# Patient Record
Sex: Female | Born: 1990 | Race: White | Hispanic: No | Marital: Married | Smoking: Never smoker
Health system: Southern US, Community
[De-identification: ages and names within clinical notes are randomized; demographics above are authoritative.]

## PROBLEM LIST (undated history)

## (undated) DIAGNOSIS — R51 Headache: Secondary | ICD-10-CM

## (undated) DIAGNOSIS — F329 Major depressive disorder, single episode, unspecified: Secondary | ICD-10-CM

## (undated) DIAGNOSIS — G4700 Insomnia, unspecified: Secondary | ICD-10-CM

## (undated) DIAGNOSIS — G43009 Migraine without aura, not intractable, without status migrainosus: Secondary | ICD-10-CM

## (undated) DIAGNOSIS — F410 Panic disorder [episodic paroxysmal anxiety] without agoraphobia: Secondary | ICD-10-CM

## (undated) DIAGNOSIS — T8859XA Other complications of anesthesia, initial encounter: Secondary | ICD-10-CM

## (undated) DIAGNOSIS — H9319 Tinnitus, unspecified ear: Secondary | ICD-10-CM

## (undated) DIAGNOSIS — Z9889 Other specified postprocedural states: Secondary | ICD-10-CM

## (undated) DIAGNOSIS — F419 Anxiety disorder, unspecified: Secondary | ICD-10-CM

## (undated) DIAGNOSIS — L659 Nonscarring hair loss, unspecified: Secondary | ICD-10-CM

## (undated) DIAGNOSIS — E282 Polycystic ovarian syndrome: Secondary | ICD-10-CM

## (undated) DIAGNOSIS — G43109 Migraine with aura, not intractable, without status migrainosus: Secondary | ICD-10-CM

## (undated) DIAGNOSIS — J40 Bronchitis, not specified as acute or chronic: Secondary | ICD-10-CM

## (undated) DIAGNOSIS — T7840XA Allergy, unspecified, initial encounter: Secondary | ICD-10-CM

## (undated) DIAGNOSIS — K649 Unspecified hemorrhoids: Secondary | ICD-10-CM

## (undated) DIAGNOSIS — R112 Nausea with vomiting, unspecified: Secondary | ICD-10-CM

## (undated) DIAGNOSIS — K219 Gastro-esophageal reflux disease without esophagitis: Secondary | ICD-10-CM

## (undated) DIAGNOSIS — K429 Umbilical hernia without obstruction or gangrene: Secondary | ICD-10-CM

## (undated) DIAGNOSIS — K900 Celiac disease: Secondary | ICD-10-CM

## (undated) DIAGNOSIS — T4145XA Adverse effect of unspecified anesthetic, initial encounter: Secondary | ICD-10-CM

## (undated) DIAGNOSIS — D849 Immunodeficiency, unspecified: Secondary | ICD-10-CM

## (undated) DIAGNOSIS — F909 Attention-deficit hyperactivity disorder, unspecified type: Secondary | ICD-10-CM

## (undated) HISTORY — PX: WISDOM TOOTH EXTRACTION: SHX21

## (undated) HISTORY — DX: Anxiety disorder, unspecified: F41.9

## (undated) HISTORY — DX: Migraine with aura, not intractable, without status migrainosus: G43.109

## (undated) HISTORY — PX: LEFT OOPHORECTOMY: SHX1961

## (undated) HISTORY — PX: HERNIA REPAIR: SHX51

## (undated) HISTORY — DX: Allergy, unspecified, initial encounter: T78.40XA

## (undated) HISTORY — DX: Headache: R51

## (undated) HISTORY — DX: Major depressive disorder, single episode, unspecified: F32.9

## (undated) HISTORY — DX: Celiac disease: K90.0

---
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
## 2015-12-11 DIAGNOSIS — F419 Anxiety disorder, unspecified: Secondary | ICD-10-CM | POA: Insufficient documentation

## 2015-12-11 DIAGNOSIS — F909 Attention-deficit hyperactivity disorder, unspecified type: Secondary | ICD-10-CM | POA: Insufficient documentation

## 2016-02-13 ENCOUNTER — Encounter (HOSPITAL_COMMUNITY): Payer: Self-pay

## 2016-02-13 ENCOUNTER — Emergency Department (HOSPITAL_COMMUNITY): Admission: EM | Admit: 2016-02-13 | Discharge: 2016-02-13 | Discharge status: Absent without leave | Payer: Self-pay

## 2016-02-13 DIAGNOSIS — K625 Hemorrhage of anus and rectum: Secondary | ICD-10-CM | POA: Insufficient documentation

## 2016-02-13 LAB — CBC
HEMATOCRIT: 38.8 % (ref 36.0–46.0)
Hemoglobin: 13.3 g/dL (ref 12.0–15.0)
MCH: 30.2 pg (ref 26.0–34.0)
MCHC: 34.3 g/dL (ref 30.0–36.0)
MCV: 88.2 fL (ref 78.0–100.0)
Platelets: 298 10*3/uL (ref 150–400)
RBC: 4.4 MIL/uL (ref 3.87–5.11)
RDW: 13 % (ref 11.5–15.5)
WBC: 8.3 10*3/uL (ref 4.0–10.5)

## 2016-02-13 LAB — COMPREHENSIVE METABOLIC PANEL
ALBUMIN: 4.7 g/dL (ref 3.5–5.0)
ALK PHOS: 50 U/L (ref 38–126)
ALT: 22 U/L (ref 14–54)
ANION GAP: 9 (ref 5–15)
AST: 27 U/L (ref 15–41)
BILIRUBIN TOTAL: 0.7 mg/dL (ref 0.3–1.2)
BUN: 10 mg/dL (ref 6–20)
CALCIUM: 9.7 mg/dL (ref 8.9–10.3)
CO2: 24 mmol/L (ref 22–32)
CREATININE: 0.67 mg/dL (ref 0.44–1.00)
Chloride: 98 mmol/L — ABNORMAL LOW (ref 101–111)
GFR calc Af Amer: >60 mL/min (ref >60)
GFR calc non Af Amer: >60 mL/min (ref >60)
GLUCOSE: 87 mg/dL (ref 65–99)
Potassium: 3.7 mmol/L (ref 3.5–5.1)
Sodium: 131 mmol/L — ABNORMAL LOW (ref 135–145)
TOTAL PROTEIN: 7.6 g/dL (ref 6.5–8.1)

## 2016-02-13 NOTE — ED Triage Notes (Addendum)
Pt presents with rectal bleeding and weakness x 3 days with soft stools. Pt states "It's like a heavy menstrual cycle but it's coming from my rectum" Pt had same problem when she was 14 and had a colonoscopy after bleeding had resolved. VSS in triage.

## 2016-02-14 ENCOUNTER — Emergency Department (HOSPITAL_COMMUNITY)
Admission: EM | Admit: 2016-02-14 | Discharge: 2016-02-14 | Disposition: A | Discharge status: Home / Self Care | Attending: Emergency Medicine | Admitting: Emergency Medicine

## 2016-02-14 DIAGNOSIS — K625 Hemorrhage of anus and rectum: Secondary | ICD-10-CM

## 2016-02-14 HISTORY — DX: Umbilical hernia without obstruction or gangrene: K42.9

## 2016-02-14 LAB — POC URINE PREG, ED: PREG TEST UR: NEGATIVE

## 2016-02-14 LAB — POC OCCULT BLOOD, ED: FECAL OCCULT BLD: NEGATIVE

## 2016-02-14 NOTE — Discharge Instructions (Signed)
You were seen in the emergency department for rectal bleeding. Her vital signs today were normal. Your hemoglobin was normal at 13.3. You did not have any blood in your rectum or stool in the emergency department. Your pregnancy test was negative. I recommend close follow-up with a gastroenterologist as you need a colonoscopy as an outpatient. Please call to schedule this appointment. I recommend eating a bland, high fiber diet over the next several weeks. If you ever feel like you're constipated, I recommend you take Colace 100 mg twice a day to keep your stool soft. Please increase your water intake. If you ever develop fever I will 100.4 higher, significant abdominal pain that does not resolve, have black and tarry stools, begin vomiting blood, feel short of breath or lightheaded like he may pass out, please return to the hospital.

## 2016-02-14 NOTE — ED Provider Notes (Signed)
By signing my name below, I, Freida BusmanDiana Omoyeni, attest that this documentation has been prepared under the direction and in the presence of Kristen N Ward, DO . Electronically Signed: Freida Busmaniana Omoyeni, Scribe. 02/14/2016. 2:45 AM.  TIME SEEN: 2:32 AM  CHIEF COMPLAINT:  Chief Complaint  Patient presents with  . Rectal Bleeding    HPI:  HPI Comments:  Charlene Griffin is a 26 y.o. female who presents to the Emergency Department complaining of rectal bleeding x 3 days. Pt has a h/o same when she was 26 years old and had a colonoscopy 3-4 months after that incident that was negative. She states it was thought to have been caused by aa anal fissure. Pt notes she has frequently had blood in her stool over the last 3-4 months and in the last few days she has noticed blood in her underwear with flatulence. She describes both bright red blood and "merlot" colored blood, which she first noticed yesterday (02/13/16). Pt notes associated fatigue x ~ 4 months, and an episode of right lower quadrant abdominal pain 2 nights ago that was severe but resolved on its own. No abdominal pain currently. She states that pain felt like pain she had with past hernia. Pt denies lightheadedness, abnormal vaginal bleeding, fever, and SOB.  No current use of anticoagulants or antiplatelets. No personal history or family hx of Crohn's or ulcerative colitis. She also denies anal intercourse and insertion of foreign bodies in the rectum.    ROS: See HPI Constitutional: no fever  Eyes: no drainage  ENT: no runny nose   Cardiovascular:  no chest pain  Resp: no SOB  GI: no vomiting GU: no dysuria Integumentary: no rash  Allergy: no hives  Musculoskeletal: no leg swelling  Neurological: no slurred speech ROS otherwise negative  PAST MEDICAL HISTORY/PAST SURGICAL HISTORY:  Past Medical History:  Diagnosis Date  . Umbilical hernia     MEDICATIONS:  Prior to Admission medications   Not on File    ALLERGIES:  Allergies   Allergen Reactions  . Botox [Botulinum Toxin Type A] Rash and Other (See Comments)    Mood changes   . Norco [Hydrocodone-Acetaminophen] Nausea And Vomiting    SOCIAL HISTORY:  Social History  Substance Use Topics  . Smoking status: Never Smoker  . Smokeless tobacco: Not on file  . Alcohol use Yes     Comment: occ    FAMILY HISTORY: History reviewed. No pertinent family history.  EXAM: BP (!) 135/109   Pulse 103   Temp 97.7 F (36.5 C) (Oral)   Resp 22   Ht 5\' 6"  (1.676 m)   Wt 167 lb (75.8 kg)   SpO2 100%   BMI 26.95 kg/m  CONSTITUTIONAL: Alert and oriented and responds appropriately to questions. Well-appearing; well-nourished, Extremely well-appearing, nontoxic HEAD: Normocephalic EYES: Conjunctivae clear, PERRL, EOMI ENT: normal nose; no rhinorrhea; moist mucous membranes NECK: Supple, no meningismus, no nuchal rigidity, no LAD  CARD: RRR; S1 and S2 appreciated; no murmurs, no clicks, no rubs, no gallops RESP: Normal chest excursion without splinting or tachypnea; breath sounds clear and equal bilaterally; no wheezes, no rhonchi, no rales, no hypoxia or respiratory distress, speaking full sentences ABD/GI: Normal bowel sounds; non-distended; soft, non-tender, no rebound, no guarding, no peritoneal signs, no hepatosplenomegaly RECTAL:  Normal rectal tone, no gross blood or melena, guaiac negative,  nontender rectal exam, no fecal impaction. 2 small non- thrombosed non bleeding hemorrhoids. Chaperone (scribe) was present for exam which was performed with no  discomfort or complications.  BACK:  The back appears normal and is non-tender to palpation, there is no CVA tenderness EXT: Normal ROM in all joints; non-tender to palpation; no edema; normal capillary refill; no cyanosis, no calf tenderness or swelling    SKIN: Normal color for age and race; warm; no rash NEURO: Moves all extremities equally, normal speech, normal gait PSYCH: The patient's mood and manner are  appropriate. Grooming and personal hygiene are appropriate.  MEDICAL DECISION MAKING: Patient here with reports of intermittent rectal bleeding for the past 4 months worse over the past 3 days. Has felt fatigued but no other associated symptoms. He is hemodynamically stable. Hemoglobin 13.3. Abdominal exam benign. She is Hemoccult negative with normal rectal exam. No fecal impaction. Nontender rectal exam. 2 nonthrombosed external hemorrhoids present that are nonbleeding. I do not feel she needs admission to the hospital, blood transfusion while emergent colonoscopy or abdominal imaging. I recommended close outpatient follow-up with a gastroenterologist. States her gastroenterologist previously was in [place]. Given information for on-call [facility].  Discussed at length plating return precautions. She verbalized understanding and discomfort with this plan.  At this time, I do not feel there is any life-threatening condition present. I have reviewed and discussed all results (EKG, imaging, lab, urine as appropriate) and exam findings with patient/family. I have reviewed nursing notes and appropriate previous records.  I feel the patient is safe to be discharged home without further emergent workup and can continue workup as an outpatient as needed. Discussed usual and customary return precautions. Patient/family verbalize understanding and are comfortable with this plan.  Outpatient follow-up has been provided. All questions have been answered.   I personally performed the services described in this documentation, which was scribed in my presence. The recorded information has been reviewed and is accurate.     Layla Maw Ward, DO 02/14/16 [id number]

## 2016-02-16 ENCOUNTER — Encounter: Payer: Self-pay | Admitting: Gastroenterology

## 2016-02-16 ENCOUNTER — Ambulatory Visit (INDEPENDENT_AMBULATORY_CARE_PROVIDER_SITE_OTHER): Admitting: Gastroenterology

## 2016-02-16 VITALS — BP 100/70 | HR 86 | Ht 66.0 in | Wt 170.0 lb

## 2016-02-16 DIAGNOSIS — K625 Hemorrhage of anus and rectum: Secondary | ICD-10-CM | POA: Insufficient documentation

## 2016-02-16 DIAGNOSIS — K6289 Other specified diseases of anus and rectum: Secondary | ICD-10-CM | POA: Insufficient documentation

## 2016-02-16 DIAGNOSIS — K602 Anal fissure, unspecified: Secondary | ICD-10-CM | POA: Insufficient documentation

## 2016-02-16 MED ORDER — AMBULATORY NON FORMULARY MEDICATION: 0.1250 mg | Freq: Three times a day (TID) | 2 refills | Status: DC

## 2016-02-16 NOTE — Patient Instructions (Signed)
We have sent a prescription for nitroglycerin 0.125% gel to [facility]. You should apply a pea size amount to your rectum with a gloved finger up to the first knuckle three times daily x 6-8 weeks.  [facility]'s information is below: Address: [address], [place], [place] [address]  [phone]   Keep stools soft.  We have given you a handout on Sitz baths.

## 2016-02-16 NOTE — Progress Notes (Addendum)
02/16/2016 Charlene Griffin [id number] Sep 05, 1990   HISTORY OF PRESENT ILLNESS:  This is a 26 year old female who is new to our office. She was referred to our office by Dr. Zettie Cooley from the emergency department. She was seen in the emergency department 2 days ago for complaints of rectal bleeding. She tells me that she has a history of rectal bleeding dating back to 10 or 11 years ago. She says she had a colonoscopy for an episode of rectal bleeding 10 years ago in [place] that was normal. She has intermittently seen bright red blood with bowel movements since that time. This weekend, however, she had rectal bleeding also described as bright red, but it occurred for 3 days in a row. This occurred with bowel movements but then also when she would pass gas she would pass a small amount of blood as well. She also complains of anal/rectal pain with harder stools. She says that she does not tend to be constipated but every once in a while if she does not eat enought fruit then she will have some mild constipation. She denies any abdominal pain. In the ER she was Hemoccult negative. Hemoglobin is normal at 13.3 grams.  Has family history of colon cancer in maternal grandfather.   Past Medical History:  Diagnosis Date  . Anxiety   . Chronic headaches   . Depression   . Umbilical hernia    Past Surgical History:  Procedure Laterality Date  . HERNIA REPAIR      reports that she has never smoked. She has never used smokeless tobacco. She reports that she drinks alcohol. She reports that she uses drugs, including Marijuana. family history includes Colon cancer in her maternal grandfather. Allergies  Allergen Reactions  . Botox [Botulinum Toxin Type A] Rash and Other (See Comments)    Mood changes   . Norco [Hydrocodone-Acetaminophen] Nausea And Vomiting      Outpatient Encounter Prescriptions as of 02/16/2016  Medication Sig  . amitriptyline (ELAVIL) 50 MG tablet Take 50 mg by  mouth 2 (two) times daily.  Marland Kitchen escitalopram (LEXAPRO) 20 MG tablet Take 30 mg by mouth daily.  Marland Kitchen levonorgestrel (MIRENA) 20 MCG/24HR IUD 1 each by Intrauterine route once.  Marland Kitchen tiZANidine (ZANAFLEX) 2 MG tablet Take 2 mg by mouth every 6 (six) hours as needed for muscle spasms.   No facility-administered encounter medications on file as of 02/16/2016.      REVIEW OF SYSTEMS  : All other systems reviewed and negative except where noted in the History of Present Illness.   PHYSICAL EXAM: BP 100/70   Pulse 86   Ht 5\' 6"  (1.676 m)   Wt 170 lb (77.1 kg)   SpO2 99%   BMI 27.44 kg/m  General: Well developed white female in no acute distress Head: Normocephalic and atraumatic Eyes:  Sclerae anicteric, conjunctiva pink. Ears: Normal auditory acuity Lungs: Clear throughout to auscultation Heart: Regular rate and rhythm Abdomen: Soft, non-distended. Normal bowel sounds.  Non-tender. Rectal:  Perianal skin tag noted.  There was also a very tender posterior anal fissure clearly seen on exam.  DRE tolerated and no masses felt.  Was hemoccult negative. Musculoskeletal: Symmetrical with no gross deformities  Skin: No lesions on visible extremities Extremities: No edema  Neurological: Alert oriented x 4, grossly non-focal Psychological:  Alert and cooperative. Normal mood and affect  ASSESSMENT AND PLAN: -Anal fissure:  Most certainly the cause of her rectal bleeding and her anal pain.  Will treat with nitroglycerin gel 0.125 mg TID for 6-8 weeks.  Advised to keep stools soft.  Sitz baths if desired.  Will follow-up with me in about 6-8 weeks as well.   CC:  No ref. provider found   Thank you for sending this case to me. I have reviewed the entire note, and the outlined plan seems appropriate. Recticare (lidocaine) OTC ointment would help as well so she does not avoid BMs due to pain.  Amada JupiterHenry Danis, MD

## 2016-09-18 ENCOUNTER — Encounter (HOSPITAL_COMMUNITY): Payer: Self-pay | Admitting: Emergency Medicine

## 2016-09-18 ENCOUNTER — Emergency Department (HOSPITAL_COMMUNITY)
Admission: EM | Admit: 2016-09-18 | Discharge: 2016-09-18 | Disposition: A | Discharge status: Home / Self Care | Attending: Emergency Medicine | Admitting: Emergency Medicine

## 2016-09-18 DIAGNOSIS — R197 Diarrhea, unspecified: Secondary | ICD-10-CM | POA: Insufficient documentation

## 2016-09-18 DIAGNOSIS — R519 Headache, unspecified: Secondary | ICD-10-CM

## 2016-09-18 DIAGNOSIS — R112 Nausea with vomiting, unspecified: Secondary | ICD-10-CM | POA: Insufficient documentation

## 2016-09-18 DIAGNOSIS — L659 Nonscarring hair loss, unspecified: Secondary | ICD-10-CM

## 2016-09-18 DIAGNOSIS — R51 Headache: Secondary | ICD-10-CM | POA: Diagnosis not present

## 2016-09-18 DIAGNOSIS — Z79899 Other long term (current) drug therapy: Secondary | ICD-10-CM | POA: Diagnosis not present

## 2016-09-18 LAB — COMPREHENSIVE METABOLIC PANEL
ALBUMIN: 5 g/dL (ref 3.5–5.0)
ALK PHOS: 75 U/L (ref 38–126)
ALT: 20 U/L (ref 14–54)
ANION GAP: 6 (ref 5–15)
AST: 27 U/L (ref 15–41)
BILIRUBIN TOTAL: 0.7 mg/dL (ref 0.3–1.2)
BUN: 15 mg/dL (ref 6–20)
CALCIUM: 10 mg/dL (ref 8.9–10.3)
CO2: 29 mmol/L (ref 22–32)
Chloride: 101 mmol/L (ref 101–111)
Creatinine, Ser: 0.66 mg/dL (ref 0.44–1.00)
GFR calc non Af Amer: >60 mL/min (ref >60)
Glucose, Bld: 96 mg/dL (ref 65–99)
POTASSIUM: 4.2 mmol/L (ref 3.5–5.1)
SODIUM: 136 mmol/L (ref 135–145)
TOTAL PROTEIN: 9 g/dL — AB (ref 6.5–8.1)

## 2016-09-18 LAB — CBC
HEMATOCRIT: 41 % (ref 36.0–46.0)
HEMOGLOBIN: 14.2 g/dL (ref 12.0–15.0)
MCH: 30.3 pg (ref 26.0–34.0)
MCHC: 34.6 g/dL (ref 30.0–36.0)
MCV: 87.6 fL (ref 78.0–100.0)
Platelets: 348 10*3/uL (ref 150–400)
RBC: 4.68 MIL/uL (ref 3.87–5.11)
RDW: 12.8 % (ref 11.5–15.5)
WBC: 9.6 10*3/uL (ref 4.0–10.5)

## 2016-09-18 LAB — LIPASE, BLOOD: Lipase: 21 U/L (ref 11–51)

## 2016-09-18 LAB — I-STAT BETA HCG BLOOD, ED (MC, WL, AP ONLY): I-stat hCG, quantitative: <5 m[IU]/mL (ref <5)

## 2016-09-18 LAB — URINALYSIS, ROUTINE W REFLEX MICROSCOPIC
Bilirubin Urine: NEGATIVE
GLUCOSE, UA: NEGATIVE mg/dL
Hgb urine dipstick: NEGATIVE
KETONES UR: 5 mg/dL — AB
LEUKOCYTES UA: NEGATIVE
NITRITE: NEGATIVE
PH: 5 (ref 5.0–8.0)
PROTEIN: NEGATIVE mg/dL
Specific Gravity, Urine: 1.029 (ref 1.005–1.030)

## 2016-09-18 LAB — T4, FREE: FREE T4: 0.85 ng/dL (ref 0.61–1.12)

## 2016-09-18 LAB — TSH: TSH: 2.252 u[IU]/mL (ref 0.350–4.500)

## 2016-09-18 MED ORDER — SODIUM CHLORIDE 0.9 % IV BOLUS (SEPSIS)
1000.0000 mL | Freq: Once | INTRAVENOUS | Status: AC
  Administered 2016-09-18: 1000 mL via INTRAVENOUS

## 2016-09-18 MED ORDER — ONDANSETRON 4 MG PO TBDP
ORAL_TABLET | ORAL | Status: AC
  Filled 2016-09-18: qty 1

## 2016-09-18 MED ORDER — KETOROLAC TROMETHAMINE 30 MG/ML IJ SOLN
30.0000 mg | Freq: Once | INTRAMUSCULAR | Status: AC
  Administered 2016-09-18: 30 mg via INTRAVENOUS
  Filled 2016-09-18: qty 1

## 2016-09-18 MED ORDER — METOCLOPRAMIDE HCL 5 MG/ML IJ SOLN
10.0000 mg | Freq: Once | INTRAMUSCULAR | Status: AC
  Administered 2016-09-18: 10 mg via INTRAVENOUS
  Filled 2016-09-18: qty 2

## 2016-09-18 MED ORDER — DIPHENHYDRAMINE HCL 50 MG/ML IJ SOLN
12.5000 mg | Freq: Once | INTRAMUSCULAR | Status: AC
  Administered 2016-09-18: 12.5 mg via INTRAVENOUS
  Filled 2016-09-18: qty 1

## 2016-09-18 MED ORDER — ONDANSETRON 4 MG PO TBDP
4.0000 mg | ORAL_TABLET | Freq: Once | ORAL | Status: AC | PRN
  Administered 2016-09-18: 4 mg via ORAL

## 2016-09-18 NOTE — ED Triage Notes (Signed)
Patient has PMH migraine and today while driving had sudden onset of headache behind left eye with n/v/d and light sensitivity.  Patient reports that she was recently seen due to hair falling out and could be thyroid problem but hasnt had testing done on that yet.

## 2016-09-18 NOTE — Discharge Instructions (Signed)
Labs today look good. I recommend that you follow-up with one of the neurology offices in town for ongoing management of her migraines. Can also follow-up with her primary care doctor. Return to the ED for new or worsening symptoms.

## 2016-09-18 NOTE — ED Provider Notes (Signed)
[facility]-EMERGENCY DEPT Provider Note   CSN: [id number] Arrival date & time: 09/18/16  1404     History   Chief Complaint Chief Complaint  Patient presents with  . Headache  . Emesis  . Diarrhea    HPI Charlene Griffin is a 26 y.o. female.  The history is provided by the patient and medical records.  Headache   Associated symptoms include nausea and vomiting.  Emesis   Associated symptoms include diarrhea and headaches.  Diarrhea   Associated symptoms include vomiting and headaches.     26 year old female with history of anxiety, chronic headaches, depression, presenting to the ED with headache, nausea, vomiting, diarrhea. Reports she has long-standing family and personal hx of migraine headaches.  States today while she was driving around 40:9811:30 AM she developed headache behind her left eye and radiating into the left face. States headache has been progressively worsening throughout the day.  She does report some photophobia. States soon after the headaches she started having nausea, vomiting, and diarrhea which is somewhat atypical for her. She denies any abdominal pain. She denies any dizziness, confusion, tinnitus, changes in speech, numbness, weakness, gait disturbance or blurred vision. She is not currently on anticoagulation. States she has tried multiple medications for migraines in the past including Botox injections, triptan, Topamax, magnesium, and other over-the-counter medications without relief.  States she did take a dose of her sumatriptan today, no change.  Patient also with some complaints of hair thinning and loss over the past few months. 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 She points to a patch on the top of her head where she is clearly lost hair. There are no signs of infection or ringworm. He states she does report some trouble with sleep and some increased  irritability.  Past Medical History:  Diagnosis Date  . Anxiety   . Chronic headaches   . Depression   . Umbilical hernia     Patient Active Problem List   Diagnosis Date Noted  . Anal fissure 02/16/2016  . Rectal bleeding 02/16/2016  . Anal pain 02/16/2016    Past Surgical History:  Procedure Laterality Date  . HERNIA REPAIR      OB History    No data available       Home Medications    Prior to Admission medications   Medication Sig Start Date End Date Taking? Authorizing Provider  amitriptyline (ELAVIL) 50 MG tablet Take 125 mg by mouth at bedtime.    Yes [provider]  escitalopram (LEXAPRO) 20 MG tablet Take 30 mg by mouth daily.   Yes [provider]  levonorgestrel (MIRENA) 20 MCG/24HR IUD 1 each by Intrauterine route once.   Yes [provider]  SUMAtriptan (IMITREX) 50 MG tablet Take 1 tablet (50mg  total) by mouth may repeat times 1 in 2 hours as needed for migraines.  Max two in a 24 hour period. 06/26/16  Yes [provider]  AMBULATORY NON FORMULARY MEDICATION Place 0.125 mg rectally 3 (three) times daily. Medication Name: Nitroglycerin gel 02/16/16   Zehr, Princella PellegriniJessica D, PA-C    Family History Family History  Problem Relation Age of Onset  . Colon cancer Maternal Grandfather        passed away 2014    Social History Social History  Substance Use Topics  . Smoking status: Never Smoker  . Smokeless tobacco: Never Used  .  Alcohol use Yes     Comment: occ     Allergies   Botox [botulinum toxin type a] and Norco [hydrocodone-acetaminophen]   Review of Systems Review of Systems  Gastrointestinal: Positive for diarrhea, nausea and vomiting.  Neurological: Positive for headaches.  All other systems reviewed and are negative.    Physical Exam Updated Vital Signs BP 117/72   Pulse 86   Temp 98 F (36.7 C)   Resp 20   Ht  (1.676 m)   Wt 78 kg (172 lb)   SpO2 99%   BMI 27.76 kg/m   Physical Exam    Constitutional: She is oriented to person, place, and time. She appears well-developed and well-nourished. No distress.  Smiling, laughing and joking during exam  HENT:  Head: Normocephalic and atraumatic.  Right Ear: External ear normal.  Left Ear: External ear normal.  Mouth/Throat: Oropharynx is clear and moist.  Eyes: Pupils are equal, round, and reactive to light. Conjunctivae and EOM are normal.  Neck: Normal range of motion and full passive range of motion without pain. Neck supple. No neck rigidity.  No rigidity, no meningismus  Cardiovascular: Normal rate, regular rhythm and normal heart sounds.   No murmur heard. Pulmonary/Chest: Effort normal and breath sounds normal. No respiratory distress. She has no wheezes. She has no rhonchi.  Abdominal: Soft. Bowel sounds are normal. There is no tenderness. There is no rebound and no guarding.  Musculoskeletal: Normal range of motion. She exhibits no edema.  Neurological: She is alert and oriented to person, place, and time. She has normal strength. She displays no tremor. No cranial nerve deficit or sensory deficit. She displays no seizure activity.  AAOx3, answering questions and following commands appropriately; equal strength UE and LE bilaterally; CN grossly intact; moves all extremities appropriately without ataxia; no focal neuro deficits or facial asymmetry appreciated  Skin: Skin is warm and dry. No rash noted. She is not diaphoretic.  Psychiatric: She has a normal mood and affect. Her behavior is normal. Thought content normal.  Nursing note and vitals reviewed.    ED Treatments / Results  Labs (all labs ordered are listed, but only abnormal results are displayed) Labs Reviewed  COMPREHENSIVE METABOLIC PANEL - Abnormal; Notable for the following:       Result Value   Total Protein 9.0 (*)    All other components within normal limits  URINALYSIS, ROUTINE W REFLEX MICROSCOPIC - Abnormal; Notable for the following:     APPearance HAZY (*)    Ketones, ur 5 (*)    All other components within normal limits  LIPASE, BLOOD  CBC  TSH  T4, FREE  I-STAT BETA HCG BLOOD, ED ([facility], [facility], [facility] ONLY)    EKG  EKG Interpretation None       Radiology No results found.  Procedures Procedures (including critical care time)  Medications Ordered in ED Medications  ondansetron (ZOFRAN-ODT) disintegrating tablet 4 mg (4 mg Oral Given 09/18/16 1417)  sodium chloride 0.9 % bolus 1,000 mL (0 mLs Intravenous Stopped 09/18/16 2146)  ketorolac (TORADOL) 30 MG/ML injection 30 mg (30 mg Intravenous Given 09/18/16 2034)  metoCLOPramide (REGLAN) injection 10 mg (10 mg Intravenous Given 09/18/16 2034)  diphenhydrAMINE (BENADRYL) injection 12.5 mg (12.5 mg Intravenous Given 09/18/16 2036)     Initial Impression / Assessment and Plan / ED Course  I have reviewed the triage vital signs and the nursing notes.  Pertinent labs & imaging results that were available during my care of  the patient were reviewed by me and considered in my medical decision making (see chart for details).  26 y.o. F here with headache, N/V/D.  Hx of migraines, current headache symptoms are suspicious for such.  N/V/D without any current abdominal pain.  She is afebrile and nontoxic. Abdomen is soft and benign. Neurologic exam is nonfocal. No signs or symptoms concerning for meningitis. Screening lab work overall reassuring.  Patient treated with migraine cocktail, has been resting comfortably, reports headache improved. Patient also had some concerns over her loss the past several weeks as well as some fatigue and irritability. Thyroid studies were sent and TSH is normal. T4 is pending. Patient has had multiple prior treatments for chronic migraines in the past without success. Will refer to neurology for follow-up on migraines-- ambulatory referral placed.   Also recommended to follow-up with PCP about her hair loss/fatigue.  Discussed plan with patient, she  acknowledged understanding and agreed with plan of care.  Return precautions given for new or worsening symptoms.  Final Clinical Impressions(s) / ED Diagnoses   Final diagnoses:  Bad headache  Nausea vomiting and diarrhea  Hair loss    New Prescriptions Discharge Medication List as of 09/18/2016  9:32 PM       Garlon Hatchet, PA-C 09/18/16 2311    Bethann Berkshire, MD 09/18/16 2312

## 2016-09-21 ENCOUNTER — Ambulatory Visit: Admitting: Neurology

## 2017-02-08 DIAGNOSIS — E049 Nontoxic goiter, unspecified: Secondary | ICD-10-CM | POA: Insufficient documentation

## 2017-02-08 DIAGNOSIS — L639 Alopecia areata, unspecified: Secondary | ICD-10-CM | POA: Insufficient documentation

## 2017-09-21 LAB — GLUCOSE, POCT (MANUAL RESULT ENTRY): POC Glucose: 78 mg/dl (ref 70–99)

## 2018-04-04 DIAGNOSIS — H9313 Tinnitus, bilateral: Secondary | ICD-10-CM | POA: Insufficient documentation

## 2018-04-04 DIAGNOSIS — J3089 Other allergic rhinitis: Secondary | ICD-10-CM | POA: Insufficient documentation

## 2018-10-03 ENCOUNTER — Encounter: Payer: Self-pay | Admitting: Obstetrics and Gynecology

## 2018-10-17 ENCOUNTER — Other Ambulatory Visit: Payer: Self-pay

## 2018-10-22 ENCOUNTER — Encounter: Payer: Self-pay | Admitting: Obstetrics and Gynecology

## 2018-10-22 ENCOUNTER — Other Ambulatory Visit: Payer: Self-pay

## 2018-10-22 ENCOUNTER — Ambulatory Visit: Admitting: Obstetrics and Gynecology

## 2018-10-22 VITALS — BP 104/62 | HR 64 | Temp 97.1°F | Wt 162.2 lb

## 2018-10-22 DIAGNOSIS — N9412 Deep dyspareunia: Secondary | ICD-10-CM | POA: Diagnosis not present

## 2018-10-22 DIAGNOSIS — N83202 Unspecified ovarian cyst, left side: Secondary | ICD-10-CM

## 2018-10-22 DIAGNOSIS — R102 Pelvic and perineal pain: Secondary | ICD-10-CM | POA: Diagnosis not present

## 2018-10-22 DIAGNOSIS — L680 Hirsutism: Secondary | ICD-10-CM

## 2018-10-22 DIAGNOSIS — G43109 Migraine with aura, not intractable, without status migrainosus: Secondary | ICD-10-CM | POA: Insufficient documentation

## 2018-10-22 DIAGNOSIS — G43009 Migraine without aura, not intractable, without status migrainosus: Secondary | ICD-10-CM

## 2018-10-22 DIAGNOSIS — N913 Primary oligomenorrhea: Secondary | ICD-10-CM

## 2018-10-22 DIAGNOSIS — Z113 Encounter for screening for infections with a predominantly sexual mode of transmission: Secondary | ICD-10-CM

## 2018-10-22 MED ORDER — DOXYCYCLINE HYCLATE 100 MG PO CAPS: 100.0000 mg | ORAL_CAPSULE | Freq: Two times a day (BID) | ORAL | 0 refills | Status: DC

## 2018-10-22 MED ORDER — CEFTRIAXONE SODIUM 250 MG IJ SOLR
250.0000 mg | Freq: Once | INTRAMUSCULAR | Status: AC
  Administered 2018-10-22: 09:00:00 250 mg via INTRAMUSCULAR

## 2018-10-22 NOTE — Progress Notes (Signed)
28 y.o. G0P0000 Married White or Caucasian Not Hispanic or Latino female here for cyst on ovary and pain with intercourse.  The patient was sent for a consultation by Dr Earney Navy for a left ovarian dermoid cyst. The cyst was first noted on a CT scan on 07/22/17, it was described as a 2 cm left ovarian dermoid. She then had an ultrasound on 09/08/18 for pain with intercourse, IUD with question of a misplaced arm. 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   Her mirena IUD was removed and a Nexplanon was placed on 09/15/18. She hasn't had a cycle in years, just occasional spotting.   Menarche at 5, never regular. She would cycle every 2-3 months. She got a mirena when she was ~28 years old, stopped having cycles.  Denies hirsutism (family has a lot of hair growth, nothing worrisome), no acne, no galactorrhea. H/O alopecia.  She had a normal TSH at some point.   Sexually active, together x 6 years, married. She has always had pain with intercourse. They use lubrication, the pain is deep, ~85% of the time, used to be 100% of the time. Some positions are impossible, others aren't as bad. The pain is stabbing.     No LMP recorded (lmp unknown). Patient has had an implant.          Sexually active: Yes.    The current method of family planning is nexplanon.    Exercising: Yes.    walking Smoker:  no  Health Maintenance: Pap:  03/18/2018 WNL History of abnormal Pap:  no TDaP:  05-17-2016 Gardasil: Unsure   reports that she has been smoking cigarettes. She has never used smokeless tobacco. She reports current alcohol use. She reports that she does not use drugs. 5 drinks a week. 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   Past Medical History:  Diagnosis Date  . Anxiety   . Chronic headaches   . Depression   . Migraine without aura   . Umbilical hernia     Past Surgical History:  Procedure Laterality Date  . HERNIA REPAIR      Current Outpatient Medications   Medication Sig Dispense Refill  . etonogestrel (NEXPLANON) 68 MG IMPL implant 68 mg by Subdermal route once.    . Rimegepant Sulfate 75 MG TBDP Take 1 tablet by mouth as needed.    . SUMAtriptan (IMITREX) 50 MG tablet Take 1 tablet (50mg  total) by mouth may repeat times 1 in 2 hours as needed for migraines.  Max two in a 24 hour period.    . verapamil (CALAN) 40 MG tablet Take 1 tablet by mouth as needed.    Marland Kitchen amitriptyline (ELAVIL) 50 MG tablet Take 125 mg by mouth at bedtime.      No current facility-administered medications for this visit.     Family History  Problem Relation Age of Onset  . Colon cancer Maternal Grandfather        passed away 05/17/12    Review of Systems  Constitutional: Negative.   HENT: Negative.   Eyes: Negative.   Respiratory: Negative.   Cardiovascular: Negative.   Gastrointestinal: Negative.   Endocrine: Negative.   Genitourinary: Positive for dyspareunia.       Cyst on ovary  Musculoskeletal: Negative.   Skin: Negative.   Allergic/Immunologic: Negative.   Neurological: Negative.   Hematological: Negative.   Psychiatric/Behavioral: Negative.     Exam:   BP 104/62 (BP Location: Right  Arm, Patient Position: Sitting, Cuff Size: Normal)   Pulse 64   Temp (!) 97.1 F (36.2 C) (Temporal)   Wt 162 lb 3.2 oz (73.6 kg)   LMP  (LMP Unknown) Comment: Nexplanon  BMI 26.18 kg/m   Weight change: @WEIGHTCHANGE @ Height:      Ht Readings from Last 3 Encounters:  09/18/16 5\' 6"  (1.676 m)  02/16/16 5\' 6"  (1.676 m)  02/13/16 5\' 6"  (1.676 m)    General appearance: alert, cooperative and appears stated age Head: Normocephalic, without obvious abnormality, atraumatic Neck: no adenopathy, supple, symmetrical, trachea midline and thyroid normal to inspection and palpation Lungs: clear to auscultation bilaterally Cardiovascular: regular rate and rhythm Abdomen: soft, mildly tender in the right lateral lower quadrant and in the periumbilical region, not tender  when tensing her abdomen.  Non distended,  no masses,  no organomegaly Extremities: extremities normal, atraumatic, no cyanosis or edema Skin: Skin color, texture, turgor normal. No rashes or lesions. She does have significant hair growth on her lower abdomen.  Lymph nodes: Cervical, supraclavicular, and axillary nodes normal. No abnormal inguinal nodes palpated Neurologic: Grossly normal   Pelvic: External genitalia:  no lesions              Urethra:  normal appearing urethra with no masses, tenderness or lesions              Bartholins and Skenes: normal                 Vagina: normal appearing vagina with normal color and discharge, no lesions              Cervix: cervical motion tenderness and no lesions               Bimanual Exam:  Uterus:  uterus normal sized, mobile, very tender              Adnexa: no masses, bilaterally tender               Bladder: tender  Pelvic floor: not tender .  Chaperone was present for exam.  The patient was able to pull up images of her last ultrasound, the dermoid appears to be in the center of the ovary.   A:  2.1 cm Left ovarian dermoid, stable in size in the last year  Deep dyspareunia  FH of ovarian cancer, PGM in her 50's  Pelvic exam very tender, concerning for PID (patient doesn't feel she is at risk for STD)  H/O oligomenorrhea  Hirsutism  P:   Discussed risk of maliganant transformation of dermoids of 0.2-2%  Discussed that the dermoid is small, risk of injury to her ovary with attempt to remove it. On imaging the dermoid appears in the center of her ovary.   Discussed options for her dermoid are removal or close surveillance with removal if it is gowning or when she is done having children.   Discussed risk of torsion, if it grows   With her significant pelvic tenderness will treat for PID  Ceftriaxone 250 mg IM x 1  Doxycycline 100 mg BID x 2 weeks  F/U in one week for repeat exam  F/u u/s in 6 months to evaluate dermoid  Will  check CBC, STD testing, Prolactin, Testosterone (normal TSH last year)  CC: Dr 04/15/16 Note sent

## 2018-10-23 LAB — PROLACTIN: Prolactin: 13.8 ng/mL (ref 4.8–23.3)

## 2018-10-23 LAB — CBC
Hematocrit: 43.8 % (ref 34.0–46.6)
Hemoglobin: 14.1 g/dL (ref 11.1–15.9)
MCH: 30.3 pg (ref 26.6–33.0)
MCHC: 32.2 g/dL (ref 31.5–35.7)
MCV: 94 fL (ref 79–97)
Platelets: 338 10*3/uL (ref 150–450)
RBC: 4.65 x10E6/uL (ref 3.77–5.28)
RDW: 12.8 % (ref 11.7–15.4)
WBC: 5.6 10*3/uL (ref 3.4–10.8)

## 2018-10-23 LAB — TESTOSTERONE: Testosterone: 49 ng/dL — ABNORMAL HIGH (ref 8–48)

## 2018-10-23 LAB — HIV ANTIBODY (ROUTINE TESTING W REFLEX): HIV Screen 4th Generation wRfx: NONREACTIVE

## 2018-10-23 LAB — RPR: RPR Ser Ql: NONREACTIVE

## 2018-10-23 NOTE — Progress Notes (Signed)
GYNECOLOGY  VISIT   HPI: 28 y.o.   Married White or Caucasian Not Hispanic or Latino  female   G0P0000 with No LMP recorded (lmp unknown). Patient has had an implant.   here for 1 week recheck, she has a long h/o deep dyspareunia and was treated last week for possible PID secondary to tenderness on pelvic exam. Her cervical cultures were negative. She has no baseline bladder c/o. She has one more week of doxycycline, developed a yeast infection and was started on diflucan.  She had a mirena IUD removed last month for a malpositioned arm, removing it didn't help her deep dyspareunia. She had a nexplanon placed when the IUD was removed.  She hates the nexplanon, painful, wants it out. She would like another mirena IUD. Wouldn't remember to take OCP's, doesn't want the nuvaring.  She hasn't had a cycle is years, just occasional spotting, she does get intermittent pelvic cramping  She has a h/o oligomenorrhea and mild hirsutism, mildly elevated testosterone on lab work last week. She has questions about PCOS.    GYNECOLOGIC HISTORY: No LMP recorded (lmp unknown). Patient has had an implant. Contraception:Nexplanon Menopausal hormone therapy: None        OB History    Gravida  0   Para  0   Term  0   Preterm  0   AB  0   Living  0     SAB  0   TAB  0   Ectopic  0   Multiple  0   Live Births  0              Patient Active Problem List   Diagnosis Date Noted  . Migraine without aura   . Anal fissure 02/16/2016  . Rectal bleeding 02/16/2016  . Anal pain 02/16/2016    Past Medical History:  Diagnosis Date  . Anxiety   . Chronic headaches   . Depression   . Migraine without aura   . Umbilical hernia     Past Surgical History:  Procedure Laterality Date  . HERNIA REPAIR      Current Outpatient Medications  Medication Sig Dispense Refill  . amitriptyline (ELAVIL) 50 MG tablet Take 125 mg by mouth at bedtime.     . betamethasone valerate ointment  (VALISONE) 0.1 % Apply 1 application topically 2 (two) times daily. Use a pea sized amount. 15 g 0  . doxycycline (VIBRAMYCIN) 100 MG capsule Take 1 capsule (100 mg total) by mouth 2 (two) times daily. Take BID for 14 days.  Take with food as can cause GI distress. 28 capsule 0  . etonogestrel (NEXPLANON) 68 MG IMPL implant 68 mg by Subdermal route once.    . Rimegepant Sulfate 75 MG TBDP Take 1 tablet by mouth as needed.    . SUMAtriptan (IMITREX) 50 MG tablet Take 1 tablet (50mg  total) by mouth may repeat times 1 in 2 hours as needed for migraines.  Max two in a 24 hour period.    . verapamil (CALAN) 40 MG tablet Take 1 tablet by mouth as needed.     No current facility-administered medications for this visit.      ALLERGIES: Botox [botulinum toxin type a] and Norco [hydrocodone-acetaminophen]  Family History  Problem Relation Age of Onset  . Colon cancer Maternal Grandfather        passed away 2014    Social History   Socioeconomic History  . Marital status: Married    Spouse  name: Not on file  . Number of children: Not on file  . Years of education: Not on file  . Highest education level: Not on file  Occupational History  . Not on file  Social Needs  . Financial resource strain: Not on file  . Food insecurity    Worry: Not on file    Inability: Not on file  . Transportation needs    Medical: Not on file    Non-medical: Not on file  Tobacco Use  . Smoking status: Current Every Day Smoker    Types: Cigarettes  . Smokeless tobacco: Never Used  Substance and Sexual Activity  . Alcohol use: Yes    Comment: socially  . Drug use: Never  . Sexual activity: Yes    Birth control/protection: Implant  Lifestyle  . Physical activity    Days per week: Not on file    Minutes per session: Not on file  . Stress: Not on file  Relationships  . Social Musician on phone: Not on file    Gets together: Not on file    Attends religious service: Not on file    Active  member of club or organization: Not on file    Attends meetings of clubs or organizations: Not on file    Relationship status: Not on file  . Intimate partner violence    Fear of current or ex partner: Not on file    Emotionally abused: Not on file    Physically abused: Not on file    Forced sexual activity: Not on file  Other Topics Concern  . Not on file  Social History Narrative  . Not on file    Review of Systems  Constitutional: Negative.   HENT: Negative.   Eyes: Negative.   Respiratory: Negative.   Cardiovascular: Negative.   Gastrointestinal: Negative.   Genitourinary:       Vaginal burning Vaginal itching Pelvic pain  Musculoskeletal: Negative.   Skin: Negative.   Neurological: Negative.   Endo/Heme/Allergies: Negative.   Psychiatric/Behavioral: Negative.      PHYSICAL EXAMINATION:    BP 108/68 (BP Location: Right Arm, Patient Position: Sitting, Cuff Size: Normal)   Pulse 72   Temp (!) 97 F (36.1 C) (Skin)   Wt 161 lb 3.2 oz (73.1 kg)   LMP  (LMP Unknown) Comment: Nexplanon  BMI 26.02 kg/m     General appearance: alert, cooperative and appears stated age   Pelvic: External genitalia:  no lesions              Urethra:  normal appearing urethra with no masses, tenderness or lesions              Bartholins and Skenes: normal                 Cervix: no cervical motion tenderness              Bimanual Exam:  Uterus:  normal sized, mobile, anteverted, tender, but less tender than last week.              Adnexa: no masses, bilateral tenderness.              Bladder: very tender, more tender than her uterus.   Chaperone was present for exam.  ASSESSMENT Deep dyspareunia, tender on pelvic exam. Slightly less tender on exam today after treatment with antibiotics. Still has one more week of doxycycline, negative cultures, very tender with palpation of the  bladder (no bladder c/o). Contraception, doesn't like the nexplanon. Not interested in OCP's or the  nuvaring, thinks she would like another mirena. PCOS, discussed PCOS, information given    PLAN Finish the doxycycline Try Pyridium prior to having sex to see if her bladder is part of the problem F/u for a repeat exam after finishing her antibiotics Discussed the possibility of endometriosis, we could do a trial of lupron to see if that helps her dyspareunia and intermittent dysmenorrhea. Information given Will plan to remove the nexplanon, it is hurting her, tender to palpation (no signs of infection), considering another mirena We discussed PCOS and the importance of endometrial protection, discussed the increased risk of metabolic syndrome.    An After Visit Summary was printed and given to the patient.  Over 20 minutes face to face time of which over 50% was spent in counseling.

## 2018-10-24 ENCOUNTER — Telehealth: Payer: Self-pay | Admitting: Obstetrics and Gynecology

## 2018-10-24 LAB — GC/CHLAMYDIA PROBE AMP
Chlamydia trachomatis, NAA: NEGATIVE
Neisseria Gonorrhoeae by PCR: NEGATIVE

## 2018-10-24 NOTE — Telephone Encounter (Signed)
Patient calling to check on test results.

## 2018-10-24 NOTE — Telephone Encounter (Signed)
Routing to provider to review and advise on results from 10-22-2018.   Spoke with patient. Advised patient Dr. Talbert Nan is out of the office today, but would review results upon return. Patient verbalized understanding. Patient is active on MyChart so aware Dr. Talbert Nan may send message through [place]. Patient agreeable.

## 2018-10-24 NOTE — Telephone Encounter (Signed)
Results sent to patient via MyChart and patient has reviewed results.   Viewed by Ihor Dow on 10/24/2018 10:42 AM Written by Salvadore Dom, MD on 10/24/2018 10:36 AM Hi Charlene Griffin,  Your cervical cultures are negative for infection, this doesn't mean you can't have an infection it just rules out a sexually transmitted infection.  Your testosterone level is just over the normal range. I suspect you may have some degree of polycystic ovarian syndrome, this could explain your history of irregular cycles. We can talk about this more at your visit next week.  The rest of your lab work is normal.  Please call the office with any questions.  Have a good day!  Sumner Boast  Will close encounter.

## 2018-10-26 ENCOUNTER — Encounter: Payer: Self-pay | Admitting: Obstetrics and Gynecology

## 2018-10-27 ENCOUNTER — Other Ambulatory Visit: Payer: Self-pay

## 2018-10-27 ENCOUNTER — Telehealth: Payer: Self-pay | Admitting: Obstetrics and Gynecology

## 2018-10-27 MED ORDER — FLUCONAZOLE 150 MG PO TABS: 150.0000 mg | ORAL_TABLET | Freq: Once | ORAL | 0 refills | Status: AC

## 2018-10-27 MED ORDER — BETAMETHASONE VALERATE 0.1 % EX OINT: 1.0000 "application " | TOPICAL_OINTMENT | Freq: Two times a day (BID) | CUTANEOUS | 0 refills | Status: DC

## 2018-10-27 NOTE — Telephone Encounter (Signed)
Patient notified of prescriptions. Patient verbalizes understanding and is agreeable.   Encounter closed.

## 2018-10-27 NOTE — Telephone Encounter (Signed)
Patient sent the following correspondence through [place].  Good morning with the antibiotics Im on Im pretty positive Im getting a yeast infection and was wondering if there is a medicine to fix that.

## 2018-10-27 NOTE — Telephone Encounter (Signed)
Spoke with patient. Patient seen in office on 10/14, started on Abx for PID. Reports white cottage cheese vaginal d/c itching and feeling "raw". Symptoms started on 10/15. Requesting Rx. Patient has not tried anything OTC, states OTC monistat does not work for her. Has f/u scheduled on 10/21.   Advised patient I will review with Dr. Talbert Nan and return call.   If Rx to be sent before 2pm, sent to [facility] on [address].   If after 2pm, send to [facility] in [place] on File.    Dr. Talbert Nan -please review and advise on diflucan.

## 2018-10-27 NOTE — Telephone Encounter (Signed)
Please inform script sent, I will also send in a steroid ointment for the itching.

## 2018-10-29 ENCOUNTER — Other Ambulatory Visit: Payer: Self-pay

## 2018-10-29 ENCOUNTER — Ambulatory Visit: Admitting: Obstetrics and Gynecology

## 2018-10-29 ENCOUNTER — Encounter: Payer: Self-pay | Admitting: Obstetrics and Gynecology

## 2018-10-29 VITALS — BP 108/68 | HR 72 | Temp 97.0°F | Wt 161.2 lb

## 2018-10-29 DIAGNOSIS — Z3009 Encounter for other general counseling and advice on contraception: Secondary | ICD-10-CM

## 2018-10-29 DIAGNOSIS — N946 Dysmenorrhea, unspecified: Secondary | ICD-10-CM | POA: Diagnosis not present

## 2018-10-29 DIAGNOSIS — N913 Primary oligomenorrhea: Secondary | ICD-10-CM | POA: Diagnosis not present

## 2018-10-29 DIAGNOSIS — N9412 Deep dyspareunia: Secondary | ICD-10-CM

## 2018-10-29 DIAGNOSIS — R102 Pelvic and perineal pain: Secondary | ICD-10-CM

## 2018-10-29 DIAGNOSIS — E282 Polycystic ovarian syndrome: Secondary | ICD-10-CM

## 2018-10-29 MED ORDER — PHENAZOPYRIDINE HCL 200 MG PO TABS: 200.0000 mg | ORAL_TABLET | Freq: Three times a day (TID) | ORAL | 0 refills | Status: DC | PRN

## 2018-10-29 NOTE — Patient Instructions (Signed)
Endometriosis  Endometriosis is a condition in which the tissue that lines the uterus (endometrium) grows outside of its normal location. The tissue may grow in many locations close to the uterus, but it commonly grows on the ovaries, fallopian tubes, vagina, or bowel. When the uterus sheds the endometrium every menstrual cycle, there is bleeding wherever the endometrial tissue is located. This can cause pain because blood is irritating to tissues that are not normally exposed to it. What are the causes? The cause of endometriosis is not known. What increases the risk? You may be more likely to develop endometriosis if you:  Have a family history of endometriosis.  Have never given birth.  Started your period at age 10 or younger.  Have high levels of estrogen in your body.  Were exposed to a certain medicine (diethylstilbestrol) before you were born (in utero).  Had low birth weight.  Were born as a twin, triplet, or other multiple.  Have a BMI of less than 25. BMI is an estimate of body fat and is calculated from height and weight. What are the signs or symptoms? Often, there are no symptoms of this condition. If you do have symptoms, they may:  Vary depending on where your endometrial tissue is growing.  Occur during your menstrual period (most common) or midcycle.  Come and go, or you may go months with no symptoms at all.  Stop with menopause. Symptoms may include:  Pain in the back or abdomen.  Heavier bleeding during periods.  Pain during sex.  Painful bowel movements.  Infertility.  Pelvic pain.  Bleeding more than once a month. How is this diagnosed? This condition is diagnosed based on your symptoms and a physical exam. You may have tests, such as:  Blood tests and urine tests. These may be done to help rule out other possible causes of your symptoms.  Ultrasound, to look for abnormal tissues.  An X-ray of the lower bowel (barium enema).  An  ultrasound that is done through the vagina (transvaginally).  CT scan.  MRI.  Laparoscopy. In this procedure, a lighted, pencil-sized instrument called a laparoscope is inserted into your abdomen through an incision. The laparoscope allows your health care provider to look at the organs inside your body and check for abnormal tissue to confirm the diagnosis. If abnormal tissue is found, your health care provider may remove a small piece of tissue (biopsy) to be examined under a microscope. How is this treated? Treatment for this condition may include:  Medicines to relieve pain, such as NSAIDs.  Hormone therapy. This involves using artificial (synthetic) hormones to reduce endometrial tissue growth. Your health care provider may recommend using a hormonal form of birth control, or other medicines.  Surgery. This may be done to remove abnormal endometrial tissue. ? In some cases, tissue may be removed using a laparoscope and a laser (laparoscopic laser treatment). ? In severe cases, surgery may be done to remove the fallopian tubes, uterus, and ovaries (hysterectomy). Follow these instructions at home:  Take over-the-counter and prescription medicines only as told by your health care provider.  Do not drive or use heavy machinery while taking prescription pain medicine.  Try to avoid activities that cause pain, including sexual activity.  Keep all follow-up visits as told by your health care provider. This is important. Contact a health care provider if:  You have pain in the area between your hip bones (pelvic area) that occurs: ? Before, during, or after your period. ?   In between your period and gets worse during your period. ? During or after sex. ? With bowel movements or urination, especially during your period.  You have problems getting pregnant.  You have a fever. Get help right away if:  You have severe pain that does not get better with medicine.  You have severe  nausea and vomiting, or you cannot eat without vomiting.  You have pain that affects only the lower, right side of your abdomen.  You have abdominal pain that gets worse.  You have abdominal swelling.  You have blood in your stool. This information is not intended to replace advice given to you by your health care provider. Make sure you discuss any questions you have with your health care provider. Document Released: 12/23/1999 Document Revised: 12/07/2016 Document Reviewed: 05/28/2015 Elsevier Patient Education  2020 [employer]. Polycystic Ovarian Syndrome  Polycystic ovarian syndrome (PCOS) is a common hormonal disorder among women of reproductive age. In most women with PCOS, many small fluid-filled sacs (cysts) grow on the ovaries, and the cysts are not part of a normal menstrual cycle. PCOS can cause problems with your menstrual periods and make it difficult to get pregnant. It can also cause an increased risk of miscarriage with pregnancy. If it is not treated, PCOS can lead to serious health problems, such as diabetes and heart disease. What are the causes? The cause of PCOS is not known, but it may be the result of a combination of certain factors, such as:  Irregular menstrual cycle.  High levels of certain hormones (androgens).  Problems with the hormone that helps to control blood sugar (insulin resistance).  Certain genes. What increases the risk? This condition is more likely to develop in women who have a family history of PCOS. What are the signs or symptoms? Symptoms of PCOS may include:  Multiple ovarian cysts.  Infrequent periods or no periods.  Periods that are too frequent or too heavy.  Unpredictable periods.  Inability to get pregnant (infertility) because of not ovulating.  Increased growth of hair on the face, chest, stomach, back, thumbs, thighs, or toes.  Acne or oily skin. Acne may develop during adulthood, and it may not respond to treatment.   Pelvic pain.  Weight gain or obesity.  Patches of thickened and dark brown or black skin on the neck, arms, breasts, or thighs (acanthosis nigricans).  Excess hair growth on the face, chest, abdomen, or upper thighs (hirsutism). How is this diagnosed? This condition is diagnosed based on:  Your medical history.  A physical exam, including a pelvic exam. Your health care provider may look for areas of increased hair growth on your skin.  Tests, such as: ? Ultrasound. This may be used to examine the ovaries and the lining of the uterus (endometrium) for cysts. ? Blood tests. These may be used to check levels of sugar (glucose), female hormone (testosterone), and female hormones (estrogen and progesterone) in your blood. How is this treated? There is no cure for PCOS, but treatment can help to manage symptoms and prevent more health problems from developing. Treatment varies depending on:  Your symptoms.  Whether you want to have a baby or whether you need birth control (contraception). Treatment may include nutrition and lifestyle changes along with:  Progesterone hormone to start a menstrual period.  Birth control pills to help you have regular menstrual periods.  Medicines to make you ovulate, if you want to get pregnant.  Medicine to reduce excessive hair growth.  Surgery, in severe cases. This may involve making small holes in one or both of your ovaries. This decreases the amount of testosterone that your body produces. Follow these instructions at home:  Take over-the-counter and prescription medicines only as told by your health care provider.  Follow a healthy meal plan. This can help you reduce the effects of PCOS. ? Eat a healthy diet that includes lean proteins, complex carbohydrates, fresh fruits and vegetables, low-fat dairy products, and healthy fats. Make sure to eat enough fiber.  If you are overweight, lose weight as told by your health care provider. ?  Losing 10% of your body weight may improve symptoms. ? Your health care provider can determine how much weight loss is best for you and can help you lose weight safely.  Keep all follow-up visits as told by your health care provider. This is important. Contact a health care provider if:  Your symptoms do not get better with medicine.  You develop new symptoms. This information is not intended to replace advice given to you by your health care provider. Make sure you discuss any questions you have with your health care provider. Document Released: 04/20/2004 Document Revised: 12/07/2016 Document Reviewed: 06/12/2015 Elsevier Patient Education  2020 [employer].

## 2018-11-03 ENCOUNTER — Telehealth: Payer: Self-pay | Admitting: Obstetrics and Gynecology

## 2018-11-03 NOTE — Telephone Encounter (Signed)
Patient stated that she started antibiotics on 10/22/2018, and developed a yeast infection on 10/23/2018. Patient stated that she took the first diflucan on 10/27/2018 and the second 10/30/2018. Patient stated "this is the worst and the longest yeast infection" she's ever had. She is currently experiencing discharge and itching. Patient stated that she is bleeding "almost like the yeast is eating away it."

## 2018-11-03 NOTE — Telephone Encounter (Signed)
Left message to call Galen Russman, RN at [facility] [phone].   

## 2018-11-04 ENCOUNTER — Encounter: Payer: Self-pay | Admitting: Obstetrics and Gynecology

## 2018-11-04 ENCOUNTER — Other Ambulatory Visit: Payer: Self-pay

## 2018-11-04 ENCOUNTER — Ambulatory Visit (INDEPENDENT_AMBULATORY_CARE_PROVIDER_SITE_OTHER): Admitting: Obstetrics and Gynecology

## 2018-11-04 VITALS — BP 106/66 | HR 84 | Temp 97.2°F | Wt 162.4 lb

## 2018-11-04 DIAGNOSIS — R102 Pelvic and perineal pain: Secondary | ICD-10-CM

## 2018-11-04 DIAGNOSIS — N760 Acute vaginitis: Secondary | ICD-10-CM

## 2018-11-04 MED ORDER — CLOBETASOL PROPIONATE 0.05 % EX OINT: 1.0000 "application " | TOPICAL_OINTMENT | Freq: Two times a day (BID) | CUTANEOUS | 0 refills | Status: DC

## 2018-11-04 NOTE — Addendum Note (Signed)
Addended by: Gwendlyn Deutscher on: 11/04/2018 04:24 PM   Modules accepted: Orders

## 2018-11-04 NOTE — Progress Notes (Signed)
GYNECOLOGY  VISIT   HPI: 28 y.o.   Married White or Caucasian Not Hispanic or Latino  female   G0P0000 with No LMP recorded (lmp unknown). Patient has had an implant.   here for vaginitis. She is still finishing up antibiotics, which were started for signs concerning for PID. GC/CT testing negative. She was treated for a yeast infection last week. No help with diflucan. She c/o itching, vaginal discharge, very bothersome. Valisone ointment not helping.   GYNECOLOGIC HISTORY: No LMP recorded (lmp unknown). Patient has had an implant. Contraception: Nexplanon Menopausal hormone therapy: None        OB History    Gravida  0   Para  0   Term  0   Preterm  0   AB  0   Living  0     SAB  0   TAB  0   Ectopic  0   Multiple  0   Live Births  0              Patient Active Problem List   Diagnosis Date Noted  . Migraine without aura   . Anal fissure 02/16/2016  . Rectal bleeding 02/16/2016  . Anal pain 02/16/2016    Past Medical History:  Diagnosis Date  . Anxiety   . Chronic headaches   . Depression   . Migraine without aura   . Umbilical hernia     Past Surgical History:  Procedure Laterality Date  . HERNIA REPAIR      Current Outpatient Medications  Medication Sig Dispense Refill  . amitriptyline (ELAVIL) 50 MG tablet Take 125 mg by mouth at bedtime.     . betamethasone valerate ointment (VALISONE) 0.1 % Apply 1 application topically 2 (two) times daily. Use a pea sized amount. 15 g 0  . doxycycline (VIBRAMYCIN) 100 MG capsule Take 1 capsule (100 mg total) by mouth 2 (two) times daily. Take BID for 14 days.  Take with food as can cause GI distress. 28 capsule 0  . etonogestrel (NEXPLANON) 68 MG IMPL implant 68 mg by Subdermal route once.    . Rimegepant Sulfate 75 MG TBDP Take 1 tablet by mouth as needed.    . SUMAtriptan (IMITREX) 50 MG tablet Take 1 tablet (50mg  total) by mouth may repeat times 1 in 2 hours as needed for migraines.  Max two in a 24  hour period.    . verapamil (CALAN) 40 MG tablet Take 1 tablet by mouth as needed.    . phenazopyridine (PYRIDIUM) 200 MG tablet Take 1 tablet (200 mg total) by mouth 3 (three) times daily as needed. (Patient not taking: Reported on 11/04/2018) 6 tablet 0   No current facility-administered medications for this visit.      ALLERGIES: Botox [botulinum toxin type a] and Norco [hydrocodone-acetaminophen]  Family History  Problem Relation Age of Onset  . Colon cancer Maternal Grandfather        passed away 04-25-2012    Social History   Socioeconomic History  . Marital status: Married    Spouse name: Not on file  . Number of children: Not on file  . Years of education: Not on file  . Highest education level: Not on file  Occupational History  . Not on file  Social Needs  . Financial resource strain: Not on file  . Food insecurity    Worry: Not on file    Inability: Not on file  . Transportation needs  Medical: Not on file    Non-medical: Not on file  Tobacco Use  . Smoking status: Current Every Day Smoker    Types: Cigarettes  . Smokeless tobacco: Never Used  Substance and Sexual Activity  . Alcohol use: Yes    Comment: socially  . Drug use: Never  . Sexual activity: Yes    Birth control/protection: Implant  Lifestyle  . Physical activity    Days per week: Not on file    Minutes per session: Not on file  . Stress: Not on file  Relationships  . Social Musician on phone: Not on file    Gets together: Not on file    Attends religious service: Not on file    Active member of club or organization: Not on file    Attends meetings of clubs or organizations: Not on file    Relationship status: Not on file  . Intimate partner violence    Fear of current or ex partner: Not on file    Emotionally abused: Not on file    Physically abused: Not on file    Forced sexual activity: Not on file  Other Topics Concern  . Not on file  Social History Narrative  . Not on  file    Review of Systems  Constitutional: Negative.   HENT: Negative.   Eyes: Negative.   Respiratory: Negative.   Cardiovascular: Negative.   Gastrointestinal: Negative.   Genitourinary:       Vaginal irritation Vaginal discharge Vaginal itching  Musculoskeletal: Negative.   Skin: Negative.   Neurological: Negative.   Endo/Heme/Allergies: Negative.   Psychiatric/Behavioral: Negative.     PHYSICAL EXAMINATION:    BP 106/66 (BP Location: Right Arm, Patient Position: Sitting, Cuff Size: Normal)   Pulse 84   Temp (!) 97.2 F (36.2 C) (Skin)   Wt 162 lb 6.4 oz (73.7 kg)   LMP  (LMP Unknown) Comment: Nexplanon  BMI 26.21 kg/m     General appearance: alert, cooperative and appears stated age  Pelvic: External genitalia:  no lesions, mild erythema, mild agglutination of labia minora to majora.              Urethra:  normal appearing urethra with no masses, tenderness or lesions              Bartholins and Skenes: normal                 Vagina: normal appearing vagina with a slight increase in milky, white, vaginal d/c              Cervix: no lesions and mild cervical motion tenderness              Bimanual Exam:  Uterus:  anteverted, mobile, normal size, equivocally tender              Adnexa: no masses, bilaterally tender             Bladder: tender to palpation  Chaperone was present for exam.  Wet prep: no clue, no trich, + wbc KOH: no yeast PH: 5   ASSESSMENT Vulvovaginitis, developed while on antibiotics, no help with diflucan or valisone. Vaginal slides not clear, exam not c/w yeast Tender on pelvic exam, this persists with almost 2 weeks of antibiotics. Bladder seems the most tender Long term deep dyspareunia    PLAN Affirm sent If affirm negative will send nuswab (holding it for now) Change to clobetasol ointment Can use Vaseline  as needed She will complete her antibiotics She will try azo prior to intercourse to see if that helps her dyspareunia. We  have discussed lupron as an option   An After Visit Summary was printed and given to the patient.

## 2018-11-04 NOTE — Progress Notes (Signed)
GYNECOLOGY  VISIT   HPI: 28 y.o.   Married White or Caucasian Not Hispanic or Latino  female   G0P0000 with No LMP recorded (lmp unknown). Patient has had an implant.   here for  Pain with sex . Patient states that she feels like it is worse since the yeast infection.  She was treated with a 2 week course of antibiotics for suspected PID after having an exam with significant pelvic tenderness on 10/22/18. Her cervical cultures were negative.  She was then treated over the phone for c/o a yeast infection. She returned last week with continued vaginitis symptoms, nuswab for vaginitis testing was negative.  Since then she has had intercourse and it hurt more than it did previously.  No pain with entry, her entire vagina hurts, burning, stabbing sensation. Any friction hurts, deep pain as well. Using a sensitive lubricant. Unable to have sex, it hurts too much.  Tried Azo to see if her pain with intercourse improved, no help. This was done secondary to bladder tenderness on exam.  She has a 2 cm left ovarian cyst, c/w a dermoid.   GYNECOLOGIC HISTORY: No LMP recorded (lmp unknown). Patient has had an implant. Contraception:Implant  Menopausal hormone therapy: none        OB History    Gravida  0   Para  0   Term  0   Preterm  0   AB  0   Living  0     SAB  0   TAB  0   Ectopic  0   Multiple  0   Live Births  0              Patient Active Problem List   Diagnosis Date Noted  . Migraine without aura   . Anal fissure 02/16/2016  . Rectal bleeding 02/16/2016  . Anal pain 02/16/2016    Past Medical History:  Diagnosis Date  . Anxiety   . Chronic headaches   . Depression   . Migraine without aura   . Umbilical hernia     Past Surgical History:  Procedure Laterality Date  . HERNIA REPAIR      Current Outpatient Medications  Medication Sig Dispense Refill  . amitriptyline (ELAVIL) 50 MG tablet Take 125 mg by mouth at bedtime.     . betamethasone valerate  ointment (VALISONE) 0.1 % Apply 1 application topically 2 (two) times daily. Use a pea sized amount. 15 g 0  . clobetasol ointment (TEMOVATE) 4.09 % Apply 1 application topically 2 (two) times daily. Apply as directed twice daily 15 g 0  . doxycycline (VIBRAMYCIN) 100 MG capsule Take 1 capsule (100 mg total) by mouth 2 (two) times daily. Take BID for 14 days.  Take with food as can cause GI distress. 28 capsule 0  . etonogestrel (NEXPLANON) 68 MG IMPL implant 68 mg by Subdermal route once.    . phenazopyridine (PYRIDIUM) 200 MG tablet Take 1 tablet (200 mg total) by mouth 3 (three) times daily as needed. (Patient not taking: Reported on 11/04/2018) 6 tablet 0  . Rimegepant Sulfate 75 MG TBDP Take 1 tablet by mouth as needed.    . SUMAtriptan (IMITREX) 50 MG tablet Take 1 tablet (50mg  total) by mouth may repeat times 1 in 2 hours as needed for migraines.  Max two in a 24 hour period.    . verapamil (CALAN) 40 MG tablet Take 1 tablet by mouth as needed.     No  current facility-administered medications for this visit.      ALLERGIES: Botox [botulinum toxin type a] and Norco [hydrocodone-acetaminophen]  Family History  Problem Relation Age of Onset  . Colon cancer Maternal Grandfather        passed away 2014    Social History   Socioeconomic History  . Marital status: Married    Spouse name: Not on file  . Number of children: Not on file  . Years of education: Not on file  . Highest education level: Not on file  Occupational History  . Not on file  Social Needs  . Financial resource strain: Not on file  . Food insecurity    Worry: Not on file    Inability: Not on file  . Transportation needs    Medical: Not on file    Non-medical: Not on file  Tobacco Use  . Smoking status: Current Every Day Smoker    Types: Cigarettes  . Smokeless tobacco: Never Used  Substance and Sexual Activity  . Alcohol use: Yes    Comment: socially  . Drug use: Never  . Sexual activity: Yes    Birth  control/protection: Implant  Lifestyle  . Physical activity    Days per week: Not on file    Minutes per session: Not on file  . Stress: Not on file  Relationships  . Social Musicianconnections    Talks on phone: Not on file    Gets together: Not on file    Attends religious service: Not on file    Active member of club or organization: Not on file    Attends meetings of clubs or organizations: Not on file    Relationship status: Not on file  . Intimate partner violence    Fear of current or ex partner: Not on file    Emotionally abused: Not on file    Physically abused: Not on file    Forced sexual activity: Not on file  Other Topics Concern  . Not on file  Social History Narrative  . Not on file    Review of Systems  All other systems reviewed and are negative.   PHYSICAL EXAMINATION:    BP 118/60   Pulse 78   Temp 98.2 F (36.8 C)   Ht 5\' 6"  (1.676 m)   Wt 164 lb 6.4 oz (74.6 kg)   LMP  (LMP Unknown) Comment: Nexplanon  SpO2 98%   BMI 26.53 kg/m     General appearance: alert, cooperative and appears stated age  Pelvic: External genitalia:  no lesions, not tender at the vestibule.              Urethra:  normal appearing urethra with no masses, tenderness or lesions              Bartholins and Skenes: normal                 Vagina: normal appearing vagina with normal color and discharge, no lesions. Tender with vaginal exam.               Cervix: no cervical motion tenderness and no lesions              Bimanual Exam:  Uterus:  normal size, contour, position, consistency, mobility, non-tender              Adnexa: no masses, bilaterally tender.    Pelvic floor very tender, L>R  Chaperone was present for exam.  Wet prep: ? clue, no trich, + wbc KOH: no yeast PH: 5   ASSESSMENT Worsening dyspareunia Now with pelvic floor pain Vagina tender, pelvic floor tender and adnexa tender bilaterally. Uterine and cervical tenderness have improved.      PLAN Send affirm Referral for pelvic floor PT Declines trial of lupron Discussed the option of diagnostic laparoscopy with removal of dermoid. She is interested in this She will f/u here after she is seen at PT Avoid intercourse for the next few weeks.   An After Visit Summary was printed and given to the patient.  ~20 minutes face to face time of which over 50% was spent in counseling.

## 2018-11-04 NOTE — Telephone Encounter (Signed)
Spoke with patient. Patient states she has 2 days of of doxycycline left, has completed 2 doses of diflucan and is using valisone ointment externally. Patient reports vaginal discharge, itching and bleeding. Yeast not resolving, symptoms have worsened. Advised patient OV needed for further evaluation. Covid 19 prescreen completed, precautions reviewed.  OV scheduled for today at 11:30am with Dr. Talbert Nan.   Routing to provider for final review. Patient is agreeable to disposition. Will close encounter.

## 2018-11-05 ENCOUNTER — Other Ambulatory Visit: Payer: Self-pay | Admitting: Obstetrics and Gynecology

## 2018-11-05 DIAGNOSIS — N760 Acute vaginitis: Secondary | ICD-10-CM

## 2018-11-05 LAB — VAGINITIS/VAGINOSIS, DNA PROBE
Candida Species: NEGATIVE
Gardnerella vaginalis: NEGATIVE
Trichomonas vaginosis: NEGATIVE

## 2018-11-07 ENCOUNTER — Other Ambulatory Visit: Payer: Self-pay

## 2018-11-07 LAB — NUSWAB BV AND CANDIDA, NAA
Candida albicans, NAA: NEGATIVE
Candida glabrata, NAA: NEGATIVE

## 2018-11-10 ENCOUNTER — Encounter: Payer: Self-pay | Admitting: Obstetrics and Gynecology

## 2018-11-10 ENCOUNTER — Other Ambulatory Visit: Payer: Self-pay

## 2018-11-10 ENCOUNTER — Telehealth: Payer: Self-pay | Admitting: Obstetrics and Gynecology

## 2018-11-10 ENCOUNTER — Ambulatory Visit (INDEPENDENT_AMBULATORY_CARE_PROVIDER_SITE_OTHER): Admitting: Obstetrics and Gynecology

## 2018-11-10 VITALS — BP 118/60 | HR 78 | Temp 98.2°F | Ht 66.0 in | Wt 164.4 lb

## 2018-11-10 DIAGNOSIS — M6289 Other specified disorders of muscle: Secondary | ICD-10-CM

## 2018-11-10 DIAGNOSIS — N949 Unspecified condition associated with female genital organs and menstrual cycle: Secondary | ICD-10-CM

## 2018-11-10 DIAGNOSIS — N941 Unspecified dyspareunia: Secondary | ICD-10-CM

## 2018-11-10 NOTE — Telephone Encounter (Signed)
Patient is returning a call to triage. 

## 2018-11-10 NOTE — Telephone Encounter (Signed)
Patient is calling regarding physical therapy referral. Patient stated that she needs to speak with someone prior to referral being sent.

## 2018-11-10 NOTE — Telephone Encounter (Signed)
Left message to call Nicko Daher, RN at [facility] [phone].   

## 2018-11-11 ENCOUNTER — Telehealth: Payer: Self-pay | Admitting: Obstetrics and Gynecology

## 2018-11-11 LAB — VAGINITIS/VAGINOSIS, DNA PROBE
Candida Species: NEGATIVE
Gardnerella vaginalis: NEGATIVE
Trichomonas vaginosis: NEGATIVE

## 2018-11-11 NOTE — Telephone Encounter (Signed)
I spoke with Charlene Griffin at [facility] care and he informed me of the process for the referral. Patient will need the primary doctor to place a new referral to specialist office since the office is out of network.   I spoke with Darly at Dr. Boggianno(resident)/ Dr. Chinita Pester office. Dr. Lehman Prom is the patients PCP and will do a new referral for PT. She will get this information along with office notes from [facility] to the provider to do a new referral to [facility]. Office notes have been faxed thru Epic to the PCP office.

## 2018-11-11 NOTE — Telephone Encounter (Signed)
Spoke with patient. Patient has additional questions about referral to [facility] specific to her insurance. Advised I have sent a message to referral coordinator for return call. Patient agreeable.   Encounter closed.

## 2018-11-11 NOTE — Telephone Encounter (Signed)
Referral completed and patient aware.  Encounter closed.

## 2018-11-11 NOTE — Telephone Encounter (Signed)
Charlene Griffin,  I spoke with the patient and she gave me the instructions for the referral.  1. The referral must state why the provider is requesting to use this provider [facility]. 2. The referral must state that this is the only provider in the area that can perform the service she needs. 3. The referral must then be sent to Dr. Meryl Crutch. She is not her primary doctor but she is the doctor that started the process. Dr. Meryl Crutch must sent the referral to [facility] and [employer] in order for this to be a covered service.   [employer]

## 2018-11-14 ENCOUNTER — Telehealth: Payer: Self-pay | Admitting: Obstetrics and Gynecology

## 2018-11-14 NOTE — Telephone Encounter (Signed)
Patient would like to speak with nurse about scheduling surgery if therapy does not work.

## 2018-11-17 NOTE — Telephone Encounter (Signed)
Gay Filler: please schedule her for a diagnostic laparoscopy, possible treatment of endometriosis and left ovarian cystectomy (dermoid).

## 2018-11-17 NOTE — Telephone Encounter (Signed)
Spoke with patient. Patient is requesting to proceed with surgery discussed during 11/10/18 OV. States pelvic PT is not covered by her insurance, no change in symptoms. Patient is specifically want to schedule week of Christmas.   Advised patient I will have to forward to Dr. Talbert Nan to review, will also forward to [facility] for return call to discuss scheduling options. Patient verbalizes understanding.   Routing to Dr. Talbert Nan  Cc: Lamont Snowball, RN. Suzy dixon

## 2018-11-18 NOTE — Telephone Encounter (Signed)
Call to patient. Review of available dates during requested week of Christmas holiday. Patient is agreeable to 12-29-18. Advised provider will need to review. Will call back.

## 2018-11-25 NOTE — Telephone Encounter (Signed)
I have spoken with patient in great detail regarding benefits for surgery and the prior approval process. Patient acknowledges understanding of information presented. Patient has confirmed and is ready to proceed with scheduling on 12/29/2018. Patient is aware this is the professional benefits only. Patient aware once surgery has been scheduled, the hospital will call her with separate benefits. Forwarding to Lamont Snowball, RN for scheduling.

## 2018-12-01 NOTE — Telephone Encounter (Signed)
Call to patient. Surgery information reviewed in detail with patient and she verbalized understanding. Patient states she can only do 0800 or 0830 appointment for her pre op. RN advised would need to review schedule with Dr. Talbert Nan for slot. Patient agreeable. Patient advised would be contacted by [facility] to schedule separate pre op at their facility. Patient agreeable. Patient advised of Covid test appointment and asking if she can be moved to late Friday to accommodate work schedule? RN advised patient would need to quarantine after testing until surgery and if test done late Friday, results may not be available by surgery on Monday, but RN would review with nursing supervisor. Post op appointment scheduled for 01-12-19 at 0800. Patient agreeable to date and time of appointment.   Routing to provider to review and advise on pre op appointment.   Cc Lamont Snowball, RN

## 2018-12-01 NOTE — Telephone Encounter (Signed)
Call to patient. Advised of Covid testing requirements as dictated by hospital.  Surgery consult appointment scheduled for 0800 on 12-09-18. Patient advised that due to Covid 19, it is possible that elective surgery may be adjusted or canceled depending on Covid volumes. Verbalized understanding.   Routing to Dr Talbert Nan. Encounter closed.

## 2018-12-03 ENCOUNTER — Telehealth: Payer: Self-pay | Admitting: Obstetrics and Gynecology

## 2018-12-03 MED ORDER — LIDOCAINE 5 % EX OINT: TOPICAL_OINTMENT | CUTANEOUS | 0 refills | Status: DC

## 2018-12-03 NOTE — Telephone Encounter (Signed)
I'm more comfortable with an ointment or gel in the vaginal area. Please let her know that I will send in a script for lidocaine ointment for her and explain the goodrx site to her so she can get a discount.

## 2018-12-03 NOTE — Telephone Encounter (Signed)
Spoke with pt. Pt states Rx not at [facility] for Lidocaine.   Placed call to [facility] on file. Pharmacy tech claims no Rx and having trouble with e-scripts. Given VO for Rx as directed by Dr Talbert Nan.   Called pt back to let know of sent Rx. Pharmacy will call when ready.   Will route to Dr Talbert Nan for review and will close encounter.

## 2018-12-03 NOTE — Telephone Encounter (Signed)
Call placed to patient, left detailed message, ok per dpr. Advised per Dr. Talbert Nan. Rx has been sent to [facility] as discussed. Return call to office if any additional questions.   Encounter closed.

## 2018-12-03 NOTE — Telephone Encounter (Signed)
Patient is calling regarding appointment with physical therapist. Patient stated that the physical therapist has a recommendation for the medication she would like [facility] to try, but is unable to prescribe it. Patient is calling to see if Dr. Talbert Nan would be able to prescribe medication.

## 2018-12-03 NOTE — Telephone Encounter (Signed)
Spoke with patient. Patient was referred for pelvic pt. Patient states therapist recommended Rx for 2 -5% lidocaine cream, apply to vaginal opening prior to intercourse, PT unable to prescribe. Patient is requesting Rx to pharmacy on file. Advised I will review with provider and return call with recommendations once reviewed.   Dr. Talbert Nan -please advise on Rx.

## 2018-12-03 NOTE — Telephone Encounter (Signed)
Patient calling for Sharee Pimple with questions about prescription for lidocaine ointment. States as of 1:51 pm, pharmacy did not have prescription.

## 2018-12-08 ENCOUNTER — Other Ambulatory Visit: Payer: Self-pay

## 2018-12-09 ENCOUNTER — Ambulatory Visit (INDEPENDENT_AMBULATORY_CARE_PROVIDER_SITE_OTHER): Admitting: Obstetrics and Gynecology

## 2018-12-09 ENCOUNTER — Encounter: Payer: Self-pay | Admitting: Obstetrics and Gynecology

## 2018-12-09 VITALS — BP 108/70 | HR 76 | Temp 97.2°F | Wt 162.8 lb

## 2018-12-09 DIAGNOSIS — N941 Unspecified dyspareunia: Secondary | ICD-10-CM

## 2018-12-09 DIAGNOSIS — N83202 Unspecified ovarian cyst, left side: Secondary | ICD-10-CM

## 2018-12-09 DIAGNOSIS — N946 Dysmenorrhea, unspecified: Secondary | ICD-10-CM

## 2018-12-09 NOTE — H&P (Signed)
[facility]  [facility]2018 [facility]   Oscar LaJertson, Craig GuessJill Evelyn, MD Obstetrics and Gynecology  Left ovarian cyst +2 more Dx  Advice Only   ; Referred by Nestor RampMounsey, Anne L, MD Reason for Visit  Additional Documentation  Vitals:   BP 108/70 (BP Location: Right Arm, Patient Position: Sitting, Cuff Size: Normal)  Pulse 76  Temp 97.2 F (36.2 C) (Skin)  Wt 162 lb 12.8 oz (73.8 kg)  LMP (LMP Unknown)  BMI 26.28 kg/m  BSA 1.85 m  Flowsheets:   NEWS,  MEWS Score,  Anthropometrics,  Method of Visit    Encounter Info:   Billing Info,  History,  Allergies,  Detailed Report    Orthostatic Vitals Recorded in This Encounter   12/09/2018  0754     Patient Position: Sitting  BP Location: Right Arm  Cuff Size: Normal  All Notes  Progress Notes by Romualdo BolkJertson, Glynda Soliday Evelyn, MD at 12/09/2018 8:00 AM Author: Romualdo BolkJertson, Dontavis Tschantz Evelyn, MD Author Type: Physician Filed: 12/09/2018 9:17 AM  Note Status: Signed Cosign: Cosign Not Required Encounter Date: 12/09/2018  Editor: Romualdo BolkJertson, Mariely Mahr Evelyn, MD (Physician)  Prior Versions: 1. Sprague, Caroleen HammanKaitlyn E, RN (Registered Nurse) at 12/09/2018 8:07 AM - Sign when Signing Visit    GYNECOLOGY  VISIT   HPI: 28 y.o.   Married White or Caucasian Not Hispanic or Latino  female   G0P0000 with No LMP recorded (lmp unknown). Patient has had an implant.   here for pre op.  Patient is due to give herself emgality on 12/19 and would like to know if she needs to give herself this injection this month prior to surgery. Patient is asking for anti nausea medication if she is going to be given pain medication. Patient does not want details regarding surgery due to anxiety. The patient has a 2 cm left ovarian cyst, c/w a dermoid. She has worsening dyspareunia and has been referred to PT for pelvic floor and vaginal pain. She has started PT and feels it is helping some. She is more aware of her body, sex is getting better.  Not having cycles with the  Nexplanon, but gets occasional cramping and lower back pain. No baseline pain.  H/O severe cramps prior to contraception.   GYNECOLOGIC HISTORY: No LMP recorded (lmp unknown). Patient has had an implant. Contraception: Nexplanon Menopausal hormone therapy: None                OB History    Gravida  0   Para  0   Term  0   Preterm  0   AB  0   Living  0     SAB  0   TAB  0   Ectopic  0   Multiple  0   Live Births  0                  Patient Active Problem List   Diagnosis Date Noted  . Migraine without aura   . Anal fissure 02/16/2016  . Rectal bleeding 02/16/2016  . Anal pain 02/16/2016        Past Medical History:  Diagnosis Date  . Anxiety   . Chronic headaches   . Depression   . Migraine without aura   . Umbilical hernia          Past Surgical History:  Procedure Laterality Date  . HERNIA REPAIR            Current Outpatient Medications  Medication Sig  Dispense Refill  . etonogestrel (NEXPLANON) 68 MG IMPL implant 68 mg by Subdermal route once.    . Galcanezumab-gnlm (EMGALITY) 120 MG/ML SOAJ Inject into the skin every 30 (thirty) days.    Marland Kitchen lidocaine (XYLOCAINE) 5 % ointment Apply topically 20 minutes prior to intercourse, wipe off just prior to intercourse. 30 g 0  . Rimegepant Sulfate 75 MG TBDP Take 1 tablet by mouth as needed.    . SUMAtriptan (IMITREX) 50 MG tablet Take 1 tablet (50mg  total) by mouth may repeat times 1 in 2 hours as needed for migraines.  Max two in a 24 hour period.    . verapamil (CALAN) 40 MG tablet Take 1 tablet by mouth as needed.     No current facility-administered medications for this visit.      ALLERGIES: Botox [botulinum toxin type a] and Norco [hydrocodone-acetaminophen]  She has a sensitive stomach and has nausea with narcotics.        Family History  Problem Relation Age of Onset  . Colon cancer Maternal Grandfather        passed away 05-05-12    Social  History        Socioeconomic History  . Marital status: Married    Spouse name: Not on file  . Number of children: Not on file  . Years of education: Not on file  . Highest education level: Not on file  Occupational History  . Not on file  Social Needs  . Financial resource strain: Not on file  . Food insecurity    Worry: Not on file    Inability: Not on file  . Transportation needs    Medical: Not on file    Non-medical: Not on file  Tobacco Use  . Smoking status: Current Every Day Smoker    Types: Cigarettes  . Smokeless tobacco: Never Used  Substance and Sexual Activity  . Alcohol use: Yes    Comment: socially  . Drug use: Never  . Sexual activity: Yes    Birth control/protection: Implant  Lifestyle  . Physical activity    Days per week: Not on file    Minutes per session: Not on file  . Stress: Not on file  Relationships  . Social Herbalist on phone: Not on file    Gets together: Not on file    Attends religious service: Not on file    Active member of club or organization: Not on file    Attends meetings of clubs or organizations: Not on file    Relationship status: Not on file  . Intimate partner violence    Fear of current or ex partner: Not on file    Emotionally abused: Not on file    Physically abused: Not on file    Forced sexual activity: Not on file  Other Topics Concern  . Not on file  Social History Narrative  . Not on file    Review of Systems  Constitutional: Negative.   HENT: Negative.   Eyes: Negative.   Respiratory: Negative.   Cardiovascular: Negative.   Gastrointestinal: Negative.   Genitourinary: Negative.   Musculoskeletal: Negative.   Skin: Negative.   Neurological: Negative.   Endo/Heme/Allergies: Negative.   Psychiatric/Behavioral: Negative.     PHYSICAL EXAMINATION:    BP 108/70 (BP Location: Right Arm, Patient Position: Sitting, Cuff Size: Normal)   Pulse 76    Temp (!) 97.2 F (36.2 C) (Skin)   Wt 162 lb 12.8 oz (73.8  kg)   LMP  (LMP Unknown)   BMI 26.28 kg/m     General appearance: alert, cooperative and appears stated age Neck: no adenopathy, supple, symmetrical, trachea midline and thyroid normal to inspection and palpation Heart: regular rate and rhythm Lungs: CTAB Abdomen: soft, non-tender; bowel sounds normal; no masses,  no organomegaly Extremities: normal, atraumatic, no cyanosis Skin: normal color, texture and turgor, no rashes or lesions Lymph: normal cervical supraclavicular and inguinal nodes Neurologic: grossly normal     ASSESSMENT Left ovarian cyst, 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 Discussed risks of surgery, including: bleeding, infection, formation of scar tissue, damage to bowel, bladder, vessels, ureters. Possible need for further surgery I reviewed the ultrasound images with the patient (on her phone), the dermoid appears to be in the center of her ovary which could increase the risk of injury to that ovary.    An After Visit Summary was printed and given to the patient.  ~30 minutes face to face time of which over 50% was spent in counseling.       Addendum: we did discuss left upper quadrant entry given her prior umbilical hernia repair.

## 2018-12-09 NOTE — Progress Notes (Signed)
GYNECOLOGY  VISIT   HPI: 28 y.o.   Married White or Caucasian Not Hispanic or Latino  female   G0P0000 with No LMP recorded (lmp unknown). Patient has had an implant.   here for pre op.  Patient is due to give herself emgality on 12/19 and would like to know if she needs to give herself this injection this month prior to surgery. Patient is asking for anti nausea medication if she is going to be given pain medication. Patient does not want details regarding surgery due to anxiety. The patient has a 2 cm left ovarian cyst, c/w a dermoid. She has worsening dyspareunia and has been referred to PT for pelvic floor and vaginal pain. She has started PT and feels it is helping some. She is more aware of her body, sex is getting better.  Not having cycles with the Nexplanon, but gets occasional cramping and lower back pain. No baseline pain.  H/O severe cramps prior to contraception.   GYNECOLOGIC HISTORY: No LMP recorded (lmp unknown). Patient has had an implant. Contraception: Nexplanon Menopausal hormone therapy: None        OB History    Gravida  0   Para  0   Term  0   Preterm  0   AB  0   Living  0     SAB  0   TAB  0   Ectopic  0   Multiple  0   Live Births  0              Patient Active Problem List   Diagnosis Date Noted  . Migraine without aura   . Anal fissure 02/16/2016  . Rectal bleeding 02/16/2016  . Anal pain 02/16/2016    Past Medical History:  Diagnosis Date  . Anxiety   . Chronic headaches   . Depression   . Migraine without aura   . Umbilical hernia     Past Surgical History:  Procedure Laterality Date  . HERNIA REPAIR      Current Outpatient Medications  Medication Sig Dispense Refill  . etonogestrel (NEXPLANON) 68 MG IMPL implant 68 mg by Subdermal route once.    . Galcanezumab-gnlm (EMGALITY) 120 MG/ML SOAJ Inject into the skin every 30 (thirty) days.    Marland Kitchen lidocaine (XYLOCAINE) 5 % ointment Apply topically 20 minutes prior to  intercourse, wipe off just prior to intercourse. 30 g 0  . Rimegepant Sulfate 75 MG TBDP Take 1 tablet by mouth as needed.    . SUMAtriptan (IMITREX) 50 MG tablet Take 1 tablet (50mg  total) by mouth may repeat times 1 in 2 hours as needed for migraines.  Max two in a 24 hour period.    . verapamil (CALAN) 40 MG tablet Take 1 tablet by mouth as needed.     No current facility-administered medications for this visit.      ALLERGIES: Botox [botulinum toxin type a] and Norco [hydrocodone-acetaminophen]  She has a sensitive stomach and has nausea with narcotics.   Family History  Problem Relation Age of Onset  . Colon cancer Maternal Grandfather        passed away 11-May-2012    Social History   Socioeconomic History  . Marital status: Married    Spouse name: Not on file  . Number of children: Not on file  . Years of education: Not on file  . Highest education level: Not on file  Occupational History  . Not on file  Social Needs  .  Financial resource strain: Not on file  . Food insecurity    Worry: Not on file    Inability: Not on file  . Transportation needs    Medical: Not on file    Non-medical: Not on file  Tobacco Use  . Smoking status: Current Every Day Smoker    Types: Cigarettes  . Smokeless tobacco: Never Used  Substance and Sexual Activity  . Alcohol use: Yes    Comment: socially  . Drug use: Never  . Sexual activity: Yes    Birth control/protection: Implant  Lifestyle  . Physical activity    Days per week: Not on file    Minutes per session: Not on file  . Stress: Not on file  Relationships  . Social Musician on phone: Not on file    Gets together: Not on file    Attends religious service: Not on file    Active member of club or organization: Not on file    Attends meetings of clubs or organizations: Not on file    Relationship status: Not on file  . Intimate partner violence    Fear of current or ex partner: Not on file    Emotionally abused:  Not on file    Physically abused: Not on file    Forced sexual activity: Not on file  Other Topics Concern  . Not on file  Social History Narrative  . Not on file    Review of Systems  Constitutional: Negative.   HENT: Negative.   Eyes: Negative.   Respiratory: Negative.   Cardiovascular: Negative.   Gastrointestinal: Negative.   Genitourinary: Negative.   Musculoskeletal: Negative.   Skin: Negative.   Neurological: Negative.   Endo/Heme/Allergies: Negative.   Psychiatric/Behavioral: Negative.     PHYSICAL EXAMINATION:    BP 108/70 (BP Location: Right Arm, Patient Position: Sitting, Cuff Size: Normal)   Pulse 76   Temp (!) 97.2 F (36.2 C) (Skin)   Wt 162 lb 12.8 oz (73.8 kg)   LMP  (LMP Unknown)   BMI 26.28 kg/m     General appearance: alert, cooperative and appears stated age Neck: no adenopathy, supple, symmetrical, trachea midline and thyroid normal to inspection and palpation Heart: regular rate and rhythm Lungs: CTAB Abdomen: soft, non-tender; bowel sounds normal; no masses,  no organomegaly Extremities: normal, atraumatic, no cyanosis Skin: normal color, texture and turgor, no rashes or lesions Lymph: normal cervical supraclavicular and inguinal nodes Neurologic: grossly normal     ASSESSMENT Left ovarian cyst, 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 Discussed risks of surgery, including: bleeding, infection, formation of scar tissue, damage to bowel, bladder, vessels, ureters. Possible need for further surgery I reviewed the ultrasound images with the patient (on her phone), the dermoid appears to be in the center of her ovary which could increase the risk of injury to that ovary.    An After Visit Summary was printed and given to the patient.  ~30 minutes face to face time of  which over 50% was spent in counseling.

## 2018-12-25 ENCOUNTER — Other Ambulatory Visit: Payer: Self-pay

## 2018-12-25 ENCOUNTER — Encounter (HOSPITAL_BASED_OUTPATIENT_CLINIC_OR_DEPARTMENT_OTHER): Payer: Self-pay | Admitting: Obstetrics and Gynecology

## 2018-12-25 ENCOUNTER — Other Ambulatory Visit (HOSPITAL_COMMUNITY)
Admission: RE | Admit: 2018-12-25 | Discharge: 2018-12-25 | Disposition: A | Discharge status: Home / Self Care | Source: Ambulatory Visit | Attending: Obstetrics and Gynecology | Admitting: Obstetrics and Gynecology

## 2018-12-25 DIAGNOSIS — Z20828 Contact with and (suspected) exposure to other viral communicable diseases: Secondary | ICD-10-CM | POA: Diagnosis not present

## 2018-12-25 DIAGNOSIS — Z01812 Encounter for preprocedural laboratory examination: Secondary | ICD-10-CM | POA: Insufficient documentation

## 2018-12-25 NOTE — Progress Notes (Signed)
Spoke w/ via phone for pre-op interview---Charlene Griffin needs dos---- urine preg             Griffin results------ COVID test ------12-25-18 Arrive at -------845 am 12-121-20 NPO after ------midnight Medications to take morning of surgery -----none Diabetic medication -----n/a Patient Special Instructions ----- Pre-Op special Istructions ----- Patient verbalized understanding of instructions that were given at this phone interview. Patient denies shortness of breath, chest pain, fever, cough a this phone interview.

## 2018-12-26 ENCOUNTER — Telehealth: Payer: Self-pay | Admitting: Obstetrics and Gynecology

## 2018-12-26 LAB — NOVEL CORONAVIRUS, NAA (HOSP ORDER, SEND-OUT TO REF LAB; TAT 18-24 HRS): SARS-CoV-2, NAA: NOT DETECTED

## 2018-12-26 NOTE — Telephone Encounter (Signed)
Spoke with patient and conveyed the surgery benefits. Patient understands the benefits. Patient called her insurance and was told she should only have one co pay for surgery. Patient is aware once all claims are processed through the [facility] system and there is a credit she will be refunded.

## 2018-12-26 NOTE — Telephone Encounter (Signed)
Call placed to convey surgery benefits. °

## 2018-12-28 NOTE — Anesthesia Preprocedure Evaluation (Addendum)
Anesthesia Evaluation  Patient identified by MRN, date of birth, ID band Patient awake    Reviewed: Allergy & Precautions, NPO status , Patient's Chart, lab work & pertinent test results  Airway Mallampati: II  TM Distance: >3 FB Neck ROM: Full    Dental no notable dental hx. (+) Teeth Intact, Dental Advisory Given   Pulmonary neg pulmonary ROS, Current Smoker and Patient abstained from smoking.,    Pulmonary exam normal breath sounds clear to auscultation       Cardiovascular Exercise Tolerance: Good negative cardio ROS Normal cardiovascular exam Rhythm:Regular Rate:Normal     Neuro/Psych  Headaches, Anxiety    GI/Hepatic Neg liver ROS, GERD  ,  Endo/Other  negative endocrine ROS  Renal/GU      Musculoskeletal negative musculoskeletal ROS (+)   Abdominal   Peds  Hematology negative hematology ROS (+)   Anesthesia Other Findings   Reproductive/Obstetrics                            Anesthesia Physical Anesthesia Plan  ASA: II  Anesthesia Plan: General   Post-op Pain Management:    Induction:   PONV Risk Score and Plan: 3 and Treatment may vary due to age or medical condition, Ondansetron, Dexamethasone and Midazolam  Airway Management Planned: Oral ETT  Additional Equipment: None  Intra-op Plan:   Post-operative Plan: Extubation in OR  Informed Consent: I have reviewed the patients History and Physical, chart, labs and discussed the procedure including the risks, benefits and alternatives for the proposed anesthesia with the patient or authorized representative who has indicated his/her understanding and acceptance.     Dental advisory given  Plan Discussed with:   Anesthesia Plan Comments: (GA )       Anesthesia Quick Evaluation

## 2018-12-29 ENCOUNTER — Encounter (HOSPITAL_BASED_OUTPATIENT_CLINIC_OR_DEPARTMENT_OTHER): Payer: Self-pay | Admitting: Obstetrics and Gynecology

## 2018-12-29 ENCOUNTER — Ambulatory Visit (HOSPITAL_BASED_OUTPATIENT_CLINIC_OR_DEPARTMENT_OTHER): Admitting: Anesthesiology

## 2018-12-29 ENCOUNTER — Other Ambulatory Visit: Payer: Self-pay

## 2018-12-29 ENCOUNTER — Ambulatory Visit (HOSPITAL_BASED_OUTPATIENT_CLINIC_OR_DEPARTMENT_OTHER)
Admission: RE | Admit: 2018-12-29 | Discharge: 2018-12-29 | Disposition: A | Discharge status: Home / Self Care | Attending: Obstetrics and Gynecology | Admitting: Obstetrics and Gynecology

## 2018-12-29 ENCOUNTER — Encounter (HOSPITAL_BASED_OUTPATIENT_CLINIC_OR_DEPARTMENT_OTHER)
Admission: RE | Disposition: A | Discharge status: Home / Self Care | Payer: Self-pay | Source: Home / Self Care | Attending: Obstetrics and Gynecology

## 2018-12-29 DIAGNOSIS — F17210 Nicotine dependence, cigarettes, uncomplicated: Secondary | ICD-10-CM | POA: Insufficient documentation

## 2018-12-29 DIAGNOSIS — G43909 Migraine, unspecified, not intractable, without status migrainosus: Secondary | ICD-10-CM | POA: Diagnosis not present

## 2018-12-29 DIAGNOSIS — Z79899 Other long term (current) drug therapy: Secondary | ICD-10-CM | POA: Insufficient documentation

## 2018-12-29 DIAGNOSIS — N941 Unspecified dyspareunia: Secondary | ICD-10-CM | POA: Diagnosis not present

## 2018-12-29 DIAGNOSIS — N83202 Unspecified ovarian cyst, left side: Secondary | ICD-10-CM | POA: Diagnosis not present

## 2018-12-29 DIAGNOSIS — N736 Female pelvic peritoneal adhesions (postinfective): Secondary | ICD-10-CM

## 2018-12-29 DIAGNOSIS — K660 Peritoneal adhesions (postprocedural) (postinfection): Secondary | ICD-10-CM | POA: Insufficient documentation

## 2018-12-29 DIAGNOSIS — N946 Dysmenorrhea, unspecified: Secondary | ICD-10-CM | POA: Insufficient documentation

## 2018-12-29 HISTORY — PX: LAPAROSCOPY: SHX197

## 2018-12-29 HISTORY — PX: OVARIAN CYST REMOVAL: SHX89

## 2018-12-29 HISTORY — DX: Gastro-esophageal reflux disease without esophagitis: K21.9

## 2018-12-29 LAB — POCT PREGNANCY, URINE: Preg Test, Ur: NEGATIVE

## 2018-12-29 SURGERY — LAPAROSCOPY, DIAGNOSTIC
Anesthesia: General | Site: Abdomen

## 2018-12-29 MED ORDER — KETOROLAC TROMETHAMINE 30 MG/ML IJ SOLN
30.0000 mg | Freq: Once | INTRAMUSCULAR | Status: DC | PRN
  Filled 2018-12-29: qty 1

## 2018-12-29 MED ORDER — LACTATED RINGERS IV SOLN
INTRAVENOUS | Status: DC
  Filled 2018-12-29: qty 1000

## 2018-12-29 MED ORDER — OXYCODONE HCL 5 MG PO TABS: 5.0000 mg | ORAL_TABLET | ORAL | 0 refills | Status: DC | PRN

## 2018-12-29 MED ORDER — FENTANYL CITRATE (PF) 100 MCG/2ML IJ SOLN
INTRAMUSCULAR | Status: AC
  Filled 2018-12-29: qty 2

## 2018-12-29 MED ORDER — MIDAZOLAM HCL 2 MG/2ML IJ SOLN
INTRAMUSCULAR | Status: AC
  Filled 2018-12-29: qty 2

## 2018-12-29 MED ORDER — OXYCODONE HCL 5 MG PO TABS
5.0000 mg | ORAL_TABLET | Freq: Once | ORAL | Status: AC | PRN
  Administered 2018-12-29: 14:00:00 5 mg via ORAL
  Filled 2018-12-29: qty 1

## 2018-12-29 MED ORDER — KETOROLAC TROMETHAMINE 30 MG/ML IJ SOLN
INTRAMUSCULAR | Status: AC
  Filled 2018-12-29: qty 1

## 2018-12-29 MED ORDER — OXYCODONE HCL 5 MG/5ML PO SOLN
5.0000 mg | Freq: Once | ORAL | Status: AC | PRN
  Filled 2018-12-29: qty 5

## 2018-12-29 MED ORDER — ONDANSETRON 4 MG PO TBDP
ORAL_TABLET | ORAL | Status: AC
  Filled 2018-12-29: qty 1

## 2018-12-29 MED ORDER — ONDANSETRON HCL 4 MG/2ML IJ SOLN
INTRAMUSCULAR | Status: AC
  Filled 2018-12-29: qty 2

## 2018-12-29 MED ORDER — MEPERIDINE HCL 25 MG/ML IJ SOLN
6.2500 mg | INTRAMUSCULAR | Status: DC | PRN
  Filled 2018-12-29: qty 1

## 2018-12-29 MED ORDER — KETOROLAC TROMETHAMINE 30 MG/ML IJ SOLN
INTRAMUSCULAR | Status: DC | PRN
  Administered 2018-12-29: 30 mg via INTRAVENOUS

## 2018-12-29 MED ORDER — PROPOFOL 10 MG/ML IV BOLUS
INTRAVENOUS | Status: DC | PRN
  Administered 2018-12-29: 180 mg via INTRAVENOUS

## 2018-12-29 MED ORDER — PROPOFOL 10 MG/ML IV BOLUS
INTRAVENOUS | Status: AC
  Filled 2018-12-29: qty 20

## 2018-12-29 MED ORDER — MIDAZOLAM HCL 5 MG/5ML IJ SOLN
INTRAMUSCULAR | Status: DC | PRN
  Administered 2018-12-29: 2 mg via INTRAVENOUS

## 2018-12-29 MED ORDER — ACETAMINOPHEN 500 MG PO TABS
1000.0000 mg | ORAL_TABLET | ORAL | Status: AC
  Administered 2018-12-29: 09:00:00 1000 mg via ORAL
  Filled 2018-12-29: qty 2

## 2018-12-29 MED ORDER — ACETAMINOPHEN 500 MG PO TABS
ORAL_TABLET | ORAL | Status: AC
  Filled 2018-12-29: qty 2

## 2018-12-29 MED ORDER — FENTANYL CITRATE (PF) 100 MCG/2ML IJ SOLN
INTRAMUSCULAR | Status: DC | PRN
  Administered 2018-12-29 (×2): 50 ug via INTRAVENOUS

## 2018-12-29 MED ORDER — SUGAMMADEX SODIUM 200 MG/2ML IV SOLN
INTRAVENOUS | Status: DC | PRN
  Administered 2018-12-29: 160 mg via INTRAVENOUS

## 2018-12-29 MED ORDER — ACETAMINOPHEN 500 MG PO TABS
1000.0000 mg | ORAL_TABLET | Freq: Once | ORAL | Status: DC
  Filled 2018-12-29: qty 2

## 2018-12-29 MED ORDER — SODIUM CHLORIDE 0.9 % IR SOLN
Status: DC | PRN
  Administered 2018-12-29: 200 mL

## 2018-12-29 MED ORDER — IBUPROFEN 800 MG PO TABS: 800.0000 mg | ORAL_TABLET | Freq: Three times a day (TID) | ORAL | 0 refills | Status: DC | PRN

## 2018-12-29 MED ORDER — FENTANYL CITRATE (PF) 100 MCG/2ML IJ SOLN
25.0000 ug | INTRAMUSCULAR | Status: DC | PRN
  Administered 2018-12-29: 25 ug via INTRAVENOUS
  Administered 2018-12-29: 50 ug via INTRAVENOUS
  Administered 2018-12-29 (×2): 25 ug via INTRAVENOUS
  Filled 2018-12-29: qty 1

## 2018-12-29 MED ORDER — DEXAMETHASONE SODIUM PHOSPHATE 10 MG/ML IJ SOLN
INTRAMUSCULAR | Status: AC
  Filled 2018-12-29: qty 1

## 2018-12-29 MED ORDER — ONDANSETRON HCL 4 MG/2ML IJ SOLN
4.0000 mg | Freq: Once | INTRAMUSCULAR | Status: DC | PRN
  Filled 2018-12-29: qty 2

## 2018-12-29 MED ORDER — LIDOCAINE 2% (20 MG/ML) 5 ML SYRINGE
INTRAMUSCULAR | Status: DC | PRN
  Administered 2018-12-29: 100 mg via INTRAVENOUS

## 2018-12-29 MED ORDER — ONDANSETRON 4 MG PO TBDP
4.0000 mg | ORAL_TABLET | Freq: Once | ORAL | Status: AC
  Administered 2018-12-29: 13:00:00 4 mg via ORAL
  Filled 2018-12-29: qty 1

## 2018-12-29 MED ORDER — DEXAMETHASONE SODIUM PHOSPHATE 10 MG/ML IJ SOLN
INTRAMUSCULAR | Status: DC | PRN
  Administered 2018-12-29: 10 mg via INTRAVENOUS

## 2018-12-29 MED ORDER — WHITE PETROLATUM EX OINT
TOPICAL_OINTMENT | CUTANEOUS | Status: AC
  Filled 2018-12-29: qty 5

## 2018-12-29 MED ORDER — SCOPOLAMINE 1 MG/3DAYS TD PT72
MEDICATED_PATCH | TRANSDERMAL | Status: AC
  Filled 2018-12-29: qty 1

## 2018-12-29 MED ORDER — ROCURONIUM BROMIDE 10 MG/ML (PF) SYRINGE
PREFILLED_SYRINGE | INTRAVENOUS | Status: AC
  Filled 2018-12-29: qty 10

## 2018-12-29 MED ORDER — ONDANSETRON HCL 4 MG/2ML IJ SOLN
INTRAMUSCULAR | Status: DC | PRN
  Administered 2018-12-29: 4 mg via INTRAVENOUS

## 2018-12-29 MED ORDER — ONDANSETRON HCL 4 MG PO TABS: 4.0000 mg | ORAL_TABLET | Freq: Three times a day (TID) | ORAL | 1 refills | Status: DC | PRN

## 2018-12-29 MED ORDER — OXYCODONE HCL 5 MG PO TABS
ORAL_TABLET | ORAL | Status: AC
  Filled 2018-12-29: qty 1

## 2018-12-29 MED ORDER — ROCURONIUM BROMIDE 50 MG/5ML IV SOSY
PREFILLED_SYRINGE | INTRAVENOUS | Status: DC | PRN
  Administered 2018-12-29: 60 mg via INTRAVENOUS

## 2018-12-29 MED ORDER — BUPIVACAINE HCL (PF) 0.25 % IJ SOLN
INTRAMUSCULAR | Status: DC | PRN
  Administered 2018-12-29: 10 mL
  Administered 2018-12-29: 3 mL

## 2018-12-29 MED ORDER — LIDOCAINE 2% (20 MG/ML) 5 ML SYRINGE
INTRAMUSCULAR | Status: AC
  Filled 2018-12-29: qty 5

## 2018-12-29 SURGICAL SUPPLY — 49 items
APPLICATOR ARISTA FLEXITIP XL (MISCELLANEOUS) IMPLANT
BAG RETRIEVAL 10MM (BASKET)
CABLE HIGH FREQUENCY MONO STRZ (ELECTRODE) IMPLANT
CANISTER SUCT 3000ML PPV (MISCELLANEOUS) IMPLANT
CONT SPEC 4OZ CLIKSEAL STRL BL (MISCELLANEOUS) ×4 IMPLANT
COVER MAYO STAND STRL (DRAPES) ×4 IMPLANT
COVER WAND RF STERILE (DRAPES) ×4 IMPLANT
DERMABOND ADVANCED (GAUZE/BANDAGES/DRESSINGS) ×2
DERMABOND ADVANCED .7 DNX12 (GAUZE/BANDAGES/DRESSINGS) ×2 IMPLANT
DEVICE TROCAR PUNCTURE CLOSURE (ENDOMECHANICALS) ×4 IMPLANT
DISSECTOR BLUNT TIP ENDO 5MM (MISCELLANEOUS) IMPLANT
DRSG OPSITE POSTOP 3X4 (GAUZE/BANDAGES/DRESSINGS) IMPLANT
DURAPREP 26ML APPLICATOR (WOUND CARE) ×4 IMPLANT
ELECT REM PT RETURN 9FT ADLT (ELECTROSURGICAL) ×4
ELECTRODE REM PT RTRN 9FT ADLT (ELECTROSURGICAL) ×2 IMPLANT
GAUZE 4X4 16PLY RFD (DISPOSABLE) ×4 IMPLANT
GLOVE BIO SURGEON STRL SZ 6.5 (GLOVE) ×6 IMPLANT
GLOVE BIO SURGEONS STRL SZ 6.5 (GLOVE) ×2
GLOVE BIOGEL PI IND STRL 7.0 (GLOVE) ×4 IMPLANT
GLOVE BIOGEL PI INDICATOR 7.0 (GLOVE) ×4
GOWN STRL REUS W/TWL LRG LVL3 (GOWN DISPOSABLE) ×8 IMPLANT
HEMOSTAT ARISTA ABSORB 3G PWDR (HEMOSTASIS) IMPLANT
LIGASURE VESSEL 5MM BLUNT TIP (ELECTROSURGICAL) IMPLANT
NEEDLE INSUFFLATION 120MM (ENDOMECHANICALS) ×4 IMPLANT
NS IRRIG 500ML POUR BTL (IV SOLUTION) ×4 IMPLANT
PACK LAPAROSCOPY BASIN (CUSTOM PROCEDURE TRAY) ×4 IMPLANT
PAD OB MATERNITY 4.3X12.25 (PERSONAL CARE ITEMS) ×4 IMPLANT
POUCH LAPAROSCOPIC INSTRUMENT (MISCELLANEOUS) ×4 IMPLANT
POUCH SPECIMEN RETRIEVAL 10MM (ENDOMECHANICALS) ×4 IMPLANT
SCISSORS LAP 5X35 DISP (ENDOMECHANICALS) IMPLANT
SEPRAFILM MEMBRANE 5X6 (MISCELLANEOUS) ×8 IMPLANT
SET IRRIG TUBING LAPAROSCOPIC (IRRIGATION / IRRIGATOR) ×4 IMPLANT
SET IRRIG Y TYPE TUR BLADDER L (SET/KITS/TRAYS/PACK) IMPLANT
SET TUBE SMOKE EVAC HIGH FLOW (TUBING) ×4 IMPLANT
SHEARS HARMONIC ACE PLUS 36CM (ENDOMECHANICALS) IMPLANT
STOPCOCK 4 WAY LG BORE MALE ST (IV SETS) ×4 IMPLANT
SUT VICRYL 0 UR6 27IN ABS (SUTURE) ×4 IMPLANT
SUT VICRYL 4-0 PS2 18IN ABS (SUTURE) ×8 IMPLANT
SYR 50ML LL SCALE MARK (SYRINGE) ×8 IMPLANT
SYS BAG RETRIEVAL 10MM (BASKET)
SYSTEM BAG RETRIEVAL 10MM (BASKET) IMPLANT
SYSTEM CARTER THOMASON II (TROCAR) IMPLANT
TOWEL OR 17X26 10 PK STRL BLUE (TOWEL DISPOSABLE) ×4 IMPLANT
TRAY FOLEY W/BAG SLVR 14FR (SET/KITS/TRAYS/PACK) ×4 IMPLANT
TROCAR ADV FIXATION 11X100MM (TROCAR) IMPLANT
TROCAR ADV FIXATION 5X100MM (TROCAR) ×4 IMPLANT
TROCAR XCEL NON-BLD 11X100MML (ENDOMECHANICALS) ×4 IMPLANT
TROCAR XCEL NON-BLD 5MMX100MML (ENDOMECHANICALS) ×8 IMPLANT
WARMER LAPAROSCOPE (MISCELLANEOUS) ×4 IMPLANT

## 2018-12-29 NOTE — Op Note (Signed)
Preoperative Diagnosis: dyspareunia, dysmenorrhea, complex left ovarian cyst.   Postoperative Diagnosis: Same, adhesions   Procedure:  Diagnostic laparoscopy, attempted left ovarian cystectomy, left oophorectomy, lysis of adhesions.   Surgeon: Dr Gertie Exon  Assistant: Dr Conley Simmonds, an MD assistant was necessary for tissue manipulation, retraction and positioning due to the complexity of the case and hospital policies   Anesthesia: General  EBL: 10 cc  Fluids: 900 cc LR  Urine output: 300 cc  Complications: none  Indications for surgery: The patient is a 28 year old female, who presented with dyspareunia, dysmenorrhea and a complex 2 cm left ovarian cyst consistent with a dermoid. Work up included an exam, STD testing and an ultrasound. She does have some pelvic floor and vaginal pain and is going for pelvic floor physical therapy. She has a nexplanon in place which has helped her dysmenorrhea, but she is still having intermittent pelvic cramping. The patient desires removal of the dermoid, she is aware that the dermoid is small and located within her ovary and removal could lead to scar tissue or removal of her left ovary. We have discussed treatment of possible endometriosis.  The patient is aware of the risks and complications involved with the surgery and consent was obtained prior to the procedure.  Findings: Normal uterus, prominent appearing ovaries bilaterally without an obvious cyst. On cutting into the left ovary sebacous material leaked out of the ovary, unable to find any clear plans to dissect out the presumed dermoid. She had adhesions of her omentum to under her umbilicus and physiologic adhesions of her bowel to her left pelvic side wall making access to the ovary difficult. Normal tube, normal liver edge.   Procedure: The patient was taken to the operating room with an IV in placed. She was placed in the dorsal lithotomy position. General anesthesia was administered.  She was prepped and draped in the usual sterile fashion for an abdominal, vaginal surgery. A hulka uterine manipulator was placed. A foley catheter was placed.   The patient has a history of an umbilical hernia return. The decision was made for left upper quadrant entry. 3 cm under the ribs in the mid clavicular line was injected with 0.25% marcaine and incised with a # 11 blade. A #5 optiview trocar was inserted through the incision into the abdominal cavity. The abdominal cavity was insufflated with CO2, with normal intraabdominal pressures. The patient was placed in trendelenburg and the abdominal pelvic cavity was inspected. 2 more trocars were placed. 1 in either  lower quadrant approximately 3 cm medial to the anterior superior iliac spine. Another trocar was placed approximately 5 cm superior to the pubic symphysis in the midline. These areas were injected with 0.25% marcaine, incised with a #11 blades. A 5 mm trocar was inserted in the right lateral and midline ports and an 11 mm trocar in the left lateral port. All trocars were inserted with direct visualization with the laparoscope. The abdominal pelvic cavity was again inspected. The adhesions of the bowel to the left pelvis/lower abdominal wall were taken down with the harmonic scalpel.  The ureter was identified on the left.   The left ovary was elevated and opened with the harmonic scalpel. Approximately half way into the ovary the cyst was entered and sebaceous material leaked out. It was impossible to find a clear tissue plan between the cyst and the ovary. The decision was made to remove the left ovary. The left infundibulopelvic ligament was taken down with the harmonic  scalpel. The meso-ovarium was taken down with the harmonic scalpel and the uterine ovarian ligament was taken down with the harmonic scalpel. The ovary was placed in an endobag. The 11 trocar was removed the ovary was morcellated in the endobag and removed.   The Endoclose  device was used to place a stich of 0-Vicryl through the fascia of the 11 mm trocar site.  The abdominal pelvic cavity was copiously irrigated and suctioned dry. Pressure was released and hemostasis remained excellent.   The abdominal cavity was desufflated and the trocars were removed. The skin was closed with subcuticular stiches of 4-0 vicryl and dermabond was placed over the incisions.  The foley catheter and the tenaculum were removed.  The patient's abdomen and perineum were cleansed and she was taken out of the dorsal lithotomy position. Upon awakening she was extubated and taken to the recovery room in stable condition. The sponge and instrument counts were correct.

## 2018-12-29 NOTE — Interval H&P Note (Signed)
History and Physical Interval Note:  12/29/2018 9:59 AM  Charlene Griffin  has presented today for surgery, with the diagnosis of dyspareunia, pelvic floor dysfunction.  The various methods of treatment have been discussed with the patient and family. After consideration of risks, benefits and other options for treatment, the patient has consented to  Procedure(s) with comments: LAPAROSCOPY DIAGNOSTIC with possible treatment of endometriosis (N/A) - 1 hour surgery time/ follow Dr Elza Rafter case OVARIAN CYSTECTOMY (N/A) as a surgical intervention.  The patient's history has been reviewed, patient examined, no change in status, stable for surgery.  I have reviewed the patient's chart and labs.  Questions were answered to the patient's satisfaction.     Salvadore Dom

## 2018-12-29 NOTE — Discharge Instructions (Signed)
DISCHARGE INSTRUCTIONS: Laparoscopy  The following instructions have been prepared to help you care for yourself upon your return home today.  Wound care: . Do not get the incision wet for the first 24 hours. The incision should be kept clean and dry. . The Band-Aids or dressings may be removed the day after surgery. . Should the incision become sore, red, and swollen after the first week, check with your doctor.  Personal hygiene: . Shower the day after your procedure.  Activity and limitations: . Do NOT drive or operate any equipment today. . Do NOT lift anything more than 15 pounds for 2-3 weeks after surgery. . Do NOT rest in bed all day. . Walking is encouraged. Walk each day, starting slowly with 5-minute walks 3 or 4 times a day. Slowly increase the length of your walks. . Walk up and down stairs slowly. . Do NOT do strenuous activities, such as golfing, playing tennis, bowling, running, biking, weight lifting, gardening, mowing, or vacuuming for 2-4 weeks. Ask your doctor when it is okay to start.  Diet: Eat a light meal as desired this evening. You may resume your usual diet tomorrow.  Return to work: This is dependent on the type of work you do. For the most part you can return to a desk job within a week of surgery. If you are more active at work, please discuss this with your doctor.  What to expect after your surgery: You may have a slight burning sensation when you urinate on the first day. You may have a very small amount of blood in the urine. Expect to have a small amount of vaginal discharge/light bleeding for 1-2 weeks. It is not unusual to have abdominal soreness and bruising for up to 2 weeks. You may be tired and need more rest for about 1 week. You may experience shoulder pain for 24-72 hours. Lying flat in bed may relieve it.  Call your doctor for any of the following: . Develop a fever of 100.4 or greater . Inability to urinate 6 hours after discharge from  hospital . Severe pain not relieved by pain medications . Persistent of heavy bleeding at incision site . Redness or swelling around incision site after a week . Increasing nausea or vomiting   Post Anesthesia Home Care Instructions  Activity: Get plenty of rest for the remainder of the day. A responsible adult should stay with you for 24 hours following the procedure.  For the next 24 hours, DO NOT: -Drive a car -Operate machinery -Drink alcoholic beverages -Take any medication unless instructed by your physician -Make any legal decisions or sign important papers.  Meals: Start with liquid foods such as gelatin or soup. Progress to regular foods as tolerated. Avoid greasy, spicy, heavy foods. If nausea and/or vomiting occur, drink only clear liquids until the nausea and/or vomiting subsides. Call your physician if vomiting continues.  Special Instructions/Symptoms: Your throat may feel dry or sore from the anesthesia or the breathing tube placed in your throat during surgery. If this causes discomfort, gargle with warm salt water. The discomfort should disappear within 24 hours.  If you had a scopolamine patch placed behind your ear for the management of post- operative nausea and/or vomiting:  1. The medication in the patch is effective for 72 hours, after which it should be removed.  Wrap patch in a tissue and discard in the trash. Wash hands thoroughly with soap and water. 2. You may remove the patch earlier than 72   hours if you experience unpleasant side effects which may include dry mouth, dizziness or visual disturbances. 3. Avoid touching the patch. Wash your hands with soap and water after contact with the patch.    

## 2018-12-29 NOTE — Anesthesia Procedure Notes (Signed)
Procedure Name: Intubation Date/Time: 12/29/2018 10:26 AM Performed by: Bonney Aid, CRNA Pre-anesthesia Checklist: Patient identified, Emergency Drugs available, Suction available and Patient being monitored Patient Re-evaluated:Patient Re-evaluated prior to induction Oxygen Delivery Method: Circle system utilized Preoxygenation: Pre-oxygenation with 100% oxygen Induction Type: IV induction Ventilation: Mask ventilation without difficulty Laryngoscope Size: Mac and 3 Grade View: Grade I Tube type: Oral Tube size: 7.0 mm Number of attempts: 1 Airway Equipment and Method: Stylet Placement Confirmation: ETT inserted through vocal cords under direct vision,  positive ETCO2 and breath sounds checked- equal and bilateral Secured at: 20 cm Tube secured with: Tape Dental Injury: Teeth and Oropharynx as per pre-operative assessment

## 2018-12-29 NOTE — Transfer of Care (Signed)
Immediate Anesthesia Transfer of Care Note  Patient: Charlene Griffin  Procedure(s) Performed: LAPAROSCOPY DIAGNOSTIC (N/A Abdomen) LAPARASCOPIC OOPHARECTOMY (Left Abdomen)  Patient Location: PACU  Anesthesia Type:General  Level of Consciousness: drowsy  Airway & Oxygen Therapy: Patient Spontanous Breathing and Patient connected to nasal cannula oxygen  Post-op Assessment: Report given to RN  Post vital signs: Reviewed and stable  Last Vitals:  Vitals Value Taken Time  BP 112/69 12/29/18 1142  Temp 36.4 C 12/29/18 1142  Pulse 91 12/29/18 1145  Resp 19 12/29/18 1145  SpO2 100 % 12/29/18 1145  Vitals shown include unvalidated device data.  Last Pain:  Vitals:   12/29/18 0902  TempSrc:   PainSc: 0-No pain      Patients Stated Pain Goal: 6 (38/32/91 [id number])  Complications: No apparent anesthesia complications

## 2018-12-29 NOTE — Anesthesia Postprocedure Evaluation (Signed)
Anesthesia Post Note  Patient: Charlene Griffin  Procedure(s) Performed: LAPAROSCOPY DIAGNOSTIC (N/A Abdomen) LAPARASCOPIC OOPHARECTOMY (Left Abdomen)     Patient location during evaluation: PACU Anesthesia Type: General Level of consciousness: awake and alert Pain management: pain level controlled Vital Signs Assessment: post-procedure vital signs reviewed and stable Respiratory status: spontaneous breathing, nonlabored ventilation, respiratory function stable and patient connected to nasal cannula oxygen Cardiovascular status: blood pressure returned to baseline and stable Postop Assessment: no apparent nausea or vomiting Anesthetic complications: no    Last Vitals:  Vitals:   12/29/18 1245 12/29/18 1315  BP: 128/70 117/68  Pulse: 82   Resp: 13   Temp:    SpO2: 97%     Last Pain:  Vitals:   12/29/18 1315  TempSrc:   PainSc: 8                  Barnet Glasgow

## 2018-12-30 ENCOUNTER — Telehealth: Payer: Self-pay | Admitting: Obstetrics and Gynecology

## 2018-12-30 NOTE — Telephone Encounter (Signed)
Patient want to know if she can take melatonin.

## 2018-12-30 NOTE — Telephone Encounter (Signed)
Spoke with pt. Dr Talbert Nan advised ok to take melatonin. Now c/o pain is back and not feeling well. Thinks "she is too exhausted" to let pain meds work and needs sleep. Will take melatonin now and will try to get more than 20 min nap. Pt states having gas pain and advised can take mylicon tabs or gas X to help relieve, but also walking and drinking water will help. Pt agreeable. Pt reminded to call back for update on 12/23. Pt agreeable.   Will route to Dr Talbert Nan for review.

## 2018-12-30 NOTE — Telephone Encounter (Signed)
Spoke to pt. Pt given update from Dr Gentry Fitz recommendations.Pt has 15 tablets left d/t taking 1 Oxy starting at 6pm last night every 4 hours.  Will increase Oxy to 2 tabs every 4 hrs for next 24 hrs and will get and add Tylenol  to regimen. Pt states already taking 3 doses of Ibuprofen since surgery every 8 hrs. Encouraged to keep taking. Pt states has passed gas, and has burping, no BM yet.  Pt has been eating soft diet due to sore throat d/t surgery tube. Has been eating, no nausea since taking Zofran and is drinking a lot and urinating with clear urine. Encouraged to keep drinking and hydrating due to possible constipation with medications. Pt agreeable. Pt to get a warm shower to help with sleep and relaxation. Given instructions for no bath and no scrubbing of incision sites. Pt verbalized understanding.   Will route to Dr Talbert Nan for review and any additional recommendations.

## 2018-12-30 NOTE — Telephone Encounter (Signed)
If she isn't feeling better, she should be seen.

## 2018-12-30 NOTE — Telephone Encounter (Signed)
Forgot to add that pt instructed to call back tomorrow 12/31/18 to give update on meds and pain.   Will route to Dr Talbert Nan.

## 2018-12-30 NOTE — Telephone Encounter (Signed)
Patient has surgery yesterday and was told to call with an update.

## 2018-12-30 NOTE — Telephone Encounter (Signed)
Spoke to pts husband. Ok per [employer].Marland Kitchen Pt sleeping off and on for last hour and seems more comfortable. Informed of no improvement over night should be seen tomorrow 12/23. Pt's husband Jeneen Rinks verbalize understanding.

## 2018-12-30 NOTE — Telephone Encounter (Signed)
She can increase to 2 oxycodone every 4 hours (find out how many she has left). She should take the ibuprofen 800 mg q8 hours around the clock and then she can add extra strength tylenol 1,000 mg q 6 hour.  Please make sure she doesn't have a fever, her pain shouldn't be worsening, check if she is eating, drinking, voiding.  If her pain is worsening or she develops a fever, she should be seen.

## 2018-12-30 NOTE — Telephone Encounter (Signed)
Spoke to pt. Pt states pain meds aren't working. Only pain meds working for 1 hour of 4 hrs of meds since having surgery from yesterday. Pt not sleeping either. Wants to know what to do for pain and sleep.   Will route to Dr Talbert Nan for recommendations and advice.

## 2018-12-30 NOTE — Telephone Encounter (Signed)
Please check on the patient in the am and see how she is doing with her pain and medication. I may need to call in a refill for her oxycodone. Thanks!

## 2018-12-31 ENCOUNTER — Emergency Department (HOSPITAL_COMMUNITY)
Admission: EM | Admit: 2018-12-31 | Discharge: 2018-12-31 | Disposition: A | Discharge status: Home / Self Care | Attending: Emergency Medicine | Admitting: Emergency Medicine

## 2018-12-31 ENCOUNTER — Other Ambulatory Visit: Payer: Self-pay

## 2018-12-31 ENCOUNTER — Encounter (HOSPITAL_COMMUNITY): Payer: Self-pay | Admitting: Emergency Medicine

## 2018-12-31 ENCOUNTER — Emergency Department (HOSPITAL_COMMUNITY)

## 2018-12-31 DIAGNOSIS — R05 Cough: Secondary | ICD-10-CM | POA: Diagnosis not present

## 2018-12-31 DIAGNOSIS — G8918 Other acute postprocedural pain: Secondary | ICD-10-CM | POA: Diagnosis not present

## 2018-12-31 DIAGNOSIS — R109 Unspecified abdominal pain: Secondary | ICD-10-CM | POA: Diagnosis present

## 2018-12-31 DIAGNOSIS — J40 Bronchitis, not specified as acute or chronic: Secondary | ICD-10-CM

## 2018-12-31 DIAGNOSIS — Z720 Tobacco use: Secondary | ICD-10-CM | POA: Insufficient documentation

## 2018-12-31 LAB — CBC WITH DIFFERENTIAL/PLATELET
Abs Immature Granulocytes: 0.02 10*3/uL (ref 0.00–0.07)
Basophils Absolute: 0 10*3/uL (ref 0.0–0.1)
Basophils Relative: 0 %
Eosinophils Absolute: 0.1 10*3/uL (ref 0.0–0.5)
Eosinophils Relative: 1 %
HCT: 36.4 % (ref 36.0–46.0)
Hemoglobin: 12 g/dL (ref 12.0–15.0)
Immature Granulocytes: 0 %
Lymphocytes Relative: 35 %
Lymphs Abs: 2.8 10*3/uL (ref 0.7–4.0)
MCH: 30.8 pg (ref 26.0–34.0)
MCHC: 33 g/dL (ref 30.0–36.0)
MCV: 93.6 fL (ref 80.0–100.0)
Monocytes Absolute: 0.6 10*3/uL (ref 0.1–1.0)
Monocytes Relative: 7 %
Neutro Abs: 4.5 10*3/uL (ref 1.7–7.7)
Neutrophils Relative %: 57 %
Platelets: 267 10*3/uL (ref 150–400)
RBC: 3.89 MIL/uL (ref 3.87–5.11)
RDW: 13 % (ref 11.5–15.5)
WBC: 8 10*3/uL (ref 4.0–10.5)
nRBC: 0 % (ref 0.0–0.2)

## 2018-12-31 LAB — URINALYSIS, ROUTINE W REFLEX MICROSCOPIC
Bacteria, UA: NONE SEEN
Bilirubin Urine: NEGATIVE
Glucose, UA: NEGATIVE mg/dL
Ketones, ur: NEGATIVE mg/dL
Leukocytes,Ua: NEGATIVE
Nitrite: NEGATIVE
Protein, ur: NEGATIVE mg/dL
Specific Gravity, Urine: 1.006 (ref 1.005–1.030)
pH: 6 (ref 5.0–8.0)

## 2018-12-31 LAB — COMPREHENSIVE METABOLIC PANEL
ALT: 28 U/L (ref 0–44)
AST: 25 U/L (ref 15–41)
Albumin: 3.7 g/dL (ref 3.5–5.0)
Alkaline Phosphatase: 33 U/L — ABNORMAL LOW (ref 38–126)
Anion gap: 8 (ref 5–15)
BUN: 9 mg/dL (ref 6–20)
CO2: 26 mmol/L (ref 22–32)
Calcium: 8.8 mg/dL — ABNORMAL LOW (ref 8.9–10.3)
Chloride: 104 mmol/L (ref 98–111)
Creatinine, Ser: 0.71 mg/dL (ref 0.44–1.00)
GFR calc Af Amer: >60 mL/min (ref >60)
GFR calc non Af Amer: >60 mL/min (ref >60)
Glucose, Bld: 86 mg/dL (ref 70–99)
Potassium: 3.3 mmol/L — ABNORMAL LOW (ref 3.5–5.1)
Sodium: 138 mmol/L (ref 135–145)
Total Bilirubin: 0.5 mg/dL (ref 0.3–1.2)
Total Protein: 6.1 g/dL — ABNORMAL LOW (ref 6.5–8.1)

## 2018-12-31 LAB — I-STAT BETA HCG BLOOD, ED (MC, WL, AP ONLY): I-stat hCG, quantitative: <5 m[IU]/mL (ref <5)

## 2018-12-31 LAB — LACTIC ACID, PLASMA: Lactic Acid, Venous: 0.9 mmol/L (ref 0.5–1.9)

## 2018-12-31 LAB — SURGICAL PATHOLOGY

## 2018-12-31 MED ORDER — FLUCONAZOLE 200 MG PO TABS: 200.0000 mg | ORAL_TABLET | Freq: Every day | ORAL | 0 refills | Status: AC

## 2018-12-31 MED ORDER — AMOXICILLIN-POT CLAVULANATE 875-125 MG PO TABS: 1.0000 | ORAL_TABLET | Freq: Two times a day (BID) | ORAL | 0 refills | Status: AC

## 2018-12-31 MED ORDER — BENZONATATE 100 MG PO CAPS: 100.0000 mg | ORAL_CAPSULE | Freq: Three times a day (TID) | ORAL | 0 refills | Status: DC

## 2018-12-31 MED ORDER — OXYCODONE HCL 5 MG PO TABS: 5.0000 mg | ORAL_TABLET | ORAL | 0 refills | Status: DC | PRN

## 2018-12-31 MED ORDER — OXYCODONE-ACETAMINOPHEN 5-325 MG PO TABS: 1.0000 | ORAL_TABLET | ORAL | 0 refills | Status: DC | PRN

## 2018-12-31 MED ORDER — OXYCODONE-ACETAMINOPHEN 5-325 MG PO TABS
1.0000 | ORAL_TABLET | Freq: Once | ORAL | Status: AC
  Administered 2018-12-31: 1 via ORAL
  Filled 2018-12-31: qty 1

## 2018-12-31 MED ORDER — SODIUM CHLORIDE 0.9% FLUSH: 3.0000 mL | Freq: Once | INTRAVENOUS | Status: DC

## 2018-12-31 NOTE — Telephone Encounter (Signed)
Given that her pain is so significant and she is having a bad cough, I would recommend that she be seen in the ER

## 2018-12-31 NOTE — ED Notes (Signed)
Patient verbalizes understanding of discharge instructions. Opportunity for questioning and answers were provided. Armband removed by staff, pt discharged from ED ambulatory to home.  

## 2018-12-31 NOTE — Telephone Encounter (Signed)
Patient is calling stating that she has questions about her medication.

## 2018-12-31 NOTE — ED Notes (Addendum)
Per Dr. Sedonia Small, Second Lactic Acid does not need to be drawn

## 2018-12-31 NOTE — Discharge Instructions (Addendum)
You were evaluated in the Emergency Department and after careful evaluation, we did not find any emergent condition requiring admission or further testing in the hospital.  Your exam/testing today was overall reassuring.  Please take the Augmentin antibiotic as directed.  We recommend Tylenol 1000 mg every 8 hours during the day to help with your pain.  You can use the Tessalon medication as needed for cough.  For more significant pain, you can use the oxycodone medication.  You should be contacted soon by your GYN surgeon.  Please return to the Emergency Department if you experience any worsening of your condition.  We encourage you to follow up with a primary care provider.  Thank you for allowing [place] to be a part of your care.

## 2018-12-31 NOTE — Telephone Encounter (Signed)
Spoke to pt. Pt had question about taking too much tylenol per bottle instructions.  Clarified with pt to take 1000mg  every 6 hours. Also, pt developed cough last night with sore throat that's deep and croupy sounding, non productive. Pt denies fever or chills. States still didn't sleep well last night being up every 1.5 hrs. C/o gas pain in shoulders and "bubble" near diagphram, encouraged pt to drink water and walk around. Pt verbalized understanding and agreeable.  Pt only has 4 tabs left of Oxy and wanted to know if needs RF or maybe different meds to help with pain that is on pain scale of 7 while taking the 2 Oxy.   Will route to Dr Talbert Nan for recommendations and advice. Will return call to pt.

## 2018-12-31 NOTE — ED Triage Notes (Addendum)
Pt states she had surgery on Monday to remove an ovarian cyst but ended up having an oophorectomy. States that she is still having severe abdominal pain and today developed a cough. Had a neg covid test on Thursday and quarantined as well since then. Surgeon advised her to come to the ED as cough may be related to surgery/intubation. Unsure of fevers due to around the clock ibuprofen but denies any recent sick contacts.

## 2018-12-31 NOTE — ED Provider Notes (Signed)
[facility] Emergency Department Provider Note MRN:  [id number]  Arrival date & time: 12/31/18     Chief Complaint   Post-op Problem and Cough   History of Present Illness   Charlene Griffin is a 28 y.o. year-old female with a history of GERD, anxiety presenting to the ED with chief complaint of postop pain.  Patient is postop day 2 from ovarian cyst removal.  She explains that the entire ovary was removed on the left side.  She is complaining of continued abdominal pain and discomfort despite using the oxycodone tablets at home.  She is also endorsing cough ever since she was discharged home.  The cough is making her abdominal pain worse with coughing.  Denies fever, no chest pain, no shortness of breath, no dysuria.  Symptoms moderate, constant, no other exacerbating or alleviating factors.  Review of Systems  A complete 10 system review of systems was obtained and all systems are negative except as noted in the HPI and PMH.   Patient's Health History    Past Medical History:  Diagnosis Date  . Anxiety   . Chronic headaches   . Depression   . GERD (gastroesophageal reflux disease)   . Migraine without aura   . Umbilical hernia     Past Surgical History:  Procedure Laterality Date  . HERNIA REPAIR     x 2  . LAPAROSCOPY N/A 12/29/2018   Procedure: LAPAROSCOPY DIAGNOSTIC;  Surgeon: Salvadore Dom, MD;  Location: [facility];  Service: Gynecology;  Laterality: N/A;  1 hour surgery time/ follow Dr Elza Rafter case  . OVARIAN CYST REMOVAL Left 12/29/2018   Procedure: LAPARASCOPIC OOPHARECTOMY;  Surgeon: Salvadore Dom, MD;  Location: [facility];  Service: Gynecology;  Laterality: Left;    Family History  Problem Relation Age of Onset  . Colon cancer Maternal Grandfather        passed away 04-22-2012    Social History   Socioeconomic History  . Marital status: Married    Spouse name: Not on file  . Number of  children: Not on file  . Years of education: Not on file  . Highest education level: Not on file  Occupational History  . Not on file  Tobacco Use  . Smoking status: Current Some Day Smoker    Types: Cigarettes  . Smokeless tobacco: Never Used  Substance and Sexual Activity  . Alcohol use: Yes    Comment: socially  . Drug use: Never  . Sexual activity: Yes    Birth control/protection: Implant  Other Topics Concern  . Not on file  Social History Narrative  . Not on file   Social Determinants of Health   Financial Resource Strain:   . Difficulty of Paying Living Expenses: Not on file  Food Insecurity:   . Worried About Charity fundraiser in the Last Year: Not on file  . Ran Out of Food in the Last Year: Not on file  Transportation Needs:   . Lack of Transportation (Medical): Not on file  . Lack of Transportation (Non-Medical): Not on file  Physical Activity:   . Days of Exercise per Week: Not on file  . Minutes of Exercise per Session: Not on file  Stress:   . Feeling of Stress : Not on file  Social Connections:   . Frequency of Communication with Friends and Family: Not on file  . Frequency of Social Gatherings with Friends and Family: Not on file  .  Attends Religious Services: Not on file  . Active Member of Clubs or Organizations: Not on file  . Attends BankerClub or Organization Meetings: Not on file  . Marital Status: Not on file  Intimate Partner Violence:   . Fear of Current or Ex-Partner: Not on file  . Emotionally Abused: Not on file  . Physically Abused: Not on file  . Sexually Abused: Not on file     Physical Exam  Vital Signs and Nursing Notes reviewed Vitals:   12/31/18 1209 12/31/18 1549  BP: (!) 121/55 117/76  Pulse: 69   Resp: 16 11  Temp: (!) 97.5 F (36.4 C)   SpO2: 100%     CONSTITUTIONAL: Well-appearing, NAD NEURO:  Alert and oriented x 3, no focal deficits EYES:  eyes equal and reactive ENT/NECK:  no LAD, no JVD CARDIO: Regular rate,  well-perfused, normal S1 and S2 PULM:  CTAB no wheezing or rhonchi GI/GU:  normal bowel sounds, non-distended, minimal diffuse tenderness; well-appearing multiple laparoscopic incision sites, well-healing MSK/SPINE:  No gross deformities, no edema SKIN:  no rash, atraumatic PSYCH:  Appropriate speech and behavior  Diagnostic and Interventional Summary    EKG Interpretation  Date/Time:    Ventricular Rate:    PR Interval:    QRS Duration:   QT Interval:    QTC Calculation:   R Axis:     Text Interpretation:        Labs Reviewed  COMPREHENSIVE METABOLIC PANEL - Abnormal; Notable for the following components:      Result Value   Potassium 3.3 (*)    Calcium 8.8 (*)    Total Protein 6.1 (*)    Alkaline Phosphatase 33 (*)    All other components within normal limits  URINALYSIS, ROUTINE W REFLEX MICROSCOPIC - Abnormal; Notable for the following components:   Color, Urine STRAW (*)    Hgb urine dipstick SMALL (*)    All other components within normal limits  CBC WITH DIFFERENTIAL/PLATELET  LACTIC ACID, PLASMA  LACTIC ACID, PLASMA  I-STAT BETA HCG BLOOD, ED ([facility], [facility], [facility] ONLY)    DG Chest 2 View  Final Result      Medications  sodium chloride flush (NS) 0.9 % injection 3 mL (has no administration in time range)  oxyCODONE-acetaminophen (PERCOCET/ROXICET) 5-325 MG per tablet 1 tablet (1 tablet Oral Given 12/31/18 1440)     Procedures  /  Critical Care Procedures  ED Course and Medical Decision Making  I have reviewed the triage vital signs and the nursing notes.  Pertinent labs & imaging results that were available during my care of the patient were reviewed by me and considered in my medical decision making (see below for details).     Question of postop pneumonia in this 28 year old female with recent oophorectomy.  Her abdomen is soft, her incision sites are well-healing, her labs are very reassuring with no leukocytosis.  Chest x-ray revealing some evidence of  bronchitis.  She was intubated for this procedure and so would lean towards treating this with antibiotics.  Will attempt to reach out to her surgeon to make them aware and see if they have any other recommendations.  Patient states that after some oxycodone here in the emergency department this is the best her pain has been controlled over the past 2 days.  Feeling well right now.  Ambulating without issue.  4:10 PM update: Discussed with Dr. Hyacinth MeekerMiller who is on-call for Dr. Oscar LaJertson, who agrees that patient is appropriate for discharge  with close follow-up in their clinic.  Elmer Sow. Pilar Plate, MD [facility] Emergency Medicine [facility] [email or web address]  Final Clinical Impressions(s) / ED Diagnoses     ICD-10-CM   1. Post-op pain  G89.18   2. Bronchitis  J40     ED Discharge Orders         Ordered    oxyCODONE (ROXICODONE) 5 MG immediate release tablet  Every 4 hours PRN     12/31/18 1609    benzonatate (TESSALON) 100 MG capsule  Every 8 hours     12/31/18 1609    amoxicillin-clavulanate (AUGMENTIN) 875-125 MG tablet  Every 12 hours     12/31/18 1609           Discharge Instructions Discussed with and Provided to Patient:     Discharge Instructions     You were evaluated in the Emergency Department and after careful evaluation, we did not find any emergent condition requiring admission or further testing in the hospital.  Your exam/testing today was overall reassuring.  Please take the Augmentin antibiotic as directed.  We recommend Tylenol 1000 mg every 8 hours during the day to help with your pain.  You can use the Tessalon medication as needed for cough.  For more significant pain, you can use the oxycodone medication.  You should be contacted soon by your GYN surgeon.  Please return to the Emergency Department if you experience any worsening of your condition.  We encourage you to follow up with a primary care provider.  Thank you for allowing [place] to be a part  of your care.        Sabas Sous, MD 12/31/18 [phone]

## 2019-01-03 ENCOUNTER — Telehealth: Payer: Self-pay | Admitting: Obstetrics and Gynecology

## 2019-01-03 MED ORDER — SENNA 8.6 MG PO TABS: 1.0000 | ORAL_TABLET | Freq: Every evening | ORAL | 0 refills | Status: DC | PRN

## 2019-01-03 MED ORDER — ONDANSETRON HCL 8 MG PO TABS: 4.0000 mg | ORAL_TABLET | Freq: Three times a day (TID) | ORAL | 0 refills | Status: DC | PRN

## 2019-01-03 NOTE — Telephone Encounter (Signed)
States she has struggled since surgery.  She had a laparoscopic left oophorectomy on 12/29/18 with Dr. Talbert Nan.   Pain is a 7 - 8 since surgery.  She went to the ER on 12/31/18 for abdominal pain and cough, and she was dx with bronchitis. She was discharged to home with Rx for Augmentin and Tessalon.   States her nausea has been bad since she has taken off her patch.  States that her pain is still there but that the nausea is worse than the pain.  Her daughter kneed her in her abdomen along her incision by accident.  She is not taking her pain medication because the nausea makes it not worth it.  Vomited last 2 days ago.  Nausea is nonstop.  Keeping fluids and food down.  Taking Zofran 4 mg and not getting relief.  Had a soft stool yesterday.  First BM in several days. Is passing gas.  No fever or chills.  States her cough is almost suppressed.  She is exhausted and states she just wants to be able to sleep.  Reports Melatonin does not work.  States Benadryl makes her nauseous.   Her husband is able to care for her child.   I invited her to return to the ER for reassessment, and she declines.  New Rx for Zofran 8 mg po q 8 hours prn.  We discussed constipation is side effect. She will take Senna-S to try to get her bowel function moving well.  Use a heating pad prn.  She will call back if needed.

## 2019-01-05 NOTE — Telephone Encounter (Signed)
Spoke with patient, advised per Dr. Talbert Nan. Patient declined Ambien RX. OV scheduled for 12/29 at 1:15pm.   Routing to provider for final review. Patient is agreeable to disposition. Will close encounter.

## 2019-01-05 NOTE — Telephone Encounter (Signed)
Please call and check on the patient. If she is still not sleeping I can call in some ambien for her.

## 2019-01-05 NOTE — Progress Notes (Signed)
GYNECOLOGY  VISIT   HPI: 28 y.o.   Married White or Caucasian Not Hispanic or Latino  female   G0P0000 with No LMP recorded (lmp unknown). Patient has had an implant.   here for evaluation of post op pain and nausea. She is 8 days s/p laparoscopy, left oophorectomy, lysis of adhesions.   She vomited last night and this am, makes her pain worse. She also feels her pain make her vomit.  Her pain is the worst over her left lower quadrant incision (where she had fascial closure).  She was seen in the ER last week, treated for bronchitis. Cough is much better. WBC was normal last week.  No fever. She had a BM on 01/02/19 and 01/04/19 (loose, after a laxative). She is voiding fine, pale yellow. She is taking Ibuprofen, not helping.    GYNECOLOGIC HISTORY: No LMP recorded (lmp unknown). Patient has had an implant. Contraception:Nexplanon Menopausal hormone therapy: none        OB History    Gravida  0   Para  0   Term  0   Preterm  0   AB  0   Living  0     SAB  0   TAB  0   Ectopic  0   Multiple  0   Live Births  0              Patient Active Problem List   Diagnosis Date Noted  . Migraine without aura   . Anal fissure 02/16/2016  . Rectal bleeding 02/16/2016  . Anal pain 02/16/2016    Past Medical History:  Diagnosis Date  . Anxiety   . Chronic headaches   . Depression   . GERD (gastroesophageal reflux disease)   . Migraine without aura   . Umbilical hernia     Past Surgical History:  Procedure Laterality Date  . HERNIA REPAIR     x 2  . LAPAROSCOPY N/A 12/29/2018   Procedure: LAPAROSCOPY DIAGNOSTIC;  Surgeon: Romualdo Bolk, MD;  Location: [facility];  Service: Gynecology;  Laterality: N/A;  1 hour surgery time/ follow Dr Rica Records case  . OVARIAN CYST REMOVAL Left 12/29/2018   Procedure: LAPARASCOPIC OOPHARECTOMY;  Surgeon: Romualdo Bolk, MD;  Location: [facility];  Service: Gynecology;  Laterality: Left;     Current Outpatient Medications  Medication Sig Dispense Refill  . amoxicillin-clavulanate (AUGMENTIN) 875-125 MG tablet Take 1 tablet by mouth every 12 (twelve) hours for 7 days. 14 tablet 0  . etonogestrel (NEXPLANON) 68 MG IMPL implant 68 mg by Subdermal route once.    . Galcanezumab-gnlm (EMGALITY) 120 MG/ML SOAJ Inject into the skin every 30 (thirty) days.    Marland Kitchen ibuprofen (ADVIL) 800 MG tablet Take 1 tablet (800 mg total) by mouth every 8 (eight) hours as needed. 30 tablet 0  . lidocaine (XYLOCAINE) 5 % ointment Apply topically 20 minutes prior to intercourse, wipe off just prior to intercourse. 30 g 0  . ondansetron (ZOFRAN) 8 MG tablet Take 0.5 tablets (4 mg total) by mouth every 8 (eight) hours as needed for nausea or vomiting. 20 tablet 0  . Rimegepant Sulfate (NURTEC) 75 MG TBDP Take by mouth.    . senna (SENOKOT) 8.6 MG TABS tablet Take 1 tablet (8.6 mg total) by mouth at bedtime as needed for moderate constipation. 30 tablet 0  . verapamil (CALAN) 40 MG tablet Take 1 tablet by mouth as needed.     No  current facility-administered medications for this visit.     ALLERGIES: Botox [botulinum toxin type a]  Family History  Problem Relation Age of Onset  . Colon cancer Maternal Grandfather        passed away 05/07/2012    Social History   Socioeconomic History  . Marital status: Married    Spouse name: Not on file  . Number of children: Not on file  . Years of education: Not on file  . Highest education level: Not on file  Occupational History  . Not on file  Tobacco Use  . Smoking status: Current Some Day Smoker    Types: Cigarettes  . Smokeless tobacco: Never Used  Substance and Sexual Activity  . Alcohol use: Yes    Comment: socially  . Drug use: Never  . Sexual activity: Yes    Birth control/protection: Implant  Other Topics Concern  . Not on file  Social History Narrative  . Not on file   Social Determinants of Health   Financial Resource Strain:   .  Difficulty of Paying Living Expenses: Not on file  Food Insecurity:   . Worried About Charity fundraiser in the Last Year: Not on file  . Ran Out of Food in the Last Year: Not on file  Transportation Needs:   . Lack of Transportation (Medical): Not on file  . Lack of Transportation (Non-Medical): Not on file  Physical Activity:   . Days of Exercise per Week: Not on file  . Minutes of Exercise per Session: Not on file  Stress:   . Feeling of Stress : Not on file  Social Connections:   . Frequency of Communication with Friends and Family: Not on file  . Frequency of Social Gatherings with Friends and Family: Not on file  . Attends Religious Services: Not on file  . Active Member of Clubs or Organizations: Not on file  . Attends Archivist Meetings: Not on file  . Marital Status: Not on file  Intimate Partner Violence:   . Fear of Current or Ex-Partner: Not on file  . Emotionally Abused: Not on file  . Physically Abused: Not on file  . Sexually Abused: Not on file    Review of Systems  Constitutional: Negative.   HENT: Negative.   Eyes: Negative.   Respiratory: Negative.   Cardiovascular: Negative.   Gastrointestinal: Positive for abdominal pain, constipation, nausea and vomiting.  Musculoskeletal: Negative.   Skin: Negative.   Neurological: Negative.   Endo/Heme/Allergies: Negative.   Psychiatric/Behavioral: Negative.     PHYSICAL EXAMINATION:    BP 110/60 (BP Location: Right Arm, Patient Position: Sitting, Cuff Size: Normal)   Pulse 68   Temp (!) 97.5 F (36.4 C) (Temporal)   Resp 12   Wt 160 lb 3.2 oz (72.7 kg)   LMP  (LMP Unknown)   BMI 25.86 kg/m     General appearance: alert, cooperative and appears stated age Abdomen: soft, tender over her left lower quadrant incision and in her epigastric region, otherwise not tender; non distended, no masses,  no organomegaly Incisions are clean, dry and intact without erythema or induration.     Reviewed  surgery photos with the patient  ASSESSMENT Post op pain, worsened with emesis. Benign abdominal exam Nausea and emesis, feels worsened by the pain.     PLAN Will treat with phenergan Can continue ibuprofen and tylenol 10 more oxycodone given, use only if absolutely needed F/U next week   An After Visit  Summary was printed and given to the patient.

## 2019-01-05 NOTE — Telephone Encounter (Signed)
~  7% of people will have some nausea with Ambien, so she may not want it.  Please offer her an appointment tomorrow. I'm concerned that she isn't feeling better.

## 2019-01-05 NOTE — Telephone Encounter (Signed)
Spoke with patient.   Nausea has not resolved, Zofran 8mg  q8 hrs not helping, no change. No vomiting. No appetite. drinking fluids, "not enough".   Solid BM on 12/25. Took laxative on 12/26. One episode of "diarrhea" on 12/27.   Slept better on 12/27, request Rx for Ambien if not going to make nausea worse. Confirmed pharmacy.   Stopped taking pain medication due to nausea, pain 6/10.   Is scheduled for post-op on 1/4. States she has taken the rest of the week off of work.   Cough has resolved.   Advised I will provide update to Dr. Talbert Nan, our office will return call.   Routing to Dr. Talbert Nan

## 2019-01-06 ENCOUNTER — Ambulatory Visit (INDEPENDENT_AMBULATORY_CARE_PROVIDER_SITE_OTHER): Admitting: Obstetrics and Gynecology

## 2019-01-06 ENCOUNTER — Encounter: Payer: Self-pay | Admitting: Obstetrics and Gynecology

## 2019-01-06 ENCOUNTER — Other Ambulatory Visit: Payer: Self-pay

## 2019-01-06 VITALS — BP 110/60 | HR 68 | Temp 97.5°F | Resp 12 | Wt 160.2 lb

## 2019-01-06 DIAGNOSIS — G8918 Other acute postprocedural pain: Secondary | ICD-10-CM

## 2019-01-06 DIAGNOSIS — R112 Nausea with vomiting, unspecified: Secondary | ICD-10-CM

## 2019-01-06 MED ORDER — OXYCODONE HCL 5 MG PO CAPS: 5.0000 mg | ORAL_CAPSULE | Freq: Four times a day (QID) | ORAL | 0 refills | Status: DC | PRN

## 2019-01-06 MED ORDER — PROMETHAZINE HCL 12.5 MG PO TABS: ORAL_TABLET | ORAL | 0 refills | Status: DC

## 2019-01-07 ENCOUNTER — Encounter: Payer: Self-pay | Admitting: Obstetrics and Gynecology

## 2019-01-07 ENCOUNTER — Telehealth: Payer: Self-pay | Admitting: Obstetrics and Gynecology

## 2019-01-07 NOTE — Telephone Encounter (Signed)
Patient would like to speak with nurse about medication.

## 2019-01-07 NOTE — Telephone Encounter (Signed)
Spoke with patient. Calling to provide update.   Phenergan and Oxycodone IR are working well, no nausea or vomiting. Patient is not drowsy when taking Phenergan, feels "more awake".  Patient is asking if she can still request Rx for Ambien for sleep?   Advised patient Dr. Talbert Nan is out of the office, she will return on 01/11/18. Advised I will send to her to review, response may not be immediate. Patient verbalizes understanding and is agreeable.   Dr. Talbert Nan -please advise on RX.

## 2019-01-08 ENCOUNTER — Telehealth: Payer: Self-pay | Admitting: Obstetrics and Gynecology

## 2019-01-08 ENCOUNTER — Other Ambulatory Visit: Payer: Self-pay | Admitting: Obstetrics & Gynecology

## 2019-01-08 ENCOUNTER — Other Ambulatory Visit: Payer: Self-pay

## 2019-01-08 MED ORDER — ZOLPIDEM TARTRATE 10 MG PO TABS: 10.0000 mg | ORAL_TABLET | Freq: Every evening | ORAL | 0 refills | Status: DC | PRN

## 2019-01-08 MED ORDER — ZOLPIDEM TARTRATE 5 MG PO TABS: 5.0000 mg | ORAL_TABLET | Freq: Every evening | ORAL | 0 refills | Status: DC | PRN

## 2019-01-08 NOTE — Telephone Encounter (Signed)
Spoke to pt. Pt went to pharmacy to pick up Rx. Pharmacy does not have Rx. Pt called office to let us know. Noticed Rx said "print" by Dr Talbert Nan. Rx then cancelled and rewritten by Dr Sabra Heck for pt sent electronically on 01/08/19 at 1:26pm. Pt aware and will wait a call from pharmacy when ready. Pt agreeable.   Will route to Dr Sabra Heck for review and will close encounter.  CC: Dr Talbert Nan for [facility]

## 2019-01-08 NOTE — Telephone Encounter (Signed)
Patient left voicemail stating that a prescription was supposed to called into the pharmacy and the pharmacy did not have the prescription.

## 2019-01-08 NOTE — Progress Notes (Signed)
Rx for ambien 5mg  po qhs prn insomnia.  #15/0RF.  Previous order was printed and has been shredded.  This is pt of Dr. Gentry Fitz but she is not in the office today so I did the prescription electronically for pt.

## 2019-01-08 NOTE — Telephone Encounter (Signed)
Spoke to pt. Pt made aware of Rx at pharmacy and given instructions per Dr Talbert Nan. Pt agreeable. Pt states nausea is gone and thankful for Rx for Ambien to help sleep.   Will route to Dr Talbert Nan for review and will close encounter.

## 2019-01-08 NOTE — Telephone Encounter (Signed)
Please let the patient know that 10 tablets of ambien were sent to her pharmacy, she should try taking 1/2 a tablet to start with.

## 2019-01-12 ENCOUNTER — Ambulatory Visit: Admitting: Obstetrics and Gynecology

## 2019-01-12 ENCOUNTER — Other Ambulatory Visit: Payer: Self-pay

## 2019-01-12 ENCOUNTER — Encounter: Payer: Self-pay | Admitting: Obstetrics and Gynecology

## 2019-01-12 ENCOUNTER — Telehealth: Payer: Self-pay | Admitting: Obstetrics and Gynecology

## 2019-01-12 VITALS — BP 100/62 | HR 78 | Temp 97.8°F | Ht 66.0 in | Wt 162.0 lb

## 2019-01-12 DIAGNOSIS — Z90721 Acquired absence of ovaries, unilateral: Secondary | ICD-10-CM

## 2019-01-12 DIAGNOSIS — G8918 Other acute postprocedural pain: Secondary | ICD-10-CM

## 2019-01-12 NOTE — Telephone Encounter (Signed)
Patient is supposed to have pelvic floor therapy done tomorrow 01/13/19 and would like to know if this is something she should still have done.

## 2019-01-12 NOTE — Telephone Encounter (Signed)
Call returned to pt. After speaking to Dr Oscar La. Pt left detailed message for OK to have pelvic floor therapy tomorrow on 01/13/2019. Pt to call back with any questions or concerns.   Will route to Dr Oscar La for review and will close encounter.

## 2019-01-12 NOTE — Progress Notes (Signed)
GYNECOLOGY  VISIT   HPI: 29 y.o.   Married White or Caucasian Not Hispanic or Latino  female   G0P0000 with No LMP recorded. Patient has had an implant.   here 2 weeks s/p laparoscopy, LOA, left oophorectomy. She is still having post op pain. She states that her pain is constant especially laying and sitting, up to a 6/10 in severity.  She states that her nausea gone. Not sleeping well. She isn't taking narcotics, didn't really help, caused constipation. On occasion she takes Ibuprofen, helps a little. Previously her pain was around her left lower quadrant incision. Now her pain is from her left lower hip, radiating through her lower abdomen. Sneezing and coughing makes the pain worse.  Eating fine, some constipation (better), voiding fine.   GYNECOLOGIC HISTORY: No LMP recorded. Patient has had an implant. Contraception:Implant  Menopausal hormone therapy: none         OB History    Gravida  0   Para  0   Term  0   Preterm  0   AB  0   Living  0     SAB  0   TAB  0   Ectopic  0   Multiple  0   Live Births  0              Patient Active Problem List   Diagnosis Date Noted  . Migraine without aura   . Anal fissure 02/16/2016  . Rectal bleeding 02/16/2016  . Anal pain 02/16/2016    Past Medical History:  Diagnosis Date  . Anxiety   . Chronic headaches   . Depression   . GERD (gastroesophageal reflux disease)   . Migraine without aura   . Umbilical hernia     Past Surgical History:  Procedure Laterality Date  . HERNIA REPAIR     x 2  . LAPAROSCOPY N/A 12/29/2018   Procedure: LAPAROSCOPY DIAGNOSTIC;  Surgeon: Salvadore Dom, MD;  Location: [facility];  Service: Gynecology;  Laterality: N/A;  1 hour surgery time/ follow Dr Elza Rafter case  . OVARIAN CYST REMOVAL Left 12/29/2018   Procedure: LAPARASCOPIC OOPHARECTOMY;  Surgeon: Salvadore Dom, MD;  Location: [facility];  Service: Gynecology;  Laterality: Left;     Current Outpatient Medications  Medication Sig Dispense Refill  . etonogestrel (NEXPLANON) 68 MG IMPL implant 68 mg by Subdermal route once.    . Galcanezumab-gnlm (EMGALITY) 120 MG/ML SOAJ Inject into the skin every 30 (thirty) days.    Marland Kitchen ibuprofen (ADVIL) 800 MG tablet Take 1 tablet (800 mg total) by mouth every 8 (eight) hours as needed. 30 tablet 0  . lidocaine (XYLOCAINE) 5 % ointment Apply topically 20 minutes prior to intercourse, wipe off just prior to intercourse. 30 g 0  . ondansetron (ZOFRAN) 8 MG tablet Take 0.5 tablets (4 mg total) by mouth every 8 (eight) hours as needed for nausea or vomiting. 20 tablet 0  . oxycodone (OXY-IR) 5 MG capsule Take 1 capsule (5 mg total) by mouth every 6 (six) hours as needed. 10 capsule 0  . promethazine (PHENERGAN) 12.5 MG tablet 1-2 tablets po q 4-6 hours prn nausea 30 tablet 0  . Rimegepant Sulfate (NURTEC) 75 MG TBDP Take by mouth.    . senna (SENOKOT) 8.6 MG TABS tablet Take 1 tablet (8.6 mg total) by mouth at bedtime as needed for moderate constipation. 30 tablet 0  . verapamil (CALAN) 40 MG tablet Take 1 tablet  by mouth as needed.    . zolpidem (AMBIEN) 5 MG tablet Take 1 tablet (5 mg total) by mouth at bedtime as needed for sleep. 15 tablet 0   No current facility-administered medications for this visit.     ALLERGIES: Botox [botulinum toxin type a]  Family History  Problem Relation Age of Onset  . Colon cancer Maternal Grandfather        passed away 04-23-2012    Social History   Socioeconomic History  . Marital status: Married    Spouse name: Not on file  . Number of children: Not on file  . Years of education: Not on file  . Highest education level: Not on file  Occupational History  . Not on file  Tobacco Use  . Smoking status: Current Some Day Smoker    Types: Cigarettes  . Smokeless tobacco: Never Used  Substance and Sexual Activity  . Alcohol use: Yes    Comment: socially  . Drug use: Never  . Sexual activity: Yes     Birth control/protection: Implant  Other Topics Concern  . Not on file  Social History Narrative  . Not on file   Social Determinants of Health   Financial Resource Strain:   . Difficulty of Paying Living Expenses: Not on file  Food Insecurity:   . Worried About Programme researcher, broadcasting/film/video in the Last Year: Not on file  . Ran Out of Food in the Last Year: Not on file  Transportation Needs:   . Lack of Transportation (Medical): Not on file  . Lack of Transportation (Non-Medical): Not on file  Physical Activity:   . Days of Exercise per Week: Not on file  . Minutes of Exercise per Session: Not on file  Stress:   . Feeling of Stress : Not on file  Social Connections:   . Frequency of Communication with Friends and Family: Not on file  . Frequency of Social Gatherings with Friends and Family: Not on file  . Attends Religious Services: Not on file  . Active Member of Clubs or Organizations: Not on file  . Attends Banker Meetings: Not on file  . Marital Status: Not on file  Intimate Partner Violence:   . Fear of Current or Ex-Partner: Not on file  . Emotionally Abused: Not on file  . Physically Abused: Not on file  . Sexually Abused: Not on file    Review of Systems  Gastrointestinal: Positive for abdominal pain.  All other systems reviewed and are negative.   PHYSICAL EXAMINATION:    There were no vitals taken for this visit.    General appearance: alert, cooperative and appears stated age Abdomen: soft, mildly tender around her left lower quadrant incision and in her BLQ, no rebound, no guarding; non distended, no masses,  no organomegaly   ASSESSMENT 2 weeks s/p diagnostic laparoscopy, lysis of adhesions, left oophorectomy. She continues to have post op pain, exam is benign, no signs of infection.     PLAN Continue with ibuprofen/tylenol prn Call if not improving F/U for an annual exam in a few months. She is going to try going back to work today, will  call if she feels she needs more time off.    An After Visit Summary was printed and given to the patient.

## 2019-01-13 ENCOUNTER — Telehealth: Payer: Self-pay | Admitting: Obstetrics and Gynecology

## 2019-01-13 DIAGNOSIS — R112 Nausea with vomiting, unspecified: Secondary | ICD-10-CM

## 2019-01-13 DIAGNOSIS — G8918 Other acute postprocedural pain: Secondary | ICD-10-CM

## 2019-01-13 MED ORDER — PROMETHAZINE HCL 12.5 MG PO TABS: ORAL_TABLET | ORAL | 0 refills | Status: DC

## 2019-01-13 MED ORDER — IBUPROFEN 800 MG PO TABS: 800.0000 mg | ORAL_TABLET | Freq: Three times a day (TID) | ORAL | 0 refills | Status: DC | PRN

## 2019-01-13 NOTE — Telephone Encounter (Signed)
Patient is calling regarding returning to work after surgery. Patient stated that she did pelvic floor therapy this morning and she experienced excruciating pain. Patient is requesting to work every other day, and is requesting a doctor's note.

## 2019-01-13 NOTE — Telephone Encounter (Signed)
Routing to Dr Oscar La for restrictions and when to return to work. Pt requests working every other day. Pt states that she had "severe pain while doing pelvic floor therapy".   Dr Note pended.

## 2019-01-13 NOTE — Telephone Encounter (Signed)
Please give her a note for the rest of this week, working every other day.

## 2019-01-13 NOTE — Telephone Encounter (Signed)
Spoke to pt. Pt asking about letter for work. Letter reviewed and signed by Dr Oscar La. Signed letter placed at front office in envelope for pt to pick up and Mychart letter sent as well. Pt aware.  Pt asking about refill on Phenergan and Ibuprofen. Rx sent to pharmacy on file. Verbal orders per Dr Oscar La.  Will route to Dr Oscar La for review and will close encounter.

## 2019-03-08 ENCOUNTER — Emergency Department (HOSPITAL_COMMUNITY)
Admission: EM | Admit: 2019-03-08 | Discharge: 2019-03-08 | Disposition: A | Discharge status: Home / Self Care | Attending: Emergency Medicine | Admitting: Emergency Medicine

## 2019-03-08 ENCOUNTER — Other Ambulatory Visit: Payer: Self-pay

## 2019-03-08 ENCOUNTER — Encounter (HOSPITAL_COMMUNITY): Payer: Self-pay | Admitting: Emergency Medicine

## 2019-03-08 DIAGNOSIS — Z79899 Other long term (current) drug therapy: Secondary | ICD-10-CM | POA: Diagnosis not present

## 2019-03-08 DIAGNOSIS — K921 Melena: Secondary | ICD-10-CM | POA: Diagnosis present

## 2019-03-08 DIAGNOSIS — K644 Residual hemorrhoidal skin tags: Secondary | ICD-10-CM | POA: Diagnosis not present

## 2019-03-08 MED ORDER — HYDROCORTISONE (PERIANAL) 2.5 % EX CREA: 1.0000 "application " | TOPICAL_CREAM | Freq: Two times a day (BID) | CUTANEOUS | 0 refills | Status: DC

## 2019-03-08 MED ORDER — LIDOCAINE-EPINEPHRINE (PF) 2 %-1:200000 IJ SOLN: 10.0000 mL | Freq: Once | INTRAMUSCULAR | Status: DC

## 2019-03-08 MED ORDER — HYDROCORTISONE ACETATE 25 MG RE SUPP: 25.0000 mg | Freq: Two times a day (BID) | RECTAL | 0 refills | Status: DC

## 2019-03-08 MED ORDER — LIDOCAINE-EPINEPHRINE-TETRACAINE (LET) TOPICAL GEL: 3.0000 mL | Freq: Once | TOPICAL | Status: DC

## 2019-03-08 NOTE — ED Triage Notes (Signed)
Pt reports she has a very painful hemorrhoid that is large and now bleeding. She states she went to urgent care and they told her she may need to have it removed. She has done sitz baths and topical creams at home. She was prescribed an antibiotic at [facility] this morning that she does not have yet.

## 2019-03-08 NOTE — Discharge Instructions (Signed)
You can take Tylenol or Ibuprofen as directed for pain. You can alternate Tylenol and Ibuprofen every 4 hours. If you take Tylenol at 1pm, then you can take Ibuprofen at 5pm. Then you can take Tylenol again at 9pm.   Use SITz bath as directed.   Use Anusol as directed.   Follow up with referred Gen Surg.   Return to the Emergency Dept for any fever, worsening pain/redness, vomiting, or other worsening concerns.

## 2019-03-08 NOTE — ED Provider Notes (Signed)
[facility] EMERGENCY DEPARTMENT Provider Note   CSN: [id number] Arrival date & time: 03/08/19  1005     History Chief Complaint  Patient presents with  . Hemorrhoids    Charlene Griffin is a 29 y.o. female with PMH/o GERD, Depression, who presents for evaluation of hemorrhoid that has been ongoing for the last 7 days.  She states that she has been doing topical over-the-counter creams as well as sitz bath with minimal improvement.  She states she was unable to sleep last night secondary to the pain.  She went to urgent care this morning and they prescribed her an antibiotic and hydrocortisone cream.  She states that they told her they would eventually need to be removed.  Patient states she came to the emergency department to get them removed.  She states that she did have another bowel movement between urgent care and coming to the ED and states that there was some blood bright red blood noted.  She has not noted any fevers.  She has been taking Tylenol and ibuprofen.  The history is provided by the patient.       Past Medical History:  Diagnosis Date  . Anxiety   . Chronic headaches   . Depression   . GERD (gastroesophageal reflux disease)   . Migraine without aura   . Umbilical hernia     Patient Active Problem List   Diagnosis Date Noted  . Migraine without aura   . Anal fissure 02/16/2016  . Rectal bleeding 02/16/2016  . Anal pain 02/16/2016    Past Surgical History:  Procedure Laterality Date  . HERNIA REPAIR     x 2  . LAPAROSCOPY N/A 12/29/2018   Procedure: LAPAROSCOPY DIAGNOSTIC;  Surgeon: Romualdo Bolk, MD;  Location: [facility];  Service: Gynecology;  Laterality: N/A;  1 hour surgery time/ follow Dr Rica Records case  . OVARIAN CYST REMOVAL Left 12/29/2018   Procedure: LAPARASCOPIC OOPHARECTOMY;  Surgeon: Romualdo Bolk, MD;  Location: [facility];  Service: Gynecology;  Laterality: Left;     OB  History    Gravida  0   Para  0   Term  0   Preterm  0   AB  0   Living  0     SAB  0   TAB  0   Ectopic  0   Multiple  0   Live Births  0           Family History  Problem Relation Age of Onset  . Colon cancer Maternal Grandfather        passed away 22-Apr-2012    Social History   Tobacco Use  . Smoking status: Current Some Day Smoker    Types: Cigarettes  . Smokeless tobacco: Never Used  Substance Use Topics  . Alcohol use: Yes    Comment: socially  . Drug use: Never    Home Medications Prior to Admission medications   Medication Sig Start Date End Date Taking? Authorizing Provider  etonogestrel (NEXPLANON) 68 MG IMPL implant 68 mg by Subdermal route once. 09/15/18 09/14/21  [provider]  Galcanezumab-gnlm (EMGALITY) 120 MG/ML SOAJ Inject into the skin every 30 (thirty) days.    [provider]  hydrocortisone (ANUSOL-HC) 2.5 % rectal cream Place 1 application rectally 2 (two) times daily. 03/08/19   Maxwell Caul, PA-C  hydrocortisone (ANUSOL-HC) 25 MG suppository Place 1 suppository (25 mg total) rectally 2 (two) times daily.  03/08/19   Maxwell Caul, PA-C  ibuprofen (ADVIL) 800 MG tablet Take 1 tablet (800 mg total) by mouth every 8 (eight) hours as needed. 01/13/19   Romualdo Bolk, MD  lidocaine (XYLOCAINE) 5 % ointment Apply topically 20 minutes prior to intercourse, wipe off just prior to intercourse. 12/03/18   Romualdo Bolk, MD  ondansetron (ZOFRAN) 8 MG tablet Take 0.5 tablets (4 mg total) by mouth every 8 (eight) hours as needed for nausea or vomiting. 01/03/19 01/03/20  Patton Salles, MD  oxycodone (OXY-IR) 5 MG capsule Take 1 capsule (5 mg total) by mouth every 6 (six) hours as needed. 01/06/19   Romualdo Bolk, MD  promethazine (PHENERGAN) 12.5 MG tablet 1-2 tablets po q 4-6 hours prn nausea 01/13/19   Romualdo Bolk, MD  Rimegepant Sulfate (NURTEC) 75 MG TBDP Take by mouth.    [provider]  senna (SENOKOT) 8.6 MG TABS tablet Take 1 tablet (8.6 mg total) by mouth at bedtime as needed for moderate constipation. 01/03/19   Patton Salles, MD  verapamil (CALAN) 40 MG tablet Take 1 tablet by mouth as needed. 09/19/18   [provider]  zolpidem (AMBIEN) 5 MG tablet Take 1 tablet (5 mg total) by mouth at bedtime as needed for sleep. 01/08/19   Jerene Bears, MD    Allergies    Botox [botulinum toxin type a]  Review of Systems   Review of Systems  Constitutional: Negative for fever.  Gastrointestinal:       Hemorrhoid  All other systems reviewed and are negative.   Physical Exam Updated Vital Signs BP (!) 131/94   Pulse 86   Temp 98.1 F (36.7 C) (Oral)   Resp 16   Ht 5\' 6"  (1.676 m)   Wt 73.5 kg   SpO2 100%   BMI 26.15 kg/m   Physical Exam Vitals and nursing note reviewed.  Constitutional:      Appearance: She is well-developed.  HENT:     Head: Normocephalic and atraumatic.  Eyes:     General: No scleral icterus.       Right eye: No discharge.        Left eye: No discharge.     Conjunctiva/sclera: Conjunctivae normal.  Pulmonary:     Effort: Pulmonary effort is normal.  Genitourinary:      Comments: The exam was performed with a chaperone present. Normal external female genitalia. No lesions, rash, or sores.  2.5 cm partially thrombosed hemorrhoid noted to the left of the anus.  Half the area feels slightly thrombosed and the other half is soft and tender.  No surrounding warmth, erythema.  Digital rectal exam showed no evidence of internal tenderness, mass.  No evidence of blood. Skin:    General: Skin is warm and dry.  Neurological:     Mental Status: She is alert.  Psychiatric:        Speech: Speech normal.        Behavior: Behavior normal.     ED Results / Procedures / Treatments   Labs (all labs ordered are listed, but only abnormal results are displayed) Labs Reviewed - No data to  display  EKG None  Radiology No results found.  Procedures Procedures (including critical care time)  Medications Ordered in ED Medications - No data to display  ED Course  I have reviewed the triage vital signs and the nursing notes.  Pertinent labs & imaging results  that were available during my care of the patient were reviewed by me and considered in my medical decision making (see chart for details).    MDM Rules/Calculators/A&P                      29 year old female who presents for evaluation of hemorrhoid pain x1 week.  Went to urgent care today and she was told that it would need to be removed.  She was discharged home with hydrocortisone cream as well as some antibiotics.  She states she came here to have it removed.  She has not had any fevers.  She did have an episode earlier today between her bowel movements where she had some blood after having a bowel movement.  On initially arrival, she is afebrile, nontoxic-appearing.  Vital signs are stable.  On rectal exam, she has a 2.5 cm partially thrombosed hemorrhoid to the left of the anus.  Half the area appears to be thrombosed and have the area is soft, slightly tender.  No evidence of surrounding erythema, warmth.  Digital rectal exam showed no evidence of internal tenderness.  No blood noted.  History/physical exam is not concerning for perianal, perirectal abscess.  At this time, given that it is only partially thrombosed, I do not feel that it is amenable to I&D. Concern for worsening bleeding  I discussed with patient that complete removal will need to be done outpatient with GEN surge.  We will plan to give her Anusol cream and have her follow-up with GEN surge.  Encouraged at home supportive care measures. Patient reviewed on PMP.  Patient had ample opportunity for questions and discussion. All patient's questions were answered with full understanding. Strict return precautions discussed. Patient expresses understanding  and agreement to plan.    Portions of this note were generated with Lobbyist. Dictation errors may occur despite best attempts at proofreading.   Final Clinical Impression(s) / ED Diagnoses Final diagnoses:  External hemorrhoid    Rx / DC Orders ED Discharge Orders         Ordered    hydrocortisone (ANUSOL-HC) 25 MG suppository  2 times daily     03/08/19 1156    hydrocortisone (ANUSOL-HC) 2.5 % rectal cream  2 times daily     03/08/19 1156           Desma Mcgregor 03/08/19 1623    Dorie Rank, MD 03/09/19 1017

## 2019-03-16 ENCOUNTER — Encounter: Payer: Self-pay | Admitting: *Deleted

## 2019-03-16 ENCOUNTER — Ambulatory Visit: Admitting: Physician Assistant

## 2019-03-16 ENCOUNTER — Encounter: Payer: Self-pay | Admitting: Physician Assistant

## 2019-03-16 VITALS — BP 106/70 | HR 90 | Temp 98.0°F | Ht 66.0 in | Wt 164.1 lb

## 2019-03-16 DIAGNOSIS — R1032 Left lower quadrant pain: Secondary | ICD-10-CM

## 2019-03-16 DIAGNOSIS — K645 Perianal venous thrombosis: Secondary | ICD-10-CM

## 2019-03-16 NOTE — Progress Notes (Signed)
Chief Complaint: Hemorrhoids  HPI:    Charlene Griffin is a 29 year old female with a past medical history as listed below, known to [place] for previous anal fissure, assigned to Dr. Loletha Carrow at that visit, who was referred to me by Lynnell Catalan, MD for a complaint of hemorrhoids.      02/16/2016 patient saw Alonza Bogus in clinic for rectal bleeding.  Also having some pain at that time.  Family history of colon cancer in maternal grandfather.  At that time rectal exam with tender posterior anal fissure.  Treated with Nitroglycerin.    03/08/2019 seen in the ED for hemorrhoids.  She saw the urgent care initially who gave her an antibiotic and hydrocortisone cream and told her eventually that would need to be removed.  She came to the ER to have them removed.  Rectal exam revealed a 2.5 cm partially thrombosed hemorrhoid to the left of the anus.  She was told to follow-up with general surgery.    Today, the patient presents clinic and explains that for the past 17 days she has had a tender large external hemorrhoid.  She was seen in the ED and urgent care for this and "no one could help me".  Tells me that she would like this banded.  Explains that it was at first just tender with a sharp stabbing pain and now it is doing that plus itching.  Patient has been doing sitz bath multiple times a day and applying Hydrocortisone ointment as well as doing Hydrocortisone suppositories and nothing is helping.  Patient is at the end of her rope.  Bowel movements are soft solid, denies straining currently.    Also complains of a chronic left lower quadrant pain over the past 6 months, initially thought this was related to her ovary but had a left oophorectomy 3 months ago and the pain has not changed.  This is not really any different with a bowel movement.  Denies blood in her stool, fever or chills.    Denies weight loss, nausea or vomiting.  Past Medical History:  Diagnosis Date  . Anxiety   . Chronic headaches   .  Depression   . GERD (gastroesophageal reflux disease)   . Migraine without aura   . Umbilical hernia     Past Surgical History:  Procedure Laterality Date  . HERNIA REPAIR     x 2  . LAPAROSCOPY N/A 12/29/2018   Procedure: LAPAROSCOPY DIAGNOSTIC;  Surgeon: Salvadore Dom, MD;  Location: [facility];  Service: Gynecology;  Laterality: N/A;  1 hour surgery time/ follow Dr Elza Rafter case  . OVARIAN CYST REMOVAL Left 12/29/2018   Procedure: LAPARASCOPIC OOPHARECTOMY;  Surgeon: Salvadore Dom, MD;  Location: [facility];  Service: Gynecology;  Laterality: Left;    Current Outpatient Medications  Medication Sig Dispense Refill  . etonogestrel (NEXPLANON) 68 MG IMPL implant 68 mg by Subdermal route once.    . Galcanezumab-gnlm (EMGALITY) 120 MG/ML SOAJ Inject into the skin every 30 (thirty) days.    . hydrocortisone (ANUSOL-HC) 2.5 % rectal cream Place 1 application rectally 2 (two) times daily. 30 g 0  . hydrocortisone (ANUSOL-HC) 25 MG suppository Place 1 suppository (25 mg total) rectally 2 (two) times daily. 12 suppository 0  . ibuprofen (ADVIL) 800 MG tablet Take 1 tablet (800 mg total) by mouth every 8 (eight) hours as needed. 30 tablet 0  . lidocaine (XYLOCAINE) 5 % ointment Apply topically 20 minutes prior to  intercourse, wipe off just prior to intercourse. 30 g 0  . ondansetron (ZOFRAN) 8 MG tablet Take 0.5 tablets (4 mg total) by mouth every 8 (eight) hours as needed for nausea or vomiting. 20 tablet 0  . oxycodone (OXY-IR) 5 MG capsule Take 1 capsule (5 mg total) by mouth every 6 (six) hours as needed. 10 capsule 0  . promethazine (PHENERGAN) 12.5 MG tablet 1-2 tablets po q 4-6 hours prn nausea 30 tablet 0  . Rimegepant Sulfate (NURTEC) 75 MG TBDP Take by mouth.    . senna (SENOKOT) 8.6 MG TABS tablet Take 1 tablet (8.6 mg total) by mouth at bedtime as needed for moderate constipation. 30 tablet 0  . verapamil (CALAN) 40 MG tablet Take 1 tablet  by mouth as needed.    . zolpidem (AMBIEN) 5 MG tablet Take 1 tablet (5 mg total) by mouth at bedtime as needed for sleep. 15 tablet 0   No current facility-administered medications for this visit.    Allergies as of 03/16/2019 - Review Complete 03/08/2019  Allergen Reaction Noted  . Botox [botulinum toxin type a] Rash and Other (See Comments) 02/14/2016    Family History  Problem Relation Age of Onset  . Colon cancer Maternal Grandfather        passed away 05-23-12    Social History   Socioeconomic History  . Marital status: Married    Spouse name: Not on file  . Number of children: Not on file  . Years of education: Not on file  . Highest education level: Not on file  Occupational History  . Not on file  Tobacco Use  . Smoking status: Current Some Day Smoker    Types: Cigarettes  . Smokeless tobacco: Never Used  Substance and Sexual Activity  . Alcohol use: Yes    Comment: socially  . Drug use: Never  . Sexual activity: Yes    Birth control/protection: Implant  Other Topics Concern  . Not on file  Social History Narrative  . Not on file   Social Determinants of Health   Financial Resource Strain:   . Difficulty of Paying Living Expenses: Not on file  Food Insecurity:   . Worried About Programme researcher, broadcasting/film/video in the Last Year: Not on file  . Ran Out of Food in the Last Year: Not on file  Transportation Needs:   . Lack of Transportation (Medical): Not on file  . Lack of Transportation (Non-Medical): Not on file  Physical Activity:   . Days of Exercise per Week: Not on file  . Minutes of Exercise per Session: Not on file  Stress:   . Feeling of Stress : Not on file  Social Connections:   . Frequency of Communication with Friends and Family: Not on file  . Frequency of Social Gatherings with Friends and Family: Not on file  . Attends Religious Services: Not on file  . Active Member of Clubs or Organizations: Not on file  . Attends Banker Meetings: Not  on file  . Marital Status: Not on file  Intimate Partner Violence:   . Fear of Current or Ex-Partner: Not on file  . Emotionally Abused: Not on file  . Physically Abused: Not on file  . Sexually Abused: Not on file    Review of Systems:    Constitutional: No weight loss, fever or chills Skin: No rash  Cardiovascular: No chest pain Respiratory: No SOB  Gastrointestinal: See HPI and otherwise negative Genitourinary: No dysuria  Neurological: No headache Musculoskeletal: No new muscle or joint pain Hematologic: No bleeding  Psychiatric: No history of depression or anxiety   Physical Exam:  Vital signs: BP 106/70 (BP Location: Left Arm, Patient Position: Sitting, Cuff Size: Normal)   Pulse 90   Temp 98 F (36.7 C)   Ht 5\' 6"  (1.676 m) Comment: height measured without shoes  Wt 164 lb 2 oz (74.4 kg)   BMI 26.49 kg/m   Constitutional:   Pleasant Caucasian female appears to be in NAD, Well developed, Well nourished, alert and cooperative Head:  Normocephalic and atraumatic. Eyes:   PEERL, EOMI. No icterus. Conjunctiva pink. Ears:  Normal auditory acuity. Neck:  Supple Throat: Oral cavity and pharynx without inflammation, swelling or lesion.  Respiratory: Respirations even and unlabored. Lungs clear to auscultation bilaterally.   No wheezes, crackles, or rhonchi.  Cardiovascular: Normal S1, S2. No MRG. Regular rate and rhythm. No peripheral edema, cyanosis or pallor.  Gastrointestinal:  Soft, nondistended, moderate left lower quadrant TTP to deep palpation. No rebound or guarding. Normal bowel sounds. No appreciable masses or hepatomegaly. Rectal: External: Nickel sized thrombosed external hemorrhoid, tender to palpation Msk:  Symmetrical without gross deformities. Without edema, no deformity or joint abnormality.  Neurologic:  Alert and  oriented x4;  grossly normal neurologically.  Skin:   Dry and intact without significant lesions or rashes. Psychiatric:  Demonstrates good  judgement and reason without abnormal affect or behaviors.  See HPI for recent labs and imaging.  Assessment: 1.  Thrombosed external hemorrhoid: This has been going on for 17 days with only minimal decrease in size per the patient after using Hydrocortisone suppositories and doing sitz bath's, at this point even though she is past the regular 48-hour window would recommend that she have an I&D 2.  Rectal pain: With above 3.  Chronic left lower quadrant pain: For the past 6 months, no change after left oophorectomy and no change with regular bowel movements; consider diverticulosis versus other  Plan: 1.  Referred patient to [facility], they were able to get her in on 03/18/2019 for possible I&D. 2.  Patient can continue Hydrocortisone cream and sitz bath's in the interim 3.  Discussed that banding was only for internal hemorrhoids, we cannot band this one. 4.  Ordered a CT abdomen pelvis for further evaluation of this chronic left lower quadrant pain 5.  Patient to follow in clinic with 05/18/2019 per recommendations after imaging above.  [place], PA-C [facility] 03/16/2019, 1:27 PM  Cc: 05/16/2019, MD

## 2019-03-16 NOTE — Progress Notes (Signed)
____________________________________________________________  Attending physician addendum:  Thank you for sending this case to me. I have reviewed the entire note, and the outlined plan seems appropriate.  Torsten Weniger Danis, MD  ____________________________________________________________  

## 2019-03-16 NOTE — Patient Instructions (Addendum)
If you are age 29 or older, your body mass index should be between 23-30. Your Body mass index is 26.49 kg/m. If this is out of the aforementioned range listed, please consider follow up with your Primary Care Provider.  If you are age 3 or younger, your body mass index should be between 19-25. Your Body mass index is 26.49 kg/m. If this is out of the aformentioned range listed, please consider follow up with your Primary Care Provider.   Referral placed to [facility]. Appointment scheduled for Wednesday 03/18/19 @ 8:30 am. Please arrive 15 minutes early.    You have been scheduled for a CT scan of the abdomen and pelvis at [facility] ([address]---this is in the same building as [employer]).    You should arrive 15 minutes prior to your appointment time for registration. Please follow the written instructions below on the day of your exam:  WARNING: IF YOU ARE ALLERGIC TO IODINE/X-RAY DYE, PLEASE NOTIFY RADIOLOGY IMMEDIATELY AT [phone]! YOU WILL BE GIVEN A 13 HOUR PREMEDICATION PREP.  1) Do not eat or drink anything after 4 hours prior to your test. 2) You have been given 2 bottles of oral contrast to drink. The solution may taste better if refrigerated, but do NOT add ice or any other liquid to this solution. Shake well before drinking.    Drink 1 bottle of contrast 2 hours prior to your exam  Drink 1 bottle of contrast 1 hour prior to your exam  You may take any medications as prescribed with a small amount of water, if necessary. If you take any of the following medications: METFORMIN, GLUCOPHAGE, GLUCOVANCE, AVANDAMET, RIOMET, FORTAMET, Sunol MET, JANUMET, GLUMETZA or METAGLIP, you MAY be asked to HOLD this medication 48 hours AFTER the exam.  The purpose of you drinking the oral contrast is to aid in the visualization of your intestinal tract. The contrast solution may cause some diarrhea. Depending on your individual set of symptoms,  you may also receive an intravenous injection of x-ray contrast/dye. Plan on being at [facility] for 30 minutes or longer, depending on the type of exam you are having performed.  This test typically takes 30-45 minutes to complete.  If you have any questions regarding your exam or if you need to reschedule, you may call the CT department at [phone] between the hours of 8:00 am and 5:00 pm, Monday-Friday.  ___________________________________________________________

## 2019-03-17 ENCOUNTER — Telehealth: Payer: Self-pay | Admitting: Physician Assistant

## 2019-03-17 NOTE — Telephone Encounter (Signed)
Called [employer] at [phone] and spoke to [place]. We did an urgent "medically necessary" referral over the phone for I & D of a Thrombosed Hemorrhoid to [facility]. It got approved and he is faxing the approval to [facility]. Called patient and let her know

## 2019-03-17 NOTE — Telephone Encounter (Signed)
Pt is scheduled for an appt 03/18/19 at [facility].  She stated that [employer] requires an authorisation stating that "referral is a medical necessity" due to [facility] being out of network.   [employer]'s number os [phone].

## 2019-03-18 ENCOUNTER — Telehealth: Payer: Self-pay | Admitting: Physician Assistant

## 2019-03-18 NOTE — Telephone Encounter (Signed)
Patient calling stating that the [employer] that you got approved yesterday for the [facility] referral- the procedure is covered but office visits are not. She is asking if you can call and add in office visits to be covered. States she has a consultation with them this afternoon.

## 2019-03-18 NOTE — Telephone Encounter (Signed)
Called [employer] at [phone] and spoke to [place]. She added 2 office visits to patient's approval for referral to [facility]. Authorization #  [id number]. Will call patient and notify.

## 2019-03-18 NOTE — Telephone Encounter (Signed)
Called Charlene Griffin and sent Staff message to try to get help with this pre-approval for today.

## 2019-03-27 ENCOUNTER — Telehealth: Payer: Self-pay | Admitting: Physician Assistant

## 2019-03-27 ENCOUNTER — Other Ambulatory Visit: Payer: Self-pay

## 2019-03-27 ENCOUNTER — Ambulatory Visit (INDEPENDENT_AMBULATORY_CARE_PROVIDER_SITE_OTHER)
Admission: RE | Admit: 2019-03-27 | Discharge: 2019-03-27 | Disposition: A | Discharge status: Home / Self Care | Source: Ambulatory Visit | Attending: Physician Assistant | Admitting: Physician Assistant

## 2019-03-27 DIAGNOSIS — R1032 Left lower quadrant pain: Secondary | ICD-10-CM

## 2019-03-27 MED ORDER — IOHEXOL 300 MG/ML  SOLN
100.0000 mL | Freq: Once | INTRAMUSCULAR | Status: AC | PRN
  Administered 2019-03-27: 100 mL via INTRAVENOUS

## 2019-03-27 NOTE — Telephone Encounter (Signed)
Pt requested a call back to discuss CT results.  °

## 2019-03-30 NOTE — Telephone Encounter (Signed)
Spoke to patient. See CT result note

## 2019-04-21 NOTE — Progress Notes (Signed)
28 y.o. G0P0000 Married White or Caucasian Not Hispanic or Latino female here for annual exam. Patient complains of having left lower abdominal pain. It is the same pain she had prior to her left oophorectomy.   The pain feels like menstrual cramping. Can occur occasionally throughout the month, a few days a month it is very intense off and on for 2-3 days. She hasn't calendared it. Occasionally associated with bleeding, but mostly note. Pain has been present for over a year.  BM 1 x a day, not a consistent texture, can be hard or diarrhea. She has seen a GI, didn't really address pain. Just had a negative CT scan (other than hernia, liver cyst, diverticulum), she did have a large stool burden and had just had a large BM.  Had bleeding from Saturday 04/18/19 until Monday 04/20/19, moderate flow. Typically she has random spotting. Sex is still painful, she is still pelvic floor PT. She is having sex, prevents deep penetration. There is one spot he hits and she gets stabbing pain. Not quite as bad as it used to be.   She has a nexplanon, placed in 9/20. She gets occasional pain in her arm. Prior IUD was malpositioned.   H/O primary oligomenorrhea, mildly elevated testosterone.   She underwent laparoscopy and left oophorectomy in 12/20 for a dermoid cyst (unable to resect from the ovarian tissue).    Patient's last menstrual period was 04/18/2019.          Sexually active: Yes.    The current method of family planning is Nexplanon.    Exercising: Yes.    walking daily Smoker:  yes  Health Maintenance: Pap:  03/18/18 normal  History of abnormal Pap:  no TDaP:  2018 Gardasil: unsure   reports that she has been smoking e-cigarettes. She has never used smokeless tobacco. She reports current alcohol use. She reports that she does not use drugs. 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 65 year old step daughter, her husband has had sole custody but currently in a custody battle with  the girls mother.   Past Medical History:  Diagnosis Date  . Anxiety   . Chronic headaches   . Depression   . GERD (gastroesophageal reflux disease)   . Migraine without aura   . Umbilical hernia     Past Surgical History:  Procedure Laterality Date  . HERNIA REPAIR     x 2  . LAPAROSCOPY N/A 12/29/2018   Procedure: LAPAROSCOPY DIAGNOSTIC;  Surgeon: Romualdo Bolk, MD;  Location: [facility];  Service: Gynecology;  Laterality: N/A;  1 hour surgery time/ follow Dr Rica Records case  . OVARIAN CYST REMOVAL Left 12/29/2018   Procedure: LAPARASCOPIC OOPHARECTOMY;  Surgeon: Romualdo Bolk, MD;  Location: [facility];  Service: Gynecology;  Laterality: Left;    Current Outpatient Medications  Medication Sig Dispense Refill  . albuterol (VENTOLIN HFA) 108 (90 Base) MCG/ACT inhaler Inhale 1 puff into the lungs every 6 (six) hours as needed.    . etonogestrel (NEXPLANON) 68 MG IMPL implant 68 mg by Subdermal route once.    . Galcanezumab-gnlm (EMGALITY) 120 MG/ML SOAJ Inject into the skin every 30 (thirty) days.    Marland Kitchen ibuprofen (ADVIL) 800 MG tablet Take 1 tablet (800 mg total) by mouth every 8 (eight) hours as needed. 30 tablet 0  . lidocaine (XYLOCAINE) 5 % ointment Apply topically 20 minutes prior to intercourse, wipe off just prior to intercourse. 30 g  0  . Rimegepant Sulfate (NURTEC) 75 MG TBDP Take by mouth.    . verapamil (CALAN) 40 MG tablet Take 1 tablet by mouth 2 (two) times daily.      No current facility-administered medications for this visit.    Family History  Problem Relation Age of Onset  . Colon cancer Maternal Grandfather        passed away 05-06-2012    Review of Systems  Constitutional: Negative.   HENT: Negative.   Eyes: Negative.   Respiratory: Negative.   Cardiovascular: Negative.   Gastrointestinal:       Left Lower Abdominal pain  Endocrine: Negative.   Genitourinary: Negative.   Musculoskeletal: Negative.   Skin:  Negative.   Allergic/Immunologic: Negative.   Neurological: Negative.   Hematological: Negative.   Psychiatric/Behavioral: Negative.     Exam:   BP 102/60 (BP Location: Right Arm, Patient Position: Sitting, Cuff Size: Normal)   Pulse 76   Temp (!) 97 F (36.1 C) (Temporal)   Resp 10   Ht 5\' 7"  (1.702 m)   Wt 162 lb (73.5 kg)   LMP 04/18/2019   BMI 25.37 kg/m   Weight change: @WEIGHTCHANGE @ Height:   Height: 5\' 7"  (170.2 cm)  Ht Readings from Last 3 Encounters:  04/23/19 5\' 7"  (1.702 m)  03/16/19 5\' 6"  (1.676 m)  03/08/19 5\' 6"  (1.676 m)    General appearance: alert, cooperative and appears stated age Head: Normocephalic, without obvious abnormality, atraumatic Neck: no adenopathy, supple, symmetrical, trachea midline and thyroid normal to inspection and palpation Lungs: clear to auscultation bilaterally Cardiovascular: regular rate and rhythm Breasts: normal appearance, no masses or tenderness Abdomen: soft, non-tender; non distended,  no masses,  no organomegaly Extremities: extremities normal, atraumatic, no cyanosis or edema Skin: Skin color, texture, turgor normal. No rashes or lesions Lymph nodes: Cervical, supraclavicular, and axillary nodes normal. No abnormal inguinal nodes palpated Neurologic: Grossly normal   Pelvic: External genitalia:  no lesions              Urethra:  normal appearing urethra with no masses, tenderness or lesions              Bartholins and Skenes: normal                 Vagina: normal appearing vagina with normal color and discharge, no lesions              Cervix: no lesions and mild cervical motion tenderness.               Bimanual Exam:  Uterus:  anteverted, mobile, tender, normal sized.               Adnexa: no masses, bilateral tenderness               Rectovaginal: Confirms               Anus:  normal sphincter tone, no lesions  Rectum: tender with vaginal palpation of her rectum  Pelvic floor: not tender  Terence Lux  chaperoned for the exam.  A:  Well Woman with normal exam  Contraception, doesn't love the nexplanon, considering another IUD (last one was malpositioned). If she gets an IUD would place under ultrasound guidance. Given her tenderness, I'm not sure an IUD is her best option  Vit d def in diet  LLQ abdominal pain, suspect from bowel etiology, goes from constipation to diarrhea  Continued pelvic tenderness, no findings on laparoscopy (other than  left dermoid, s/p oophorectomy). Previously treated for PID without help.  Dyspareunia, some help with PT.   Contraception, nexplanon.     P:   No pap this year  Lipid panel (other screening labs up to date)  Vit D level  Discussed breast self exam  Discussed calcium and vit D intake  She will f/u with GI

## 2019-04-22 ENCOUNTER — Other Ambulatory Visit: Payer: Self-pay

## 2019-04-23 ENCOUNTER — Encounter: Payer: Self-pay | Admitting: Obstetrics and Gynecology

## 2019-04-23 ENCOUNTER — Ambulatory Visit: Admitting: Obstetrics and Gynecology

## 2019-04-23 ENCOUNTER — Telehealth: Payer: Self-pay | Admitting: Obstetrics and Gynecology

## 2019-04-23 VITALS — BP 102/60 | HR 76 | Temp 97.0°F | Resp 10 | Ht 67.0 in | Wt 162.0 lb

## 2019-04-23 DIAGNOSIS — Z01419 Encounter for gynecological examination (general) (routine) without abnormal findings: Secondary | ICD-10-CM

## 2019-04-23 DIAGNOSIS — Z90721 Acquired absence of ovaries, unilateral: Secondary | ICD-10-CM

## 2019-04-23 DIAGNOSIS — N9412 Deep dyspareunia: Secondary | ICD-10-CM

## 2019-04-23 DIAGNOSIS — E559 Vitamin D deficiency, unspecified: Secondary | ICD-10-CM | POA: Diagnosis not present

## 2019-04-23 DIAGNOSIS — Z0000 Encounter for general adult medical examination without abnormal findings: Secondary | ICD-10-CM | POA: Diagnosis not present

## 2019-04-23 DIAGNOSIS — R102 Pelvic and perineal pain: Secondary | ICD-10-CM

## 2019-04-23 DIAGNOSIS — R1032 Left lower quadrant pain: Secondary | ICD-10-CM

## 2019-04-23 NOTE — Telephone Encounter (Signed)
The patient had a normal pap in 3/20 (reviewed in care everywhere). She has had negative vaginitis testing, negative STD testing, an essentially negative ultrasound and laparoscopy (other than the dermoid, which has been removed). I don't know why she is so tender. I wish I did.  This isn't c/w cervical cancer. It isn't likely from hormonal changes either. If she was having pain or tenderness one time a month, that could be from ovulation or her menses. But that doesn't seem to be the case. There is a wide range of normal hormone levels and I'm sure she will fall in that range.  We could try to suppress her cycles by putting her on OCP's, but typically that just helps cyclic pain and endometriosis, which she doesn't have. We could try short term turning off her hormones (ie with lupron). I think her bowels play a part in her pain, but that doesn't explain her pelvic tenderness.  I would recommend she calendar pain, bleeding, bowels. We can try the pill if she would like to (confirm no auras with her migraines and no FH of clotting disorders), or try lupron or orilissa to see if that helps.

## 2019-04-23 NOTE — Patient Instructions (Addendum)
EXERCISE AND DIET:  We recommended that you start or continue a regular exercise program for good health. Regular exercise means any activity that makes your heart beat faster and makes you sweat.  We recommend exercising at least 30 minutes per day at least 3 days a week, preferably 4 or 5.  We also recommend a diet low in fat and sugar.  Inactivity, poor dietary choices and obesity can cause diabetes, heart attack, stroke, and kidney damage, among others.    ALCOHOL AND SMOKING:  Women should limit their alcohol intake to no more than 7 drinks/beers/glasses of wine (combined, not each!) per week. Moderation of alcohol intake to this level decreases your risk of breast cancer and liver damage. And of course, no recreational drugs are part of a healthy lifestyle.  And absolutely no smoking or even second hand smoke. Most people know smoking can cause heart and lung diseases, but did you know it also contributes to weakening of your bones? Aging of your skin?  Yellowing of your teeth and nails?  CALCIUM AND VITAMIN D:  Adequate intake of calcium and Vitamin D are recommended.  The recommendations for exact amounts of these supplements seem to change often, but generally speaking 1,000 mg of calcium (between diet and supplement) and 800 units of Vitamin D per day seems prudent. Certain women may benefit from higher intake of Vitamin D.  If you are among these women, your doctor will have told you during your visit.    PAP SMEARS:  Pap smears, to check for cervical cancer or precancers,  have traditionally been done yearly, although recent scientific advances have shown that most women can have pap smears less often.  However, every woman still should have a physical exam from her gynecologist every year. It will include a breast check, inspection of the vulva and vagina to check for abnormal growths or skin changes, a visual exam of the cervix, and then an exam to evaluate the size and shape of the uterus and  ovaries.  And after 29 years of age, a rectal exam is indicated to check for rectal cancers. We will also provide age appropriate advice regarding health maintenance, like when you should have certain vaccines, screening for sexually transmitted diseases, bone density testing, colonoscopy, mammograms, etc.   MAMMOGRAMS:  All women over 40 years old should have a yearly mammogram. Many facilities now offer a "3D" mammogram, which may cost around $50 extra out of pocket. If possible,  we recommend you accept the option to have the 3D mammogram performed.  It both reduces the number of women who will be called back for extra views which then turn out to be normal, and it is better than the routine mammogram at detecting truly abnormal areas.    COLON CANCER SCREENING: Now recommend starting at age 45. At this time colonoscopy is not covered for routine screening until 50. There are take home tests that can be done between 45-49.   COLONOSCOPY:  Colonoscopy to screen for colon cancer is recommended for all women at age 50.  We know, you hate the idea of the prep.  We agree, BUT, having colon cancer and not knowing it is worse!!  Colon cancer so often starts as a polyp that can be seen and removed at colonscopy, which can quite literally save your life!  And if your first colonoscopy is normal and you have no family history of colon cancer, most women don't have to have it again for   10 years.  Once every ten years, you can do something that may end up saving your life, right?  We will be happy to help you get it scheduled when you are ready.  Be sure to check your insurance coverage so you understand how much it will cost.  It may be covered as a preventative service at no cost, but you should check your particular policy.      Breast Self-Awareness Breast self-awareness means being familiar with how your breasts look and feel. It involves checking your breasts regularly and reporting any changes to your  health care provider. Practicing breast self-awareness is important. A change in your breasts can be a sign of a serious medical problem. Being familiar with how your breasts look and feel allows you to find any problems early, when treatment is more likely to be successful. All women should practice breast self-awareness, including women who have had breast implants. How to do a breast self-exam One way to learn what is normal for your breasts and whether your breasts are changing is to do a breast self-exam. To do a breast self-exam: Look for Changes  1. Remove all the clothing above your waist. 2. Stand in front of a mirror in a room with good lighting. 3. Put your hands on your hips. 4. Push your hands firmly downward. 5. Compare your breasts in the mirror. Look for differences between them (asymmetry), such as: ? Differences in shape. ? Differences in size. ? Puckers, dips, and bumps in one breast and not the other. 6. Look at each breast for changes in your skin, such as: ? Redness. ? Scaly areas. 7. Look for changes in your nipples, such as: ? Discharge. ? Bleeding. ? Dimpling. ? Redness. ? A change in position. Feel for Changes Carefully feel your breasts for lumps and changes. It is best to do this while lying on your back on the floor and again while sitting or standing in the shower or tub with soapy water on your skin. Feel each breast in the following way:  Place the arm on the side of the breast you are examining above your head.  Feel your breast with the other hand.  Start in the nipple area and make  inch (2 cm) overlapping circles to feel your breast. Use the pads of your three middle fingers to do this. Apply light pressure, then medium pressure, then firm pressure. The light pressure will allow you to feel the tissue closest to the skin. The medium pressure will allow you to feel the tissue that is a little deeper. The firm pressure will allow you to feel the tissue  close to the ribs.  Continue the overlapping circles, moving downward over the breast until you feel your ribs below your breast.  Move one finger-width toward the center of the body. Continue to use the  inch (2 cm) overlapping circles to feel your breast as you move slowly up toward your collarbone.  Continue the up and down exam using all three pressures until you reach your armpit.  Write Down What You Find  Write down what is normal for each breast and any changes that you find. Keep a written record with breast changes or normal findings for each breast. By writing this information down, you do not need to depend only on memory for size, tenderness, or location. Write down where you are in your menstrual cycle, if you are still menstruating. If you are having trouble noticing differences   in your breasts, do not get discouraged. With time you will become more familiar with the variations in your breasts and more comfortable with the exam. How often should I examine my breasts? Examine your breasts every month. If you are breastfeeding, the best time to examine your breasts is after a feeding or after using a breast pump. If you menstruate, the best time to examine your breasts is 5-7 days after your period is over. During your period, your breasts are lumpier, and it may be more difficult to notice changes. When should I see my health care provider? See your health care provider if you notice:  A change in shape or size of your breasts or nipples.  A change in the skin of your breast or nipples, such as a reddened or scaly area.  Unusual discharge from your nipples.  A lump or thick area that was not there before.  Pain in your breasts.  Anything that concerns you.  Try metamucil.

## 2019-04-23 NOTE — Telephone Encounter (Signed)
Spoke with patient. Had AEX 04/23/19 with JJ.  Pt requesting hormone levels be tested due to continued pelvic pain. Pt asking if this could be the source of pain. Pt asking about potential for cervical cancer. Pap in 2020 was negative per PCP. Advised pt next pap not due until 2023.  Will review with Dr Oscar La for potential labs and return call to pt. Pt agreeable.   Routing to Dr Oscar La

## 2019-04-23 NOTE — Telephone Encounter (Signed)
Charlene Griffin [facility]  Phone Number: [phone]  Good morning I was wondering have we done a blood draw for hormone levels, and would there be a possibility of cervical cancer. Im trying to figure out what could be causing this pain and how to get rid of it.

## 2019-04-24 LAB — VITAMIN D 25 HYDROXY (VIT D DEFICIENCY, FRACTURES): Vit D, 25-Hydroxy: 25.7 ng/mL — ABNORMAL LOW (ref 30.0–100.0)

## 2019-04-24 LAB — LIPID PANEL
Chol/HDL Ratio: 3.8 ratio (ref 0.0–4.4)
Cholesterol, Total: 163 mg/dL (ref 100–199)
HDL: 43 mg/dL (ref >39)
LDL Chol Calc (NIH): 106 mg/dL — ABNORMAL HIGH (ref 0–99)
Triglycerides: 72 mg/dL (ref 0–149)
VLDL Cholesterol Cal: 14 mg/dL (ref 5–40)

## 2019-04-24 NOTE — Telephone Encounter (Signed)
Spoke with pt. Pt given recommendations per Dr Oscar La as seen below.    Pt states has Nexplanon that was inserted x 1 year ago after having IUD removed that was misplaced. Can she use with OCPs? Lupron? Pollie Friar?   Pt also has FH with clotting disorder- sister Factor V Leiden- dx at age 29 after using OCPs and had PE.   Migraines with intermittent auras, but states " very rarely " that they occur.   Will update Dr Oscar La and return call to pt with recommendations. Pt agreeable.   Routing to Dr Oscar La.

## 2019-04-25 ENCOUNTER — Encounter: Payer: Self-pay | Admitting: Obstetrics and Gynecology

## 2019-04-25 NOTE — Telephone Encounter (Signed)
She can not use combination OCP's with any h/o auras. I have updated her chart to reflect that she does have aura's.  She can keep the nexplanon in while she is on Lupron or Orlissa. My recommendation is that she tries one of these medications to see if it makes a difference in her pain.  She should still calendar her LLQ pain, bowel movements, change in diet.

## 2019-04-27 NOTE — Telephone Encounter (Signed)
Left message for pt to return call to triage RN. 

## 2019-04-27 NOTE — Telephone Encounter (Signed)
Spoke with Charlene Griffin. Charlene Griffin given recommendations per Dr Oscar La.   Charlene Griffin states willing to try either Orlissa or Lupron. Charlene Griffin requests to come and discuss options for what's best for her and her pelvic pain sx. Advised either OV or Mychart visit.  Charlene Griffin agreeable to OV. OV scheduled with Dr Oscar La on 05/06/19 at 8 am per Charlene Griffin's request of date and time. Charlene Griffin verbalized understanding.  Charlene Griffin states keeping diary of bowel movements and LLQ pain through app and will show and discuss with Dr Oscar La at [facility].  Routing to Dr Oscar La  Encounter closed.

## 2019-05-06 ENCOUNTER — Ambulatory Visit (INDEPENDENT_AMBULATORY_CARE_PROVIDER_SITE_OTHER): Admitting: Obstetrics and Gynecology

## 2019-05-06 ENCOUNTER — Encounter: Payer: Self-pay | Admitting: Obstetrics and Gynecology

## 2019-05-06 ENCOUNTER — Other Ambulatory Visit: Payer: Self-pay

## 2019-05-06 VITALS — BP 118/62 | HR 75 | Temp 97.2°F | Ht 66.0 in | Wt 163.0 lb

## 2019-05-06 DIAGNOSIS — R1032 Left lower quadrant pain: Secondary | ICD-10-CM | POA: Diagnosis not present

## 2019-05-06 DIAGNOSIS — R102 Pelvic and perineal pain: Secondary | ICD-10-CM

## 2019-05-06 DIAGNOSIS — N941 Unspecified dyspareunia: Secondary | ICD-10-CM | POA: Diagnosis not present

## 2019-05-06 NOTE — Patient Instructions (Signed)
Elagolix tablets What is this medicine? ELAGOLIX (el a GOE lix) is used to treat endometriosis in women. It reduces pain from the condition and may help reduce painful sexual intercourse. This medicine may be used for other purposes; ask your health care provider or pharmacist if you have questions. COMMON BRAND NAME(S): Orilissa What should I tell my health care provider before I take this medicine? They need to know if you have any of these conditions:  liver disease  mental illness  osteoporosis  suicidal thoughts, plans, or attempt; a previous suicide attempt by you or a family member  an unusual or allergic reaction to elagolix, other medicines, foods, dyes, or preservatives  pregnant or trying to get pregnant  breast-feeding How should I use this medicine? Take this medicine by mouth with a glass of water. You can take it with or without food. Follow the directions on the prescription label. Take this medicine at the same time each day. Do not take your medicine more often than directed. A special MedGuide will be given to you by the pharmacist with each prescription and refill. Be sure to read this information carefully each time. Talk to your pediatrician regarding the use of this medicine in children. This medicine is not approved for use in children. Overdosage: If you think you have taken too much of this medicine contact a poison control center or emergency room at once. NOTE: This medicine is only for you. Do not share this medicine with others. What if I miss a dose? If you miss a dose, take it as soon as you can. If it is almost time for your next dose, take only that dose. Do not take double or extra doses. What may interact with this medicine? Do not take this medicine with any of the following medications:  cyclosporine  gemfibrozil This medicine may also interact with the following medications:  certain antiviral medicines for hepatitis, HIV or  AIDS  citalopram  digoxin  female hormones, like estrogens or progestins and birth control pills, patches, rings, or injections  methadone  midazolam  omeprazole  rifampin  rosuvastatin This list may not describe all possible interactions. Give your health care provider a list of all the medicines, herbs, non-prescription drugs, or dietary supplements you use. Also tell them if you smoke, drink alcohol, or use illegal drugs. Some items may interact with your medicine. What should I watch for while using this medicine? Visit your doctor or health care professional for regular checks on your progress. This medicine may cause weak bones (osteoporosis). Only use this product for the amount of time your health care professional tells you to. The longer you use this product the more likely you will be at risk for weak bones. Ask your health care professional how you can keep strong bones. You may have a change in bleeding pattern or irregular periods. Many females stop having periods while taking this drug. This medicine does not prevent pregnancy. Women must use effective birth control with this medicine. Use a non-hormonal form of birth control while taking this medicine and for 1 week after stopping it. Talk to your health care professional about how to prevent pregnancy. Do not become pregnant while taking this medicine. Women should inform their doctor if they wish to become pregnant or think they might be pregnant. There is a potential for serious side effects to an unborn child. Talk to your health care professional or pharmacist for more information. Patients and their families should watch   out for new or worsening depression or thoughts of suicide. Also watch out for sudden changes in feelings such as feeling anxious, agitated, panicky, irritable, hostile, aggressive, impulsive, severely restless, overly excited and hyperactive, or not being able to sleep. If this happens, call your health  care professional. What side effects may I notice from receiving this medicine? Side effects that you should report to your doctor or health care professional as soon as possible:  allergic reactions like skin rash, itching or hives, swelling of the face, lips, or tongue  anxious  depressed mood  signs and symptoms of liver injury like dark yellow or brown urine; general ill feeling or flu-like symptoms; light-colored stools; loss of appetite; nausea; right upper belly pain; unusually weak or tired; yellowing of the eyes or skin  suicidal thoughts or other mood changes Side effects that usually do not require medical attention (report these to your doctor or health care professional if they continue or are bothersome):  reduced or absent menstrual periods  headache  hot flashes or night sweats  nausea  joint pain  trouble sleeping This list may not describe all possible side effects. Call your doctor for medical advice about side effects. You may report side effects to FDA at 1-800-FDA-1088. Where should I keep my medicine? Keep out of the reach of children. Store at room temperature between 2 and 30 degrees C (36 and 86 degrees F). Throw away any unused medicine after the expiration date on the label. Discard any unused medicine and used packaging carefully. Follow the directions in the MedGuide. Do NOT flush down the toilet. NOTE: This sheet is a summary. It may not cover all possible information. If you have questions about this medicine, talk to your doctor, pharmacist, or health care provider.  2020 Elsevier/Gold Standard (2017-09-10 12:46:01)  

## 2019-05-06 NOTE — Progress Notes (Signed)
GYNECOLOGY  VISIT   HPI: 29 y.o.   Married White or Caucasian Not Hispanic or Latino  female   [id number] with Patient's last menstrual period was 04/18/2019.   here to discuss options or Orlissa or Lupron to help with pelvic pain. Also would like to discuss vitamin D deficiency. Patient states that she has a second opinion appointment next week.  The patient has chronic pelvic pain, can occur throughout the month, a few days a month it is very intense. Occasionally associated with bleeding, mostly not. BM daily, hard to diarrhea.  She has been tracking her BM's for the last 2 weeks, average BM every 18 hours. She can pass one small pebble, mainly passes pebbles, some blobs. In the last 2 weeks she has had only 2 really normal BM's, one really soft, mostly hard and not a lot. She has seen a GI MD previously for hemorrhoids, she had a CT which showed a large stool burden, few diverticula, small hernia and liver cysts.   H/O deep dyspareunia, since she became sexually active. This is the worst part of the pain for her. Very tender on pelvic exam.  Has a nexplanon (9/20), prior IUD was malpositioned. No regular cycles, last bleeding 4/10-4/12 with moderate flow (the most she has bleed in years). She has random cramping.   Laparoscopy, left oophorectomy in 12/20 for a dermoid that couldn't be separated from her ovary. No other abnormalities noted  She was diffusely tender on pelvic exam earlier this month, but no pelvic floor tenderness. No findings of endometriosis on prior laparoscopy. Previously treated for PID without help (negative cultures). She previously had pelvic floor tenderness, helped with PT. She has some entry pain, uses lidocaine and dilators which helps. Definitely had improvement with PT.   GYNECOLOGIC HISTORY: Patient's last menstrual period was 04/18/2019. Contraception: Nexplanon  Menopausal hormone therapy: none         OB History    Gravida  0   Para  0   Term  0    Preterm  0   AB  0   Living  0     SAB  0   TAB  0   Ectopic  0   Multiple  0   Live Births  0              Patient Active Problem List   Diagnosis Date Noted  . Migraine with aura   . Non-seasonal allergic rhinitis 04/04/2018  . Tinnitus of both ears 04/04/2018  . Alopecia areata 02/08/2017  . Enlarged thyroid gland 02/08/2017  . Anxiety 12/11/2015  . Attention deficit hyperactivity disorder (ADHD) 12/11/2015    Past Medical History:  Diagnosis Date  . Anxiety   . Chronic headaches   . Depression   . GERD (gastroesophageal reflux disease)   . Migraine with aura   . Migraine without aura   . Umbilical hernia     Past Surgical History:  Procedure Laterality Date  . HERNIA REPAIR     x 2  . LAPAROSCOPY N/A 12/29/2018   Procedure: LAPAROSCOPY DIAGNOSTIC;  Surgeon: Romualdo Bolk, MD;  Location: [facility];  Service: Gynecology;  Laterality: N/A;  1 hour surgery time/ follow Dr Rica Records case  . OVARIAN CYST REMOVAL Left 12/29/2018   Procedure: LAPARASCOPIC OOPHARECTOMY;  Surgeon: Romualdo Bolk, MD;  Location: [facility];  Service: Gynecology;  Laterality: Left;    Current Outpatient Medications  Medication Sig Dispense Refill  .  albuterol (VENTOLIN HFA) 108 (90 Base) MCG/ACT inhaler Inhale 1 puff into the lungs every 6 (six) hours as needed.    . etonogestrel (NEXPLANON) 68 MG IMPL implant 68 mg by Subdermal route once.    . Galcanezumab-gnlm (EMGALITY) 120 MG/ML SOAJ Inject into the skin every 30 (thirty) days.    Marland Kitchen ibuprofen (ADVIL) 800 MG tablet Take 1 tablet (800 mg total) by mouth every 8 (eight) hours as needed. 30 tablet 0  . lidocaine (XYLOCAINE) 5 % ointment Apply topically 20 minutes prior to intercourse, wipe off just prior to intercourse. 30 g 0  . Rimegepant Sulfate (NURTEC) 75 MG TBDP Take by mouth.    . verapamil (CALAN) 40 MG tablet Take 1 tablet by mouth 2 (two) times daily.      No current  facility-administered medications for this visit.     ALLERGIES: Botox [botulinum toxin type a]  Family History  Problem Relation Age of Onset  . Colon cancer Maternal Grandfather        passed away 04/24/2012    Social History   Socioeconomic History  . Marital status: Married    Spouse name: Not on file  . Number of children: Not on file  . Years of education: Not on file  . Highest education level: Not on file  Occupational History  . Not on file  Tobacco Use  . Smoking status: Current Some Day Smoker    Types: E-cigarettes  . Smokeless tobacco: Never Used  Substance and Sexual Activity  . Alcohol use: Yes    Comment: socially  . Drug use: Never  . Sexual activity: Yes    Birth control/protection: Implant  Other Topics Concern  . Not on file  Social History Narrative  . Not on file   Social Determinants of Health   Financial Resource Strain:   . Difficulty of Paying Living Expenses:   Food Insecurity:   . Worried About Charity fundraiser in the Last Year:   . Arboriculturist in the Last Year:   Transportation Needs:   . Film/video editor (Medical):   Marland Kitchen Lack of Transportation (Non-Medical):   Physical Activity:   . Days of Exercise per Week:   . Minutes of Exercise per Session:   Stress:   . Feeling of Stress :   Social Connections:   . Frequency of Communication with Friends and Family:   . Frequency of Social Gatherings with Friends and Family:   . Attends Religious Services:   . Active Member of Clubs or Organizations:   . Attends Archivist Meetings:   Marland Kitchen Marital Status:   Intimate Partner Violence:   . Fear of Current or Ex-Partner:   . Emotionally Abused:   Marland Kitchen Physically Abused:   . Sexually Abused:     Review of Systems  All other systems reviewed and are negative.   PHYSICAL EXAMINATION:    LMP 04/18/2019     General appearance: alert, cooperative and appears stated age  ASSESSMENT Pelvic/abdominal  pain Constipation Dyspareunia, entry and deep.  Tender uterus and adnexa She has had a normal ultrasound (other than dermoid which has been removed) She has had a laparoscopy without endometriosis She has had negative vaginal and cervical cultures She has been treated for possible PID, no help She has been going to PT which is helping with entry dyspareunia and pelvic floor tenderness, but still has uterine and adnexal tenderness No regular cycles with nexplanon, has intermittent pain, ?GI  vs GYN She tried an IUD which was malpositioned and removed, but with her uterine tenderness I'm hesitant to place another IUD H/O migraine with aura, not a candidate for OCP's    PLAN We discussed a trial of lupron or Orilissa.  I suggest she try the higher dose of Orilissa to see if that makes any difference She will get a second opinioin.   ~30 minutes in total patient care

## 2019-06-22 ENCOUNTER — Telehealth: Payer: Self-pay | Admitting: Hematology and Oncology

## 2019-06-22 NOTE — Telephone Encounter (Signed)
Received a new hem referral from Dr. Elon Spanner for Family history of diseases of the blood and blood-forming organs and certain disorders involving the immune mechanism. Charlene Griffin returned my call and has been scheduled to see Dr. Leonides Schanz on 7/16 at 9am. Pt aware to arrive 15 minutes early.

## 2019-07-24 ENCOUNTER — Inpatient Hospital Stay

## 2019-07-24 ENCOUNTER — Other Ambulatory Visit: Payer: Self-pay

## 2019-07-24 ENCOUNTER — Inpatient Hospital Stay: Attending: Hematology and Oncology | Admitting: Hematology and Oncology

## 2019-07-24 VITALS — BP 106/55 | HR 75 | Temp 97.5°F | Resp 18 | Ht 66.0 in | Wt 164.7 lb

## 2019-07-24 DIAGNOSIS — Z832 Family history of diseases of the blood and blood-forming organs and certain disorders involving the immune mechanism: Secondary | ICD-10-CM | POA: Diagnosis not present

## 2019-07-24 DIAGNOSIS — F17210 Nicotine dependence, cigarettes, uncomplicated: Secondary | ICD-10-CM

## 2019-07-24 DIAGNOSIS — F419 Anxiety disorder, unspecified: Secondary | ICD-10-CM | POA: Diagnosis not present

## 2019-07-24 DIAGNOSIS — Z87891 Personal history of nicotine dependence: Secondary | ICD-10-CM | POA: Diagnosis not present

## 2019-07-24 DIAGNOSIS — F17290 Nicotine dependence, other tobacco product, uncomplicated: Secondary | ICD-10-CM | POA: Diagnosis not present

## 2019-07-24 DIAGNOSIS — Z79818 Long term (current) use of other agents affecting estrogen receptors and estrogen levels: Secondary | ICD-10-CM | POA: Insufficient documentation

## 2019-07-24 DIAGNOSIS — R519 Headache, unspecified: Secondary | ICD-10-CM | POA: Diagnosis not present

## 2019-07-24 DIAGNOSIS — Z8481 Family history of carrier of genetic disease: Secondary | ICD-10-CM | POA: Insufficient documentation

## 2019-07-24 DIAGNOSIS — E282 Polycystic ovarian syndrome: Secondary | ICD-10-CM | POA: Diagnosis present

## 2019-07-24 DIAGNOSIS — K219 Gastro-esophageal reflux disease without esophagitis: Secondary | ICD-10-CM | POA: Diagnosis not present

## 2019-07-24 LAB — CBC WITH DIFFERENTIAL (CANCER CENTER ONLY)
Abs Immature Granulocytes: 0.01 10*3/uL (ref 0.00–0.07)
Basophils Absolute: 0 10*3/uL (ref 0.0–0.1)
Basophils Relative: 0 %
Eosinophils Absolute: 0.1 10*3/uL (ref 0.0–0.5)
Eosinophils Relative: 2 %
HCT: 42.1 % (ref 36.0–46.0)
Hemoglobin: 14.6 g/dL (ref 12.0–15.0)
Immature Granulocytes: 0 %
Lymphocytes Relative: 34 %
Lymphs Abs: 2.1 10*3/uL (ref 0.7–4.0)
MCH: 31.3 pg (ref 26.0–34.0)
MCHC: 34.7 g/dL (ref 30.0–36.0)
MCV: 90.1 fL (ref 80.0–100.0)
Monocytes Absolute: 0.5 10*3/uL (ref 0.1–1.0)
Monocytes Relative: 7 %
Neutro Abs: 3.5 10*3/uL (ref 1.7–7.7)
Neutrophils Relative %: 57 %
Platelet Count: 320 10*3/uL (ref 150–400)
RBC: 4.67 MIL/uL (ref 3.87–5.11)
RDW: 12.3 % (ref 11.5–15.5)
WBC Count: 6.2 10*3/uL (ref 4.0–10.5)
nRBC: 0 % (ref 0.0–0.2)

## 2019-07-24 LAB — CMP (CANCER CENTER ONLY)
ALT: 18 U/L (ref 0–44)
AST: 18 U/L (ref 15–41)
Albumin: 4.7 g/dL (ref 3.5–5.0)
Alkaline Phosphatase: 57 U/L (ref 38–126)
Anion gap: 12 (ref 5–15)
BUN: 13 mg/dL (ref 6–20)
CO2: 24 mmol/L (ref 22–32)
Calcium: 10.1 mg/dL (ref 8.9–10.3)
Chloride: 103 mmol/L (ref 98–111)
Creatinine: 0.8 mg/dL (ref 0.44–1.00)
GFR, Est AFR Am: >60 mL/min (ref >60)
GFR, Estimated: >60 mL/min (ref >60)
Glucose, Bld: 91 mg/dL (ref 70–99)
Potassium: 4.2 mmol/L (ref 3.5–5.1)
Sodium: 139 mmol/L (ref 135–145)
Total Bilirubin: 1.1 mg/dL (ref 0.3–1.2)
Total Protein: 8.5 g/dL — ABNORMAL HIGH (ref 6.5–8.1)

## 2019-07-24 NOTE — Progress Notes (Signed)
[facility] Telephone:([phone]) [phone]   Fax:(336) [phone]  INITIAL CONSULT NOTE  Patient Care Team: Nestor RampMounsey, Anne L, MD as PCP - General (Family Medicine)  [facility] # Family History of Clotting Disorder 1) Patient's sister had Factor V disorder, had PE in setting of OCPs 2) 07/24/2019: establish care with Dr. Leonides Schanzorsey   CHIEF COMPLAINTS/PURPOSE OF CONSULTATION:  "Family History of Clotting Disorder "  HISTORY OF PRESENTING ILLNESS:  Charlene Griffin 29 y.o. female with medical history significant for GERD, anxiety, chronic headaches, and depression who presents for evaluation of a family history of a clotting disorder.   On review of the previous records Charlene Griffin was was noted to have a sister who has "either factor V or factor X deficiency".  Was also reported that her sister had a pulmonary embolism in the setting of OCP use.  The patient worries about her predisposition to pulmonary embolism and therefore has requested a referral to hematology.  On exam today Charlene Griffin notes that she has no personal history of VTE.  She notes that she has never been pregnant and has had no miscarriages, but has never had any signs or symptoms of a clot, including superficial thrombophlebitis.  The patient notes her main reason for her concern is that her sister was diagnosed with factor V Leiden 8 to 10 years ago after taking birth control pills and developing blood clots "in her legs and in her lungs".  It was noted that the patient's father was a carrier for the mutation.  The patient thinks that maybe she was tested at that time for factor V Leiden, but is uncertain.    She presents today because she is currently planned for estrogen-based therapy for PCOS and wanted to be sure that she was not at high risk developing blood clots like her sister.  She notes that other than her sister there have been no reported history of blood clots within the  family.  She notes that she vapes and started vaping in February 2021.  She notes that she drinks alcohol approximately 3 days/week.  She is a never smoker.  A full 10 point ROS is listed below.  MEDICAL HISTORY:  Past Medical History:  Diagnosis Date  . Anxiety   . Chronic headaches   . Depression   . GERD (gastroesophageal reflux disease)   . Migraine with aura   . Migraine without aura   . Umbilical hernia     SURGICAL HISTORY: Past Surgical History:  Procedure Laterality Date  . HERNIA REPAIR     x 2  . LAPAROSCOPY N/A 12/29/2018   Procedure: LAPAROSCOPY DIAGNOSTIC;  Surgeon: Romualdo BolkJertson, Jill Evelyn, MD;  [facility] [place];  Service: Gynecology;  Laterality: N/A;  1 hour surgery time/ follow Dr Rica RecordsSilva's case  . OVARIAN CYST REMOVAL Left 12/29/2018   Procedure: LAPARASCOPIC OOPHARECTOMY;  Surgeon: Romualdo BolkJertson, Jill Evelyn, MD;  [facility] [place];  Service: Gynecology;  Laterality: Left;    SOCIAL HISTORY: Social History   Socioeconomic History  . Marital status: Married    Spouse name: Not on file  . Number of children: Not on file  . Years of education: Not on file  . Highest education level: Not on file  Occupational History  . Not on file  Tobacco Use  . Smoking status: Current Some Day Smoker    Types: E-cigarettes  . Smokeless tobacco: Never Used  Vaping Use  . Vaping Use: Every day  .  Substances: Nicotine, Flavoring  Substance and Sexual Activity  . Alcohol use: Yes    Comment: socially  . Drug use: Never  . Sexual activity: Yes    Birth control/protection: Implant  Other Topics Concern  . Not on file  Social History Narrative  . Not on file   Social Determinants of Health   Financial Resource Strain:   . Difficulty of Paying Living Expenses:   Food Insecurity:   . Worried About Programme researcher, broadcasting/film/video in the Last Year:   . Barista in the Last Year:   Transportation Needs:   . Freight forwarder (Medical):     Marland Kitchen Lack of Transportation (Non-Medical):   Physical Activity:   . Days of Exercise per Week:   . Minutes of Exercise per Session:   Stress:   . Feeling of Stress :   Social Connections:   . Frequency of Communication with Friends and Family:   . Frequency of Social Gatherings with Friends and Family:   . Attends Religious Services:   . Active Member of Clubs or Organizations:   . Attends Banker Meetings:   Marland Kitchen Marital Status:   Intimate Partner Violence:   . Fear of Current or Ex-Partner:   . Emotionally Abused:   Marland Kitchen Physically Abused:   . Sexually Abused:     FAMILY HISTORY: Family History  Problem Relation Age of Onset  . Colon cancer Maternal Grandfather        passed away 05-09-2012    ALLERGIES:  is allergic to botox [botulinum toxin type a].  MEDICATIONS:  Current Outpatient Medications  Medication Sig Dispense Refill  . albuterol (VENTOLIN HFA) 108 (90 Base) MCG/ACT inhaler Inhale 1 puff into the lungs every 6 (six) hours as needed.    . etonogestrel (NEXPLANON) 68 MG IMPL implant 68 mg by Subdermal route once.    . Galcanezumab-gnlm (EMGALITY) 120 MG/ML SOAJ Inject into the skin every 30 (thirty) days.    Marland Kitchen lidocaine (XYLOCAINE) 5 % ointment Apply topically 20 minutes prior to intercourse, wipe off just prior to intercourse. 30 g 0  . Rimegepant Sulfate (NURTEC) 75 MG TBDP Take by mouth.    . verapamil (CALAN) 40 MG tablet Take 1 tablet by mouth 2 (two) times daily.      No current facility-administered medications for this visit.    REVIEW OF SYSTEMS:   Constitutional: ( - ) fevers, ( - )  chills , ( - ) night sweats Eyes: ( - ) blurriness of vision, ( - ) double vision, ( - ) watery eyes Ears, nose, mouth, throat, and face: ( - ) mucositis, ( - ) sore throat Respiratory: ( - ) cough, ( - ) dyspnea, ( - ) wheezes Cardiovascular: ( - ) palpitation, ( - ) chest discomfort, ( - ) lower extremity swelling Gastrointestinal:  ( - ) nausea, ( - ) heartburn, ( -  ) change in bowel habits Skin: ( - ) abnormal skin rashes Lymphatics: ( - ) new lymphadenopathy, ( - ) easy bruising Neurological: ( - ) numbness, ( - ) tingling, ( - ) new weaknesses Behavioral/Psych: ( - ) mood change, ( - ) new changes  All other systems were reviewed with the patient and are negative.  PHYSICAL EXAMINATION: ECOG PERFORMANCE STATUS: 0 - Asymptomatic  Vitals:   07/24/19 1400  BP: (!) 106/55  Pulse: 75  Resp: 18  Temp: (!) 97.5 F (36.4 C)  SpO2: 100%  Filed Weights   07/24/19 1400  Weight: 164 lb 11.2 oz (74.7 kg)    GENERAL: well appearing young Caucasian female in NAD  SKIN: skin color, texture, turgor are normal, no rashes or significant lesions EYES: conjunctiva are pink and non-injected, sclera clear LUNGS: clear to auscultation and percussion with normal breathing effort HEART: regular rate & rhythm and no murmurs and no lower extremity edema Musculoskeletal: no cyanosis of digits and no clubbing  PSYCH: alert & oriented x 3, fluent speech NEURO: no focal motor/sensory deficits  LABORATORY DATA:  I have reviewed the data as listed CBC Latest Ref Rng & Units 07/24/2019 12/31/2018 10/22/2018  WBC 4.0 - 10.5 K/uL 6.2 8.0 5.6  Hemoglobin 12.0 - 15.0 g/dL 97.0 26.3 78.5  Hematocrit 36 - 46 % 42.1 36.4 43.8  Platelets 150 - 400 K/uL 320 267 338    CMP Latest Ref Rng & Units 07/24/2019 12/31/2018 09/18/2016  Glucose 70 - 99 mg/dL 91 86 96  BUN 6 - 20 mg/dL 13 9 15   Creatinine 0.44 - 1.00 mg/dL 8.85 0.27  Sodium 135 - 145 mmol/L 139 138 136  Potassium 3.5 - 5.1 mmol/L 4.2 3.3(L) 4.2  Chloride 98 - 111 mmol/L 103 104 101  CO2 22 - 32 mmol/L 24 26 29   Calcium 8.9 - 10.3 mg/dL 7.41 ) 28.7  Total Protein 6.5 - 8.1 g/dL 8.6(V) 6.1(L) 9.0(H)  Total Bilirubin 0.3 - 1.2 mg/dL 1.1 0.5 0.7  Alkaline Phos 38 - 126 U/L 57 33(L) 75  AST 15 - 41 U/L 18 25 27   ALT 0 - 44 U/L 18 28 20     RADIOGRAPHIC STUDIES: No results found.  ASSESSMENT &  PLAN 67.2 29 y.o. female with medical history significant for GERD, anxiety, chronic headaches, and depression who presents for evaluation of a family history of a clotting disorder.  After review of the labs, discussion with the patient, and reviewed the prior records the patient's findings are most consistent with a family history of factor V Leiden disorder.  The patient's father is reportedly heterozygous for the mutation and the patient's sister experienced blood clots while on birth control and reportedly also has factor V Leiden, though it is unclear if this is heterozygous or homozygous for the mutation.  After detailed discussion with the patient about the risks, benefits, and possible outcomes of the testing she was agreeable to proceeding forward with a check for factor V Leiden.  I noted to her that this would not necessarily change our management of her moving forward and that she would not require prophylactic anticoagulation therapy outside of the setting of pregnancy.  It is reassuring that this patient has never had a blood clot.  In the event that the patient is found to be either heterozygous or homozygous for the mutation this could be taken into consideration when determining what birth control medications are most appropriate for her.  As such I think it is reasonable to proceed with testing today.  # Family History of Clotting Disorder --given patient's strong family history and per patient request, will order studies for Factor V Leiden mutation to assess for the genetic defect --noted to patient that the results of this testing would not require her to start anticoagulation therapy or imply that she has a clotting disorder --if patient is found to be heterozygous for the mutation would need to have a discussion of the risks/benefits of treatment with OCPs --carriers and homozygotes with FVL are at  increased risk of thrombosis compared to the general population. This  risk is increased while on OCPs (Eur J Contracept Reprod Health Care. 2000 Jun;5(2):105-12). --patient notes she would not want to be on estrogen containing birth control if she is found to be homozygous for the mutation. This would be a reasonable approach given the markedly increase risk of VTE in this population.  --no need for routine f/u in our clinic. We will call the patient to discuss the results of the testing.   Orders Placed This Encounter  Procedures  . CBC with Differential (Cancer Center Only)    Standing Status:   Future    Number of Occurrences:   1    Standing Expiration Date:   07/23/2020  . CMP (Cancer Center only)    Standing Status:   Future    Number of Occurrences:   1    Standing Expiration Date:   07/23/2020  . Factor 5 Leiden*    Standing Status:   Future    Number of Occurrences:   1    Standing Expiration Date:   07/23/2020    All questions were answered. The patient knows to call the clinic with any problems, questions or concerns.  A total of more than 45 minutes were spent on this encounter and over half of that time was spent on counseling and coordination of care as outlined above.   Ulysees Barns, MD Department of Hematology/Oncology [facility] Phone: [phone] Pager: [phone] Email: [email or web address]  07/26/2019 4:04 PM   Literature Support:  1) Sp[place]agl [place]. Are factor V Leiden carriers who use oral contraceptives at extreme risk for venous thromboembolism? Eur J Contracept Reprod Health Care. 2000 Jun;5(2):105-12  --The results confirm the increased relative risk of idiopathic venous thromboembolism for users of oral contraceptives and factor V Leiden carriers. However, we suspect that the true risk for women who are factor V Leiden carriers may be increased two- to four-fold rather than seven-fold or more, and that the risk for the combination of factor V Leiden and  oral contraceptive use may be increased in the order often- to 15-fold rather than over 30-fold.  2) Katha Cabal EF, Veeger [place], [place] S, Hardy, Prins MH, Bller HR, Allegra Lai. Thrombotic risk during oral contraceptive use and pregnancy in women with factor V Leiden or prothrombin mutation: a rational approach to contraception. Blood. 2011 Aug 25;118(8):2055-61  --Although in the women with a factor Leiden and prothrombin-G20210 mutation, the absolute risk of VTE increased during combined oral contraceptive use, this risk was importantly lower than the absolute risk observed during the pregnancy-postpartum period. These data provide evidence that the policy to contraindicate combined oral contraceptive use in these women needs reconsideration. The results of the study do not allow to "promote" a combined oral contraceptive use in asymptomatic family-carriers of FVL or PT G20210A, but indicate that when combined oral contraceptive use is discontinued the need for adequate alternative contraception has high priority.  --Although the absolute risk of VTE significantly increased during combined oral contraceptive use (up to 0.86 [95% CI, 0.10-3.11] per 100 pill-years) in women with combined defects, the absolute risk during the pregnancy-postpartum period was by far the most important (up to 7.65 [95% CI, 3.08-15.76] per 100 pregnancy years). The substantially higher risk of VTE during the pregnancy-postpartum period was confirmed by an adjusted HR of 16.0 (95% CI, 8.0-32.2), compared with an adjusted HR of 2.2 (95% CI,  1.1-4.0) for combined oral contraceptive use.

## 2019-07-29 LAB — FACTOR 5 LEIDEN

## 2019-08-10 ENCOUNTER — Ambulatory Visit: Payer: Self-pay | Admitting: General Surgery

## 2019-08-24 ENCOUNTER — Encounter (HOSPITAL_COMMUNITY): Payer: Self-pay

## 2019-08-24 NOTE — Progress Notes (Addendum)
COVID Vaccine Completed:Yes Date COVID Vaccine completed:02/2019 COVID vaccine manufacturer: Pfizer       PCP - Dr. Carlton Adam Cardiologist - N/A  Chest x-ray - 12/31/18 in epic EKG - N/A Stress Test - N/A ECHO - N/A Cardiac Cath - N/A  Sleep Study - Yes CPAP - No sleep apnea  Fasting Blood Sugar - N/A Checks Blood Sugar __N/A___ times a day  Blood Thinner Instructions:N/A Aspirin Instructions:N/A Last Dose:N/A  Anesthesia review: N/A  Patient denies shortness of breath, fever, cough and chest pain at PAT appointment   Patient verbalized understanding of instructions that were given to them at the PAT appointment. Patient was also instructed that they will need to review over the PAT instructions again at home before surgery.

## 2019-08-24 NOTE — Patient Instructions (Addendum)
DUE TO COVID-19 ONLY ONE VISITOR IS ALLOWED TO COME WITH YOU AND STAY IN THE WAITING ROOM ONLY DURING PRE OP AND PROCEDURE.    COVID SWAB TESTING MUST BE COMPLETED ON:  Saturday, Aug. 21, 2021 at 9:00 AM   [address] [place], [place] [address]  (Must self quarantine after testing. Follow instructions on handout.)   Your procedure is scheduled on: Wednesday, Aug. 25, 2021   Report to [facility] Main  Entrance    Report to admitting at 6:15 AM   Call this number if you have problems the morning of surgery [phone]   Do not eat food :After Midnight.   May have liquids until   5:15 AM day of surgery  CLEAR LIQUID DIET  Foods Allowed                                                                     Foods Excluded  Water, Black Coffee and tea, regular and decaf                             liquids that you cannot  Plain Jell-O in any flavor  (No red)                                           see through such as: Fruit ices (not with fruit pulp)                                     milk, soups, orange juice              Iced Popsicles (No red)                                    All solid food                                   Apple juices Sports drinks like Gatorade (No red) Lightly seasoned clear broth or consume(fat free) Sugar, honey syrup  Sample Menu Breakfast                                Lunch                                     Supper Cranberry juice                    Beef broth                            Chicken broth Jell-O  Grape juice                           Apple juice Coffee or tea                        Jell-O                                      Popsicle                                                Coffee or tea                        Coffee or tea      Complete one Ensure drink the morning of surgery at  5:15 AM the day of surgery.   Oral Hygiene is also important to reduce your risk of infection.                                     Remember - BRUSH YOUR TEETH THE MORNING OF SURGERY WITH YOUR REGULAR TOOTHPASTE   Do NOT smoke after Midnight   Take these medicines the morning of surgery with A SIP OF WATER: Verapamil   Bring Asthma Inhaler day of surgery                               You may not have any metal on your body including hair pins, jewelry, and body piercings             Do not wear make-up, lotions, powders, perfumes/cologne, or deodorant             Do not wear nail polish.  Do not shave  48 hours prior to surgery.              Do not bring valuables to the hospital. [facility] IS NOT             RESPONSIBLE   FOR VALUABLES.   Contacts, dentures or bridgework may not be worn into surgery.    Patients discharged the day of surgery will not be allowed to drive home.   Special Instructions: Bring a copy of your healthcare power of attorney and living will documents         the day of surgery if you haven't scanned them in before.              Please read over the following fact sheets you were given: IF YOU HAVE QUESTIONS ABOUT YOUR PRE OP INSTRUCTIONS PLEASE CALL [phone]   [facility] - Preparing for Surgery Before surgery, you can play an important role.  Because skin is not sterile, your skin needs to be as free of germs as possible.  You can reduce the number of germs on your skin by washing with CHG (chlorahexidine gluconate) soap before surgery.  CHG is an antiseptic cleaner which kills germs and bonds with the skin to continue killing germs even after washing. Please DO NOT use if you have an  allergy to CHG or antibacterial soaps.  If your skin becomes reddened/irritated stop using the CHG and inform your nurse when you arrive at Short Stay. Do not shave (including legs and underarms) for at least 48 hours prior to the first CHG shower.  You may shave your face/neck.  Please follow these instructions carefully:  1.  Shower with CHG Soap the night before surgery and  the  morning of surgery.  2.  If you choose to wash your hair, wash your hair first as usual with your normal  shampoo.  3.  After you shampoo, rinse your hair and body thoroughly to remove the shampoo.                             4.  Use CHG as you would any other liquid soap.  You can apply chg directly to the skin and wash.  Gently with a scrungie or clean washcloth.  5.  Apply the CHG Soap to your body ONLY FROM THE NECK DOWN.   Do   not use on face/ open                           Wound or open sores. Avoid contact with eyes, ears mouth and   genitals (private parts).                       Wash face,  Genitals (private parts) with your normal soap.             6.  Wash thoroughly, paying special attention to the area where your    surgery  will be performed.  7.  Thoroughly rinse your body with warm water from the neck down.  8.  DO NOT shower/wash with your normal soap after using and rinsing off the CHG Soap.                9.  Pat yourself dry with a clean towel.            10.  Wear clean pajamas.            11.  Place clean sheets on your bed the night of your first shower and do not  sleep with pets. Day of Surgery : Do not apply any lotions/deodorants the morning of surgery.  Please wear clean clothes to the hospital/surgery center.  FAILURE TO FOLLOW THESE INSTRUCTIONS MAY RESULT IN THE CANCELLATION OF YOUR SURGERY  PATIENT SIGNATURE_________________________________  NURSE SIGNATURE__________________________________  ________________________________________________________________________

## 2019-08-25 ENCOUNTER — Encounter (HOSPITAL_COMMUNITY)
Admission: RE | Admit: 2019-08-25 | Discharge: 2019-08-25 | Disposition: A | Discharge status: Home / Self Care | Source: Ambulatory Visit | Attending: General Surgery | Admitting: General Surgery

## 2019-08-25 ENCOUNTER — Encounter (HOSPITAL_COMMUNITY): Payer: Self-pay

## 2019-08-25 ENCOUNTER — Other Ambulatory Visit: Payer: Self-pay

## 2019-08-25 DIAGNOSIS — U071 COVID-19: Secondary | ICD-10-CM | POA: Insufficient documentation

## 2019-08-25 HISTORY — DX: Insomnia, unspecified: G47.00

## 2019-08-25 HISTORY — DX: Unspecified hemorrhoids: K64.9

## 2019-08-25 HISTORY — DX: Other specified postprocedural states: Z98.890

## 2019-08-25 HISTORY — DX: Attention-deficit hyperactivity disorder, unspecified type: F90.9

## 2019-08-25 HISTORY — DX: Nonscarring hair loss, unspecified: L65.9

## 2019-08-25 HISTORY — DX: Other complications of anesthesia, initial encounter: T88.59XA

## 2019-08-25 HISTORY — DX: Major depressive disorder, single episode, unspecified: F32.9

## 2019-08-25 HISTORY — DX: Bronchitis, not specified as acute or chronic: J40

## 2019-08-25 HISTORY — DX: Tinnitus, unspecified ear: H93.19

## 2019-08-25 HISTORY — DX: Panic disorder (episodic paroxysmal anxiety): F41.0

## 2019-08-25 HISTORY — DX: Other specified postprocedural states: R11.2

## 2019-08-25 HISTORY — DX: Polycystic ovarian syndrome: E28.2

## 2019-08-25 LAB — CBC
HCT: 42.3 % (ref 36.0–46.0)
Hemoglobin: 14.5 g/dL (ref 12.0–15.0)
MCH: 30.9 pg (ref 26.0–34.0)
MCHC: 34.3 g/dL (ref 30.0–36.0)
MCV: 90.2 fL (ref 80.0–100.0)
Platelets: 322 10*3/uL (ref 150–400)
RBC: 4.69 MIL/uL (ref 3.87–5.11)
RDW: 12.4 % (ref 11.5–15.5)
WBC: 4.8 10*3/uL (ref 4.0–10.5)
nRBC: 0 % (ref 0.0–0.2)

## 2019-08-29 ENCOUNTER — Other Ambulatory Visit (HOSPITAL_COMMUNITY)
Admission: RE | Admit: 2019-08-29 | Discharge: 2019-08-29 | Disposition: A | Discharge status: Home / Self Care | Source: Ambulatory Visit | Attending: General Surgery | Admitting: General Surgery

## 2019-08-29 DIAGNOSIS — Z20822 Contact with and (suspected) exposure to covid-19: Secondary | ICD-10-CM | POA: Insufficient documentation

## 2019-08-29 DIAGNOSIS — Z01812 Encounter for preprocedural laboratory examination: Secondary | ICD-10-CM | POA: Insufficient documentation

## 2019-08-29 LAB — SARS CORONAVIRUS 2 (TAT 6-24 HRS): SARS Coronavirus 2: NEGATIVE

## 2019-09-01 MED ORDER — BUPIVACAINE LIPOSOME 1.3 % IJ SUSP
20.0000 mL | Freq: Once | INTRAMUSCULAR | Status: DC
  Filled 2019-09-01 (×2): qty 20

## 2019-09-01 NOTE — Anesthesia Preprocedure Evaluation (Addendum)
Anesthesia Evaluation  Patient identified by MRN, date of birth, ID band Patient awake    Reviewed: Allergy & Precautions, NPO status , Patient's Chart, lab work & pertinent test results  History of Anesthesia Complications (+) PONV and history of anesthetic complications  Airway Mallampati: II  TM Distance: >3 FB Neck ROM: Full    Dental no notable dental hx.    Pulmonary neg pulmonary ROS, Current Smoker and Patient abstained from smoking.,    Pulmonary exam normal        Cardiovascular negative cardio ROS Normal cardiovascular exam     Neuro/Psych negative neurological ROS  negative psych ROS   GI/Hepatic Neg liver ROS, GERD  Controlled,Umbilical hernia   Endo/Other  negative endocrine ROS  Renal/GU negative Renal ROS  negative genitourinary   Musculoskeletal negative musculoskeletal ROS (+)   Abdominal   Peds  Hematology negative hematology ROS (+)   Anesthesia Other Findings Day of surgery medications reviewed with patient.  Reproductive/Obstetrics negative OB ROS                            Anesthesia Physical Anesthesia Plan  ASA: II  Anesthesia Plan: General   Post-op Pain Management:    Induction: Intravenous  PONV Risk Score and Plan: 4 or greater and Treatment may vary due to age or medical condition, Ondansetron, Dexamethasone, Midazolam, Scopolamine patch - Pre-op, Propofol infusion and TIVA  Airway Management Planned: Oral ETT  Additional Equipment: None  Intra-op Plan:   Post-operative Plan: Extubation in OR  Informed Consent: I have reviewed the patients History and Physical, chart, labs and discussed the procedure including the risks, benefits and alternatives for the proposed anesthesia with the patient or authorized representative who has indicated his/her understanding and acceptance.     Dental advisory given  Plan Discussed with: CRNA  Anesthesia  Plan Comments:        Anesthesia Quick Evaluation

## 2019-09-02 ENCOUNTER — Ambulatory Visit (HOSPITAL_COMMUNITY): Admitting: Certified Registered Nurse Anesthetist

## 2019-09-02 ENCOUNTER — Encounter (HOSPITAL_COMMUNITY)
Admission: RE | Disposition: A | Discharge status: Home / Self Care | Payer: Self-pay | Source: Home / Self Care | Attending: General Surgery

## 2019-09-02 ENCOUNTER — Observation Stay (HOSPITAL_COMMUNITY)
Admission: RE | Admit: 2019-09-02 | Discharge: 2019-09-03 | Disposition: A | Discharge status: Home / Self Care | Attending: General Surgery | Admitting: General Surgery

## 2019-09-02 ENCOUNTER — Other Ambulatory Visit: Payer: Self-pay

## 2019-09-02 ENCOUNTER — Encounter (HOSPITAL_COMMUNITY): Payer: Self-pay | Admitting: General Surgery

## 2019-09-02 DIAGNOSIS — F17290 Nicotine dependence, other tobacco product, uncomplicated: Secondary | ICD-10-CM | POA: Diagnosis not present

## 2019-09-02 DIAGNOSIS — F909 Attention-deficit hyperactivity disorder, unspecified type: Secondary | ICD-10-CM | POA: Diagnosis not present

## 2019-09-02 DIAGNOSIS — K219 Gastro-esophageal reflux disease without esophagitis: Secondary | ICD-10-CM | POA: Diagnosis not present

## 2019-09-02 DIAGNOSIS — K432 Incisional hernia without obstruction or gangrene: Secondary | ICD-10-CM | POA: Diagnosis present

## 2019-09-02 HISTORY — PX: INCISIONAL HERNIA REPAIR: SHX193

## 2019-09-02 LAB — CREATININE, SERUM
Creatinine, Ser: 0.64 mg/dL (ref 0.44–1.00)
GFR calc Af Amer: >60 mL/min (ref >60)
GFR calc non Af Amer: >60 mL/min (ref >60)

## 2019-09-02 LAB — PREGNANCY, URINE: Preg Test, Ur: NEGATIVE

## 2019-09-02 LAB — CBC
HCT: 38.8 % (ref 36.0–46.0)
Hemoglobin: 13.2 g/dL (ref 12.0–15.0)
MCH: 31.1 pg (ref 26.0–34.0)
MCHC: 34 g/dL (ref 30.0–36.0)
MCV: 91.5 fL (ref 80.0–100.0)
Platelets: 270 10*3/uL (ref 150–400)
RBC: 4.24 MIL/uL (ref 3.87–5.11)
RDW: 12.5 % (ref 11.5–15.5)
WBC: 12.8 10*3/uL — ABNORMAL HIGH (ref 4.0–10.5)
nRBC: 0 % (ref 0.0–0.2)

## 2019-09-02 SURGERY — REPAIR, HERNIA, INCISIONAL, LAPAROSCOPIC
Anesthesia: General

## 2019-09-02 MED ORDER — PROPOFOL 10 MG/ML IV BOLUS
INTRAVENOUS | Status: AC
  Filled 2019-09-02: qty 20

## 2019-09-02 MED ORDER — ONDANSETRON 4 MG PO TBDP: 4.0000 mg | ORAL_TABLET | Freq: Three times a day (TID) | ORAL | 0 refills | Status: DC | PRN

## 2019-09-02 MED ORDER — PROMETHAZINE HCL 25 MG/ML IJ SOLN: 6.2500 mg | INTRAMUSCULAR | Status: DC | PRN

## 2019-09-02 MED ORDER — CHLORHEXIDINE GLUCONATE CLOTH 2 % EX PADS: 6.0000 | MEDICATED_PAD | Freq: Once | CUTANEOUS | Status: DC

## 2019-09-02 MED ORDER — KETOROLAC TROMETHAMINE 30 MG/ML IJ SOLN
30.0000 mg | Freq: Four times a day (QID) | INTRAMUSCULAR | Status: DC | PRN
  Administered 2019-09-03: 30 mg via INTRAVENOUS
  Filled 2019-09-02: qty 1

## 2019-09-02 MED ORDER — MIDAZOLAM HCL 5 MG/5ML IJ SOLN
INTRAMUSCULAR | Status: DC | PRN
  Administered 2019-09-02: 2 mg via INTRAVENOUS

## 2019-09-02 MED ORDER — FENTANYL CITRATE (PF) 100 MCG/2ML IJ SOLN
INTRAMUSCULAR | Status: AC
  Filled 2019-09-02: qty 2

## 2019-09-02 MED ORDER — ROCURONIUM BROMIDE 10 MG/ML (PF) SYRINGE
PREFILLED_SYRINGE | INTRAVENOUS | Status: AC
  Filled 2019-09-02: qty 10

## 2019-09-02 MED ORDER — DIPHENHYDRAMINE HCL 50 MG/ML IJ SOLN: 25.0000 mg | Freq: Four times a day (QID) | INTRAMUSCULAR | Status: DC | PRN

## 2019-09-02 MED ORDER — ORAL CARE MOUTH RINSE: 15.0000 mL | Freq: Once | OROMUCOSAL | Status: AC

## 2019-09-02 MED ORDER — PROPOFOL 500 MG/50ML IV EMUL
INTRAVENOUS | Status: DC | PRN
  Administered 2019-09-02: 150 ug/kg/min via INTRAVENOUS
  Administered 2019-09-02: 175 ug/kg/min via INTRAVENOUS

## 2019-09-02 MED ORDER — SCOPOLAMINE 1 MG/3DAYS TD PT72
1.0000 | MEDICATED_PATCH | Freq: Once | TRANSDERMAL | Status: DC
  Administered 2019-09-02: 1.5 mg via TRANSDERMAL
  Filled 2019-09-02: qty 1

## 2019-09-02 MED ORDER — FENTANYL CITRATE (PF) 100 MCG/2ML IJ SOLN
25.0000 ug | INTRAMUSCULAR | Status: DC | PRN
  Administered 2019-09-02 (×3): 50 ug via INTRAVENOUS

## 2019-09-02 MED ORDER — ONDANSETRON 4 MG PO TBDP: 4.0000 mg | ORAL_TABLET | Freq: Four times a day (QID) | ORAL | Status: DC | PRN

## 2019-09-02 MED ORDER — GABAPENTIN 300 MG PO CAPS
300.0000 mg | ORAL_CAPSULE | ORAL | Status: AC
  Administered 2019-09-02: 300 mg via ORAL
  Filled 2019-09-02: qty 1

## 2019-09-02 MED ORDER — FENTANYL CITRATE (PF) 100 MCG/2ML IJ SOLN
INTRAMUSCULAR | Status: DC | PRN
Start: 2019-09-02 — End: 2019-09-02
  Administered 2019-09-02 (×4): 50 ug via INTRAVENOUS

## 2019-09-02 MED ORDER — KETOROLAC TROMETHAMINE 15 MG/ML IJ SOLN
15.0000 mg | INTRAMUSCULAR | Status: AC
  Administered 2019-09-02: 15 mg via INTRAVENOUS

## 2019-09-02 MED ORDER — LIDOCAINE 2% (20 MG/ML) 5 ML SYRINGE
INTRAMUSCULAR | Status: AC
  Filled 2019-09-02: qty 5

## 2019-09-02 MED ORDER — OXYCODONE HCL 5 MG PO TABS
5.0000 mg | ORAL_TABLET | ORAL | Status: DC | PRN
  Administered 2019-09-02 – 2019-09-03 (×3): 10 mg via ORAL
  Filled 2019-09-02 (×3): qty 2

## 2019-09-02 MED ORDER — LIDOCAINE 2% (20 MG/ML) 5 ML SYRINGE
INTRAMUSCULAR | Status: DC | PRN
  Administered 2019-09-02: 80 mg via INTRAVENOUS

## 2019-09-02 MED ORDER — OXYCODONE HCL 5 MG PO TABS
5.0000 mg | ORAL_TABLET | Freq: Once | ORAL | Status: AC | PRN
  Administered 2019-09-02: 5 mg via ORAL

## 2019-09-02 MED ORDER — BUPIVACAINE-EPINEPHRINE 0.25% -1:200000 IJ SOLN
INTRAMUSCULAR | Status: DC | PRN
  Administered 2019-09-02: 30 mL

## 2019-09-02 MED ORDER — TRAMADOL HCL 50 MG PO TABS: 50.0000 mg | ORAL_TABLET | Freq: Four times a day (QID) | ORAL | Status: DC | PRN

## 2019-09-02 MED ORDER — ONDANSETRON HCL 4 MG/2ML IJ SOLN
4.0000 mg | Freq: Four times a day (QID) | INTRAMUSCULAR | Status: DC | PRN
  Administered 2019-09-02 – 2019-09-03 (×4): 4 mg via INTRAVENOUS
  Filled 2019-09-02 (×3): qty 2

## 2019-09-02 MED ORDER — IBUPROFEN 800 MG PO TABS: 800.0000 mg | ORAL_TABLET | Freq: Three times a day (TID) | ORAL | 0 refills | Status: DC | PRN

## 2019-09-02 MED ORDER — POLYETHYLENE GLYCOL 3350 17 G PO PACK: 17.0000 g | PACK | Freq: Every day | ORAL | Status: DC | PRN

## 2019-09-02 MED ORDER — ONDANSETRON HCL 4 MG/2ML IJ SOLN
INTRAMUSCULAR | Status: AC
  Filled 2019-09-02: qty 2

## 2019-09-02 MED ORDER — KETOROLAC TROMETHAMINE 30 MG/ML IJ SOLN
30.0000 mg | Freq: Four times a day (QID) | INTRAMUSCULAR | Status: AC
  Administered 2019-09-02: 30 mg via INTRAVENOUS
  Filled 2019-09-02: qty 1

## 2019-09-02 MED ORDER — ROCURONIUM BROMIDE 10 MG/ML (PF) SYRINGE
PREFILLED_SYRINGE | INTRAVENOUS | Status: DC | PRN
  Administered 2019-09-02: 10 mg via INTRAVENOUS
  Administered 2019-09-02: 50 mg via INTRAVENOUS

## 2019-09-02 MED ORDER — HYDROMORPHONE HCL 1 MG/ML IJ SOLN
1.0000 mg | INTRAMUSCULAR | Status: DC | PRN
  Administered 2019-09-02 (×3): 1 mg via INTRAVENOUS
  Filled 2019-09-02 (×2): qty 1

## 2019-09-02 MED ORDER — ONDANSETRON HCL 4 MG/2ML IJ SOLN
INTRAMUSCULAR | Status: DC | PRN
  Administered 2019-09-02: 4 mg via INTRAVENOUS

## 2019-09-02 MED ORDER — 0.9 % SODIUM CHLORIDE (POUR BTL) OPTIME
TOPICAL | Status: DC | PRN
  Administered 2019-09-02: 1000 mL

## 2019-09-02 MED ORDER — OXYCODONE HCL 5 MG PO TABS
ORAL_TABLET | ORAL | Status: AC
  Filled 2019-09-02: qty 1

## 2019-09-02 MED ORDER — SIMETHICONE 80 MG PO CHEW: 40.0000 mg | CHEWABLE_TABLET | Freq: Four times a day (QID) | ORAL | Status: DC | PRN

## 2019-09-02 MED ORDER — ENSURE PRE-SURGERY PO LIQD
296.0000 mL | Freq: Once | ORAL | Status: DC
  Filled 2019-09-02: qty 296

## 2019-09-02 MED ORDER — BUPIVACAINE-EPINEPHRINE (PF) 0.25% -1:200000 IJ SOLN
INTRAMUSCULAR | Status: AC
  Filled 2019-09-02: qty 30

## 2019-09-02 MED ORDER — SODIUM CHLORIDE 0.9 % IV SOLN: INTRAVENOUS | Status: DC

## 2019-09-02 MED ORDER — PROPOFOL 10 MG/ML IV BOLUS
INTRAVENOUS | Status: DC | PRN
  Administered 2019-09-02: 160 mg via INTRAVENOUS

## 2019-09-02 MED ORDER — LACTATED RINGERS IV SOLN: INTRAVENOUS | Status: DC

## 2019-09-02 MED ORDER — MIDAZOLAM HCL 2 MG/2ML IJ SOLN
INTRAMUSCULAR | Status: AC
  Filled 2019-09-02: qty 2

## 2019-09-02 MED ORDER — ACETAMINOPHEN 500 MG PO TABS
1000.0000 mg | ORAL_TABLET | Freq: Four times a day (QID) | ORAL | Status: DC
  Administered 2019-09-02 (×2): 1000 mg via ORAL
  Filled 2019-09-02 (×2): qty 2

## 2019-09-02 MED ORDER — DEXAMETHASONE SODIUM PHOSPHATE 10 MG/ML IJ SOLN
INTRAMUSCULAR | Status: DC | PRN
  Administered 2019-09-02: 6 mg via INTRAVENOUS

## 2019-09-02 MED ORDER — HYDROMORPHONE HCL 1 MG/ML IJ SOLN
INTRAMUSCULAR | Status: AC
  Filled 2019-09-02: qty 1

## 2019-09-02 MED ORDER — OXYCODONE HCL 5 MG/5ML PO SOLN: 5.0000 mg | Freq: Once | ORAL | Status: AC | PRN

## 2019-09-02 MED ORDER — OXYCODONE HCL 5 MG PO TABS: 5.0000 mg | ORAL_TABLET | Freq: Four times a day (QID) | ORAL | 0 refills | Status: DC | PRN

## 2019-09-02 MED ORDER — BUPIVACAINE LIPOSOME 1.3 % IJ SUSP
INTRAMUSCULAR | Status: DC | PRN
  Administered 2019-09-02: 20 mL

## 2019-09-02 MED ORDER — VERAPAMIL HCL 40 MG PO TABS
40.0000 mg | ORAL_TABLET | Freq: Three times a day (TID) | ORAL | Status: DC | PRN
  Filled 2019-09-02: qty 1

## 2019-09-02 MED ORDER — DEXAMETHASONE SODIUM PHOSPHATE 10 MG/ML IJ SOLN
INTRAMUSCULAR | Status: AC
  Filled 2019-09-02: qty 1

## 2019-09-02 MED ORDER — CHLORHEXIDINE GLUCONATE 0.12 % MT SOLN
15.0000 mL | Freq: Once | OROMUCOSAL | Status: AC
  Administered 2019-09-02: 15 mL via OROMUCOSAL

## 2019-09-02 MED ORDER — CEFAZOLIN SODIUM-DEXTROSE 2-4 GM/100ML-% IV SOLN
2.0000 g | INTRAVENOUS | Status: AC
  Administered 2019-09-02: 2 g via INTRAVENOUS
  Filled 2019-09-02: qty 100

## 2019-09-02 MED ORDER — RIMEGEPANT SULFATE 75 MG PO TBDP: 75.0000 mg | ORAL_TABLET | Freq: Every day | ORAL | Status: DC | PRN

## 2019-09-02 MED ORDER — SUGAMMADEX SODIUM 200 MG/2ML IV SOLN
INTRAVENOUS | Status: DC | PRN
  Administered 2019-09-02: 150 mg via INTRAVENOUS

## 2019-09-02 MED ORDER — ACETAMINOPHEN 500 MG PO TABS
1000.0000 mg | ORAL_TABLET | Freq: Once | ORAL | Status: AC
  Administered 2019-09-02: 1000 mg via ORAL
  Filled 2019-09-02: qty 2

## 2019-09-02 MED ORDER — DIPHENHYDRAMINE HCL 25 MG PO CAPS: 25.0000 mg | ORAL_CAPSULE | Freq: Four times a day (QID) | ORAL | Status: DC | PRN

## 2019-09-02 MED ORDER — ALBUTEROL SULFATE (2.5 MG/3ML) 0.083% IN NEBU: 3.0000 mL | INHALATION_SOLUTION | Freq: Four times a day (QID) | RESPIRATORY_TRACT | Status: DC | PRN

## 2019-09-02 MED ORDER — ENOXAPARIN SODIUM 40 MG/0.4ML ~~LOC~~ SOLN
40.0000 mg | SUBCUTANEOUS | Status: DC
  Administered 2019-09-03: 08:00:00 40 mg via SUBCUTANEOUS
  Filled 2019-09-02: qty 0.4

## 2019-09-02 MED ORDER — ACETAMINOPHEN 500 MG PO TABS: 1000.0000 mg | ORAL_TABLET | ORAL | Status: DC

## 2019-09-02 SURGICAL SUPPLY — 42 items
APPLIER CLIP 5 13 M/L LIGAMAX5 (MISCELLANEOUS)
BENZOIN TINCTURE PRP APPL 2/3 (GAUZE/BANDAGES/DRESSINGS) ×3 IMPLANT
BINDER ABDOMINAL 12 ML 46-62 (SOFTGOODS) ×3 IMPLANT
BNDG ADH 1X3 SHEER STRL LF (GAUZE/BANDAGES/DRESSINGS) ×9 IMPLANT
CABLE HIGH FREQUENCY MONO STRZ (ELECTRODE) ×3 IMPLANT
CHLORAPREP W/TINT 26 (MISCELLANEOUS) ×3 IMPLANT
CLIP APPLIE 5 13 M/L LIGAMAX5 (MISCELLANEOUS) IMPLANT
CLOSURE WOUND 1/2 X4 (GAUZE/BANDAGES/DRESSINGS) ×1
COVER SURGICAL LIGHT HANDLE (MISCELLANEOUS) ×3 IMPLANT
COVER WAND RF STERILE (DRAPES) IMPLANT
DECANTER SPIKE VIAL GLASS SM (MISCELLANEOUS) ×3 IMPLANT
DERMABOND ADVANCED (GAUZE/BANDAGES/DRESSINGS) ×2
DERMABOND ADVANCED .7 DNX12 (GAUZE/BANDAGES/DRESSINGS) ×1 IMPLANT
DEVICE SECURE STRAP 25 ABSORB (INSTRUMENTS) ×6 IMPLANT
DRAIN CHANNEL 19F RND (DRAIN) IMPLANT
ELECT REM PT RETURN 15FT ADLT (MISCELLANEOUS) ×3 IMPLANT
EVACUATOR SILICONE 100CC (DRAIN) IMPLANT
GLOVE BIOGEL PI IND STRL 7.0 (GLOVE) ×1 IMPLANT
GLOVE BIOGEL PI INDICATOR 7.0 (GLOVE) ×2
GLOVE SURG SS PI 7.0 STRL IVOR (GLOVE) ×3 IMPLANT
GOWN STRL REUS W/TWL LRG LVL3 (GOWN DISPOSABLE) ×3 IMPLANT
GOWN STRL REUS W/TWL XL LVL3 (GOWN DISPOSABLE) ×6 IMPLANT
GRASPER SUT TROCAR 14GX15 (MISCELLANEOUS) ×3 IMPLANT
KIT BASIN OR (CUSTOM PROCEDURE TRAY) ×3 IMPLANT
KIT TURNOVER KIT A (KITS) IMPLANT
MESH VENTRALIGHT ST 6IN CRC (Mesh General) ×3 IMPLANT
PENCIL SMOKE EVACUATOR (MISCELLANEOUS) IMPLANT
SCISSORS LAP 5X35 DISP (ENDOMECHANICALS) ×3 IMPLANT
SET IRRIG TUBING LAPAROSCOPIC (IRRIGATION / IRRIGATOR) IMPLANT
SET TUBE SMOKE EVAC HIGH FLOW (TUBING) ×3 IMPLANT
SHEARS HARMONIC ACE PLUS 36CM (ENDOMECHANICALS) IMPLANT
SLEEVE XCEL OPT CAN 5 100 (ENDOMECHANICALS) ×3 IMPLANT
STRIP CLOSURE SKIN 1/2X4 (GAUZE/BANDAGES/DRESSINGS) ×2 IMPLANT
SUT ETHILON 2 0 PS N (SUTURE) IMPLANT
SUT MNCRL AB 4-0 PS2 18 (SUTURE) ×3 IMPLANT
SUT NOVA NAB GS-21 0 18 T12 DT (SUTURE) ×6 IMPLANT
SUT PDS AB 0 CT 36 (SUTURE) ×3 IMPLANT
SUT VICRYL 0 UR6 27IN ABS (SUTURE) IMPLANT
TOWEL OR 17X26 10 PK STRL BLUE (TOWEL DISPOSABLE) ×3 IMPLANT
TRAY LAPAROSCOPIC (CUSTOM PROCEDURE TRAY) ×3 IMPLANT
TROCAR BLADELESS OPT 5 100 (ENDOMECHANICALS) ×3 IMPLANT
TROCAR XCEL 12X100 BLDLESS (ENDOMECHANICALS) ×3 IMPLANT

## 2019-09-02 NOTE — Op Note (Signed)
Preoperative diagnosis: incisional hernia without obstruction or gangrene  Postoperative diagnosis: Same   Procedure: laparoscopic incisional hernia repair with placement of underlay mesh  Surgeon: Feliciana Rossetti, M.D.  Asst: none  Anesthesia: Gen.   Indications for procedure: Charlene Griffin is a 29 y.o. female with symptoms of abdominal pain and hernia reducible on exam. She had a previous umbilical repair and there is now a recurrence in the supraumbilical space.  Description of procedure: The patient was brought into the operative suite, placed supine. Anesthesia was administered with endotracheal tube. Patient was strapped in place and foot board was secured. Both arms were tucked. All pressure points were offloaded by foam padding. The patient was prepped and draped in the usual sterile fashion.  A small incision was made over the left subcostal area and 64mm trocar was placed with optical entry. Pneumoperitoneum was applied with high flow low pressure. The abdominal cavity was inspected and hernia was seen within the falciform area. 1 4mm trocar was placed in the mid left abdomen and 1 additional 75mm trocar was placed in the LLQ.  Bilateral TAP blocks were placed with Exparel:0.5% Marcaine. Blunt dissection was used to remove most of the filmy adhesions with occasional sharp dissection. Cautery was used to provide hemostasis. The hernia was identified and was filled with preperitoneal fat. It was slowly dissected free and reduced. The falciform ligament was taken down to the subxiphoid area and no additional hernia was seen The defect was about 2 cm in diameter. The defect was repaired with 2 0 PDS sutures via suture passer. A 15 cm round ventralight mesh was inserted and placed beneath the fascia and peritoneum to the repair the mesh. 8 transfascial 0 prolene sutures were used to secure the mesh in place and absorbable tackers were used to appose the mesh against the abdominal wall in  all areas.  The abdominal contents were again inspected and hemostasis was intact. The falciform was tacked up to the mesh.  0 vicryl was used to close the fascial defect of the 7mm trocar site using suture passer. Pneumoperitoneum was removed, all trocar were removed. All incisions were closed with 4-0 monocryl subcuticular stitch. The patient woke from anesthesia and was brought to PACU in stable condition.  Findings: 2 cm incisional hernia in the supraumbilical area  Specimen: none  Blood loss: 5 ml  Local anesthesia:  50 ml Exparel:0.5% Marcaine  Complications: none  Implant: 15 cm round ventralight ST mesh  Images:    Feliciana Rossetti, M.D. General, Bariatric, & Minimally Invasive Surgery [facility]

## 2019-09-02 NOTE — H&P (Signed)
Charlene Griffin is an 30 y.o. female.   Chief Complaint: hernia HPI: 29 yo female with history of hernia repair who presents with additional incisional hernia. She has had worsening abdominal pain over the last 2 months.  Past Medical History:  Diagnosis Date  . ADHD   . Alopecia   . Anxiety   . Bronchitis   . Complication of anesthesia    intubation bronchitis  . GERD (gastroesophageal reflux disease)    rare  . Hemorrhoids   . Insomnia   . Major depression   . Migraine with aura   . Panic attack    with surgery  . PCOS (polycystic ovarian syndrome)   . PONV (postoperative nausea and vomiting)   . Tinnitus   . Umbilical hernia     Past Surgical History:  Procedure Laterality Date  . HERNIA REPAIR     x 2  . LAPAROSCOPY N/A 12/29/2018   Procedure: LAPAROSCOPY DIAGNOSTIC;  Surgeon: Romualdo Bolk, MD;  Location: [facility];  Service: Gynecology;  Laterality: N/A;  1 hour surgery time/ follow Dr Rica Records case  . LEFT OOPHORECTOMY    . OVARIAN CYST REMOVAL Left 12/29/2018   Procedure: LAPARASCOPIC OOPHARECTOMY;  Surgeon: Romualdo Bolk, MD;  Location: [facility];  Service: Gynecology;  Laterality: Left;  . WISDOM TOOTH EXTRACTION      Family History  Problem Relation Age of Onset  . Colon cancer Maternal Grandfather        passed away 05-03-12   Social History:  reports that she has been smoking e-cigarettes. She has never used smokeless tobacco. She reports current alcohol use. She reports that she does not use drugs.  Allergies:  Allergies  Allergen Reactions  . Botox [Botulinum Toxin Type A] Rash and Other (See Comments)    Mood changes     Medications Prior to Admission  Medication Sig Dispense Refill  . etonogestrel (NEXPLANON) 68 MG IMPL implant 68 mg by Subdermal route once.     . Galcanezumab-gnlm (EMGALITY) 120 MG/ML SOAJ Inject 120 mg into the skin every 30 (thirty) days.     . Rimegepant Sulfate (NURTEC) 75 MG  TBDP Take 75 mg by mouth daily as needed (migraines).     . verapamil (CALAN) 40 MG tablet Take 40 mg by mouth as needed.     Marland Kitchen albuterol (VENTOLIN HFA) 108 (90 Base) MCG/ACT inhaler Inhale 1 puff into the lungs every 6 (six) hours as needed for wheezing or shortness of breath.     . lidocaine (XYLOCAINE) 5 % ointment Apply topically 20 minutes prior to intercourse, wipe off just prior to intercourse. 30 g 0    Results for orders placed or performed during the hospital encounter of 09/02/19 (from the past 48 hour(s))  Pregnancy, urine     Status: None   Collection Time: 09/02/19  6:21 AM  Result Value Ref Range   Preg Test, Ur NEGATIVE NEGATIVE    Comment:        THE SENSITIVITY OF THIS METHODOLOGY IS >20 mIU/mL. Performed at [facility], [address] [place], [place] [address]    No results found.  Review of Systems  Constitutional: Negative for chills and fever.  HENT: Negative for hearing loss.   Respiratory: Negative for cough.   Cardiovascular: Negative for chest pain and palpitations.  Gastrointestinal: Negative for abdominal pain, nausea and vomiting.  Genitourinary: Negative for dysuria and urgency.  Musculoskeletal: Negative for myalgias and neck  pain.  Skin: Negative for rash.  Neurological: Negative for dizziness and headaches.  Hematological: Does not bruise/bleed easily.  Psychiatric/Behavioral: Negative for suicidal ideas.    Blood pressure 120/78, pulse 71, temperature 97.6 F (36.4 C), temperature source Oral, resp. rate 18, height 5\' 6"  (1.676 m), weight 76.3 kg, SpO2 100 %. Physical Exam Vitals reviewed.  Constitutional:      Appearance: She is well-developed.  HENT:     Head: Normocephalic and atraumatic.  Eyes:     Conjunctiva/sclera: Conjunctivae normal.     Pupils: Pupils are equal, round, and reactive to light.  Cardiovascular:     Rate and Rhythm: Normal rate and regular rhythm.  Pulmonary:     Effort: Pulmonary effort is  normal.     Breath sounds: Normal breath sounds.  Abdominal:     General: Bowel sounds are normal. There is no distension.     Palpations: Abdomen is soft.     Tenderness: There is no abdominal tenderness.     Comments: Epigastric hernia, multiple well healed laparoscopic sites  Musculoskeletal:        General: Normal range of motion.     Cervical back: Normal range of motion and neck supple.  Skin:    General: Skin is warm and dry.  Neurological:     Mental Status: She is alert and oriented to person, place, and time.  Psychiatric:        Behavior: Behavior normal.      Assessment/Plan 29 yo female with symptomatic incisional hernia -lap incisional hernia repair with mesh -ERAS protocol -outpatient procedure  37, MD 09/02/2019, 8:14 AM

## 2019-09-02 NOTE — Transfer of Care (Signed)
Immediate Anesthesia Transfer of Care Note  Patient: Charlene Griffin  Procedure(s) Performed: LAPAROSCOPIC INCISIONAL HERNIA REPAIR WITH MESH (N/A )  Patient Location: PACU  Anesthesia Type:General  Level of Consciousness: awake, drowsy and patient cooperative  Airway & Oxygen Therapy: Patient Spontanous Breathing and Patient connected to face mask oxygen  Post-op Assessment: Report given to RN and Post -op Vital signs reviewed and stable  Post vital signs: Reviewed and stable  Last Vitals:  Vitals Value Taken Time  BP 110/65 09/02/19 1003  Temp    Pulse 71 09/02/19 1005  Resp 14 09/02/19 1005  SpO2 100 % 09/02/19 1005  Vitals shown include unvalidated device data.  Last Pain:  Vitals:   09/02/19 0716  TempSrc: Oral  PainSc:          Complications: No complications documented.

## 2019-09-02 NOTE — Progress Notes (Signed)
Notified by nurse that patient's pain is not controlled. Examined patient who was tearful and complaining of pain in mid abdomen and left abdomen that was very intense. She also said she was slightly light headed on standing. -observe overnight for improved pain control

## 2019-09-02 NOTE — Discharge Instructions (Signed)
[facility] _______[facility] ° °UMBILICAL OR INGUINAL HERNIA REPAIR: POST OP INSTRUCTIONS ° °Always review your discharge instruction sheet given to you by the facility where your surgery was performed. °IF YOU HAVE DISABILITY OR FAMILY LEAVE FORMS, YOU MUST BRING THEM TO THE OFFICE FOR PROCESSING.   °DO NOT GIVE THEM TO YOUR DOCTOR. ° °1. A  prescription for pain medication may be given to you upon discharge.  Take your pain medication as prescribed, if needed.  If narcotic pain medicine is not needed, then you may take acetaminophen (Tylenol) or ibuprofen (Advil) as needed. °2. Take your usually prescribed medications unless otherwise directed. °If you need a refill on your pain medication, please contact your pharmacy.  They will contact our office to request authorization. Prescriptions will not be filled after 5 pm or on week-ends. °3. You should follow a light diet the first 24 hours after arrival home, such as soup and crackers, etc.  Be sure to include lots of fluids daily.  Resume your normal diet the day after surgery. °4.Most patients will experience some swelling and bruising around the umbilicus or in the groin and scrotum.  Ice packs and reclining will help.  Swelling and bruising can take several days to resolve.  °6. It is common to experience some constipation if taking pain medication after surgery.  Increasing fluid intake and taking a stool softener (such as Colace) will usually help or prevent this problem from occurring.  A mild laxative (Milk of Magnesia or Miralax) should be taken according to package directions if there are no bowel movements after 48 hours. °7. Unless discharge instructions indicate otherwise, you may remove your bandages 24-48 hours after surgery, and you may shower at that time.  You may have steri-strips (small skin tapes) in place directly over the incision.  These strips should be left on the skin for 7-10 days.  If your surgeon used skin glue on the  incision, you may shower in 24 hours.  The glue will flake off over the next 2-3 weeks.  Any sutures or staples will be removed at the office during your follow-up visit. °8. ACTIVITIES:  You may resume regular (light) daily activities beginning the next day--such as daily self-care, walking, climbing stairs--gradually increasing activities as tolerated.  You may have sexual intercourse when it is comfortable.  Refrain from any heavy lifting or straining until approved by your doctor. ° °a.You may drive when you are no longer taking prescription pain medication, you can comfortably wear a seatbelt, and you can safely maneuver your car and apply brakes. °b.RETURN TO WORK:   °_____________________________________________ ° °9.You should see your doctor in the office for a follow-up appointment approximately 2-3 weeks after your surgery.  Make sure that you call for this appointment within a day or two after you arrive home to insure a convenient appointment time. °10.OTHER INSTRUCTIONS: _________________________ °   _____________________________________ ° °WHEN TO CALL YOUR DOCTOR: °1. Fever over 101.0 °2. Inability to urinate °3. Nausea and/or vomiting °4. Extreme swelling or bruising °5. Continued bleeding from incision. °6. Increased pain, redness, or drainage from the incision ° °The clinic staff is available to answer your questions during regular business hours.  Please don’t hesitate to call and ask to speak to one of the nurses for clinical concerns.  If you have a medical emergency, go to the nearest emergency room or call 911.  A surgeon from [facility] is always on call at the hospital ° ° °  [address], [address], [place], [place]  [address] ? ° [address] [address], [place],    [address] °[phone] ? [phone] ? FAX [phone] °Web site: [email or web address] °

## 2019-09-02 NOTE — Anesthesia Postprocedure Evaluation (Signed)
Anesthesia Post Note  Patient: Charlene Griffin  Procedure(s) Performed: LAPAROSCOPIC INCISIONAL HERNIA REPAIR WITH MESH (N/A )     Patient location during evaluation: PACU Anesthesia Type: General Level of consciousness: awake and alert and oriented Pain management: pain level controlled Vital Signs Assessment: post-procedure vital signs reviewed and stable Respiratory status: spontaneous breathing, nonlabored ventilation and respiratory function stable Cardiovascular status: blood pressure returned to baseline Postop Assessment: no apparent nausea or vomiting Anesthetic complications: no   No complications documented.  Last Vitals:  Vitals:   09/02/19 1030 09/02/19 1045  BP: (!) 106/58 108/76  Pulse: 65 (!) 59  Resp: 12 12  Temp:  (!) 36.3 C  SpO2: 97% 97%    Last Pain:  Vitals:   09/02/19 1030  TempSrc:   PainSc: 5                  Kaylyn Layer

## 2019-09-02 NOTE — Anesthesia Procedure Notes (Signed)
Procedure Name: Intubation Date/Time: 09/02/2019 8:34 AM Performed by: West Pugh, CRNA Pre-anesthesia Checklist: Patient identified, Emergency Drugs available, Suction available, Patient being monitored and Timeout performed Patient Re-evaluated:Patient Re-evaluated prior to induction Oxygen Delivery Method: Circle system utilized Preoxygenation: Pre-oxygenation with 100% oxygen Induction Type: IV induction Ventilation: Mask ventilation without difficulty Laryngoscope Size: Mac and 3 Grade View: Grade I Tube type: Oral Tube size: 7.0 mm Number of attempts: 1 Airway Equipment and Method: Stylet Placement Confirmation: ETT inserted through vocal cords under direct vision,  positive ETCO2 and breath sounds checked- equal and bilateral Secured at: 20 cm Tube secured with: Tape Dental Injury: Teeth and Oropharynx as per pre-operative assessment

## 2019-09-03 ENCOUNTER — Encounter (HOSPITAL_COMMUNITY): Payer: Self-pay | Admitting: General Surgery

## 2019-09-03 DIAGNOSIS — K432 Incisional hernia without obstruction or gangrene: Secondary | ICD-10-CM | POA: Diagnosis not present

## 2019-09-03 NOTE — Discharge Summary (Signed)
Physician Discharge Summary  Patient ID: JANAY CANAN MRN: [id number] DOB/AGE: 1990/03/20 29 y.o.  Admit date: 09/02/2019 Discharge date: 09/03/2019  Admission Diagnoses:  Discharge Diagnoses:  Active Problems:   Incisional hernia   Discharged Condition: good  Hospital Course: 29 yo female underwent laparoscopic incisional hernia with mesh. Post op she had intense pain and nausea and was observed due to pain issues. POD 1 she was able to tolerate food and pain was manageable and she was discharged home.  Consults: None  Significant Diagnostic Studies: labs:  CBC    Component Value Date/Time   WBC 12.8 (H) 09/02/2019 1354   RBC 4.24 09/02/2019 1354   HGB 13.2 09/02/2019 1354   HGB 14.6 07/24/2019 1445   HGB 14.1 10/22/2018 0909   HCT 38.8 09/02/2019 1354   HCT 43.8 10/22/2018 0909   PLT 270 09/02/2019 1354   PLT 320 07/24/2019 1445   PLT 338 10/22/2018 0909   MCV 91.5 09/02/2019 1354   MCV 94 10/22/2018 0909   MCH 31.1 09/02/2019 1354   MCHC 34.0 09/02/2019 1354   RDW 12.5 09/02/2019 1354   RDW 12.8 10/22/2018 0909   LYMPHSABS 2.1 07/24/2019 1445   MONOABS 0.5 07/24/2019 1445   EOSABS 0.1 07/24/2019 1445   BASOSABS 0.0 07/24/2019 1445     Treatments: incisional hernia repair  Discharge Exam: Blood pressure 98/60, pulse 71, temperature (!) 97.4 F (36.3 C), temperature source Oral, resp. rate 16, height 5\' 6"  (1.676 m), weight 76.3 kg, SpO2 100 %. General appearance: alert and cooperative Head: Normocephalic, without obvious abnormality, atraumatic GI: incisions c/d/i, no erythema, tender to palpation  Disposition: Discharge disposition: 01-Home or Self Care       Discharge Instructions    Call MD for:  difficulty breathing, headache or visual disturbances   Complete by: As directed    Call MD for:  hives   Complete by: As directed    Call MD for:  persistant nausea and vomiting   Complete by: As directed    Call MD for:  redness, tenderness,  or signs of infection (pain, swelling, redness, odor or green/yellow discharge around incision site)   Complete by: As directed    Call MD for:  severe uncontrolled pain   Complete by: As directed    Call MD for:  temperature >100.4   Complete by: As directed    Diet - low sodium heart healthy   Complete by: As directed    Discharge wound care:   Complete by: As directed    Ok to shower tomorrow. Glue will likely peel off in 1-3 weeks. No bandage required   Driving Restrictions   Complete by: As directed    No driving while on narcotics   Increase activity slowly   Complete by: As directed    Lifting restrictions   Complete by: As directed    No lifting greater than 20 pounds for 3 weeks     Allergies as of 09/03/2019      Reactions   Botox [botulinum Toxin Type A] Rash, Other (See Comments)   Mood changes       Medication List    TAKE these medications   albuterol 108 (90 Base) MCG/ACT inhaler Commonly known as: VENTOLIN HFA Inhale 1 puff into the lungs every 6 (six) hours as needed for wheezing or shortness of breath.   Emgality 120 MG/ML Soaj Generic drug: Galcanezumab-gnlm Inject 120 mg into the skin every 30 (thirty) days.   etonogestrel 68  MG Impl implant Commonly known as: NEXPLANON 68 mg by Subdermal route once.   ibuprofen 800 MG tablet Commonly known as: ADVIL Take 1 tablet (800 mg total) by mouth every 8 (eight) hours as needed. Notes to patient: Do not use other NSAID (aspirin, naproxen) medicines unless okayed by your doctor   lidocaine 5 % ointment Commonly known as: XYLOCAINE Apply topically 20 minutes prior to intercourse, wipe off just prior to intercourse.   Nurtec 75 MG Tbdp Generic drug: Rimegepant Sulfate Take 75 mg by mouth daily as needed (migraines).   ondansetron 4 MG disintegrating tablet Commonly known as: Zofran ODT Take 1 tablet (4 mg total) by mouth every 8 (eight) hours as needed for nausea or vomiting.   oxyCODONE 5 MG  immediate release tablet Commonly known as: Oxy IR/ROXICODONE Take 1 tablet (5 mg total) by mouth every 6 (six) hours as needed for severe pain. Notes to patient: May make you dizzy, drowsy, or lightheaded. Do not drive or do anything else that could be dangerous until you know how this medicine affects you.  May cause constipation. Ask your doctor if you should use a laxative to prevent and treat constipation. Drink plenty of liquids to help avoid constipation.   verapamil 40 MG tablet Commonly known as: CALAN Take 40 mg by mouth as needed.            Discharge Care Instructions  (From admission, onward)         Start     Ordered   09/02/19 0000  Discharge wound care:       Comments: Ok to shower tomorrow. Glue will likely peel off in 1-3 weeks. No bandage required   09/02/19 9735          Follow-up Information    Shardea Cwynar, De Blanch, MD In 3 weeks.   Specialty: General Surgery Contact information: [address] [place] [address] [phone]               Signed: De Blanch Davia Smyre 09/03/2019, 11:20 AM

## 2019-09-03 NOTE — Progress Notes (Signed)
Assessment unchanged. Pt verbalized understanding of dc instructions through teach back. No questions. Discharged via foot accompanied by NT.

## 2019-09-06 ENCOUNTER — Emergency Department (HOSPITAL_BASED_OUTPATIENT_CLINIC_OR_DEPARTMENT_OTHER)
Admission: EM | Admit: 2019-09-06 | Discharge: 2019-09-06 | Disposition: A | Discharge status: Home / Self Care | Attending: Emergency Medicine | Admitting: Emergency Medicine

## 2019-09-06 ENCOUNTER — Encounter (HOSPITAL_BASED_OUTPATIENT_CLINIC_OR_DEPARTMENT_OTHER): Payer: Self-pay | Admitting: Emergency Medicine

## 2019-09-06 ENCOUNTER — Emergency Department (HOSPITAL_BASED_OUTPATIENT_CLINIC_OR_DEPARTMENT_OTHER)

## 2019-09-06 ENCOUNTER — Other Ambulatory Visit: Payer: Self-pay

## 2019-09-06 DIAGNOSIS — F909 Attention-deficit hyperactivity disorder, unspecified type: Secondary | ICD-10-CM | POA: Diagnosis not present

## 2019-09-06 DIAGNOSIS — R112 Nausea with vomiting, unspecified: Secondary | ICD-10-CM | POA: Insufficient documentation

## 2019-09-06 DIAGNOSIS — B3731 Acute candidiasis of vulva and vagina: Secondary | ICD-10-CM

## 2019-09-06 DIAGNOSIS — K5909 Other constipation: Secondary | ICD-10-CM | POA: Diagnosis not present

## 2019-09-06 DIAGNOSIS — F17290 Nicotine dependence, other tobacco product, uncomplicated: Secondary | ICD-10-CM | POA: Diagnosis not present

## 2019-09-06 DIAGNOSIS — B373 Candidiasis of vulva and vagina: Secondary | ICD-10-CM | POA: Insufficient documentation

## 2019-09-06 DIAGNOSIS — G8918 Other acute postprocedural pain: Secondary | ICD-10-CM

## 2019-09-06 DIAGNOSIS — K5903 Drug induced constipation: Secondary | ICD-10-CM

## 2019-09-06 DIAGNOSIS — Z79899 Other long term (current) drug therapy: Secondary | ICD-10-CM | POA: Insufficient documentation

## 2019-09-06 DIAGNOSIS — G8928 Other chronic postprocedural pain: Secondary | ICD-10-CM | POA: Diagnosis present

## 2019-09-06 LAB — URINALYSIS, MICROSCOPIC (REFLEX): RBC / HPF: NONE SEEN RBC/hpf (ref 0–5)

## 2019-09-06 LAB — URINALYSIS, ROUTINE W REFLEX MICROSCOPIC
Bilirubin Urine: NEGATIVE
Glucose, UA: NEGATIVE mg/dL
Hgb urine dipstick: NEGATIVE
Ketones, ur: NEGATIVE mg/dL
Nitrite: NEGATIVE
Protein, ur: NEGATIVE mg/dL
Specific Gravity, Urine: 1.015 (ref 1.005–1.030)
pH: 8 (ref 5.0–8.0)

## 2019-09-06 LAB — COMPREHENSIVE METABOLIC PANEL
ALT: 52 U/L — ABNORMAL HIGH (ref 0–44)
AST: 34 U/L (ref 15–41)
Albumin: 4.5 g/dL (ref 3.5–5.0)
Alkaline Phosphatase: 53 U/L (ref 38–126)
Anion gap: 12 (ref 5–15)
BUN: 8 mg/dL (ref 6–20)
CO2: 27 mmol/L (ref 22–32)
Calcium: 9.8 mg/dL (ref 8.9–10.3)
Chloride: 95 mmol/L — ABNORMAL LOW (ref 98–111)
Creatinine, Ser: 0.75 mg/dL (ref 0.44–1.00)
GFR calc Af Amer: >60 mL/min (ref >60)
GFR calc non Af Amer: >60 mL/min (ref >60)
Glucose, Bld: 118 mg/dL — ABNORMAL HIGH (ref 70–99)
Potassium: 4.3 mmol/L (ref 3.5–5.1)
Sodium: 134 mmol/L — ABNORMAL LOW (ref 135–145)
Total Bilirubin: 0.7 mg/dL (ref 0.3–1.2)
Total Protein: 8.6 g/dL — ABNORMAL HIGH (ref 6.5–8.1)

## 2019-09-06 LAB — CBC
HCT: 40.7 % (ref 36.0–46.0)
Hemoglobin: 14.1 g/dL (ref 12.0–15.0)
MCH: 30.9 pg (ref 26.0–34.0)
MCHC: 34.6 g/dL (ref 30.0–36.0)
MCV: 89.1 fL (ref 80.0–100.0)
Platelets: 357 10*3/uL (ref 150–400)
RBC: 4.57 MIL/uL (ref 3.87–5.11)
RDW: 11.7 % (ref 11.5–15.5)
WBC: 7.6 10*3/uL (ref 4.0–10.5)
nRBC: 0 % (ref 0.0–0.2)

## 2019-09-06 LAB — PREGNANCY, URINE: Preg Test, Ur: NEGATIVE

## 2019-09-06 LAB — LIPASE, BLOOD: Lipase: 20 U/L (ref 11–51)

## 2019-09-06 MED ORDER — PROMETHAZINE HCL 25 MG/ML IJ SOLN
25.0000 mg | Freq: Once | INTRAMUSCULAR | Status: AC
  Administered 2019-09-06: 25 mg via INTRAVENOUS
  Filled 2019-09-06: qty 1

## 2019-09-06 MED ORDER — SODIUM CHLORIDE 0.9 % IV BOLUS
1000.0000 mL | Freq: Once | INTRAVENOUS | Status: AC
  Administered 2019-09-06: 1000 mL via INTRAVENOUS

## 2019-09-06 MED ORDER — FLEET ENEMA 7-19 GM/118ML RE ENEM
1.0000 | ENEMA | Freq: Once | RECTAL | Status: AC
  Administered 2019-09-06: 1 via RECTAL
  Filled 2019-09-06: qty 1

## 2019-09-06 MED ORDER — MORPHINE SULFATE (PF) 4 MG/ML IV SOLN
4.0000 mg | Freq: Once | INTRAVENOUS | Status: AC
  Administered 2019-09-06: 4 mg via INTRAVENOUS
  Filled 2019-09-06: qty 1

## 2019-09-06 MED ORDER — PROMETHAZINE HCL 25 MG PO TABS: 25.0000 mg | ORAL_TABLET | Freq: Four times a day (QID) | ORAL | 0 refills | Status: DC | PRN

## 2019-09-06 MED ORDER — FLUCONAZOLE 150 MG PO TABS
150.0000 mg | ORAL_TABLET | Freq: Once | ORAL | Status: AC
  Administered 2019-09-06: 150 mg via ORAL
  Filled 2019-09-06: qty 1

## 2019-09-06 NOTE — Discharge Instructions (Signed)
Increase miralax to twice a day if taking it once a day does not relieve constipation.

## 2019-09-06 NOTE — ED Notes (Signed)
Pt on bedside commode, verbalizes using call bell if any needs arise.

## 2019-09-06 NOTE — ED Provider Notes (Signed)
[facility] EMERGENCY DEPARTMENT Provider Note   CSN: [id number] Arrival date & time: 09/06/19  1703     History Chief Complaint  Patient presents with  . Post-op Problem    Charlene Griffin is a 29 y.o. female.  Pt presents to the ED today with n/v and constipation.  The pt had an umbilical hernia repair with mesh placement on 8/25.  She was kept overnight due to pain and vomiting issues.  Pt said she has continued to be very nauseous and the phenergan is not helping.  She has also been severely constipated and has been trying miralax without improvement in sx.  Pt is also worried she has a vaginal yeast infection as she's had "cottage cheese like d/c."        Past Medical History:  Diagnosis Date  . ADHD   . Alopecia   . Anxiety   . Bronchitis   . Complication of anesthesia    intubation bronchitis  . GERD (gastroesophageal reflux disease)    rare  . Hemorrhoids   . Insomnia   . Major depression   . Migraine with aura   . Panic attack    with surgery  . PCOS (polycystic ovarian syndrome)   . PONV (postoperative nausea and vomiting)   . Tinnitus   . Umbilical hernia     Patient Active Problem List   Diagnosis Date Noted  . Incisional hernia 09/02/2019  . Migraine with aura   . Non-seasonal allergic rhinitis 04/04/2018  . Tinnitus of both ears 04/04/2018  . Alopecia areata 02/08/2017  . Enlarged thyroid gland 02/08/2017  . Anxiety 12/11/2015  . Attention deficit hyperactivity disorder (ADHD) 12/11/2015    Past Surgical History:  Procedure Laterality Date  . HERNIA REPAIR     x 2  . INCISIONAL HERNIA REPAIR N/A 09/02/2019   Procedure: LAPAROSCOPIC INCISIONAL HERNIA REPAIR WITH MESH;  Surgeon: Kinsinger, De Blanch, MD;  Location: [facility];  Service: General;  Laterality: N/A;  . LAPAROSCOPY N/A 12/29/2018   Procedure: LAPAROSCOPY DIAGNOSTIC;  Surgeon: Romualdo Bolk, MD;  Location: [facility];  Service: Gynecology;   Laterality: N/A;  1 hour surgery time/ follow Dr Rica Records case  . LEFT OOPHORECTOMY    . OVARIAN CYST REMOVAL Left 12/29/2018   Procedure: LAPARASCOPIC OOPHARECTOMY;  Surgeon: Romualdo Bolk, MD;  Location: [facility];  Service: Gynecology;  Laterality: Left;  . WISDOM TOOTH EXTRACTION       OB History    Gravida  0   Para  0   Term  0   Preterm  0   AB  0   Living  0     SAB  0   TAB  0   Ectopic  0   Multiple  0   Live Births  0           Family History  Problem Relation Age of Onset  . Colon cancer Maternal Grandfather        passed away Apr 27, 2012    Social History   Tobacco Use  . Smoking status: Current Some Day Smoker    Types: E-cigarettes  . Smokeless tobacco: Never Used  Vaping Use  . Vaping Use: Every day  . Substances: Nicotine, Flavoring  Substance Use Topics  . Alcohol use: Yes    Comment: socially  . Drug use: Never    Home Medications Prior to Admission medications   Medication Sig Start Date End Date Taking?  Authorizing Provider  albuterol (VENTOLIN HFA) 108 (90 Base) MCG/ACT inhaler Inhale 1 puff into the lungs every 6 (six) hours as needed for wheezing or shortness of breath.  02/18/19   [provider]  etonogestrel (NEXPLANON) 68 MG IMPL implant 68 mg by Subdermal route once.  09/15/18   [provider]  Galcanezumab-gnlm (EMGALITY) 120 MG/ML SOAJ Inject 120 mg into the skin every 30 (thirty) days.     [provider]  ibuprofen (ADVIL) 800 MG tablet Take 1 tablet (800 mg total) by mouth every 8 (eight) hours as needed. 09/02/19   Kinsinger, De Blanch, MD  lidocaine (XYLOCAINE) 5 % ointment Apply topically 20 minutes prior to intercourse, wipe off just prior to intercourse. 12/03/18   Romualdo Bolk, MD  ondansetron (ZOFRAN ODT) 4 MG disintegrating tablet Take 1 tablet (4 mg total) by mouth every 8 (eight) hours as needed for nausea or vomiting. 09/02/19   Kinsinger, De Blanch, MD   oxyCODONE (OXY IR/ROXICODONE) 5 MG immediate release tablet Take 1 tablet (5 mg total) by mouth every 6 (six) hours as needed for severe pain. 09/02/19   Kinsinger, De Blanch, MD  promethazine (PHENERGAN) 25 MG tablet Take 1 tablet (25 mg total) by mouth every 6 (six) hours as needed for nausea or vomiting. 09/06/19   Jacalyn Lefevre, MD  Rimegepant Sulfate (NURTEC) 75 MG TBDP Take 75 mg by mouth daily as needed (migraines).     [provider]  verapamil (CALAN) 40 MG tablet Take 40 mg by mouth as needed.  09/19/18   [provider]    Allergies    Botox [botulinum toxin type a]  Review of Systems   Review of Systems  Gastrointestinal: Positive for abdominal pain, nausea and vomiting.  Genitourinary: Positive for vaginal discharge.  All other systems reviewed and are negative.   Physical Exam Updated Vital Signs BP 103/60 (BP Location: Right Arm)   Pulse 69   Temp 98.2 F (36.8 C) (Oral)   Resp 20   Ht 5\' 6"  (1.676 m)   Wt 77.1 kg   SpO2 99%   BMI 27.44 kg/m   Physical Exam Vitals and nursing note reviewed.  Constitutional:      Appearance: Normal appearance.  HENT:     Head: Normocephalic and atraumatic.     Right Ear: External ear normal.     Left Ear: External ear normal.     Nose: Nose normal.     Mouth/Throat:     Mouth: Mucous membranes are dry.  Eyes:     Extraocular Movements: Extraocular movements intact.     Conjunctiva/sclera: Conjunctivae normal.     Pupils: Pupils are equal, round, and reactive to light.  Cardiovascular:     Rate and Rhythm: Normal rate and regular rhythm.  Abdominal:     General: Abdomen is flat. Bowel sounds are decreased.     Tenderness: There is generalized abdominal tenderness.     Comments: Healing surgical scars  Musculoskeletal:        General: Normal range of motion.  Skin:    General: Skin is warm.     Capillary Refill: Capillary refill takes less than 2 seconds.  Neurological:     General: No focal  deficit present.     Mental Status: She is alert and oriented to person, place, and time.  Psychiatric:        Mood and Affect: Mood normal.        Behavior: Behavior normal.  Thought Content: Thought content normal.        Judgment: Judgment normal.     ED Results / Procedures / Treatments   Labs (all labs ordered are listed, but only abnormal results are displayed) Labs Reviewed  COMPREHENSIVE METABOLIC PANEL - Abnormal; Notable for the following components:      Result Value   Sodium 134 (*)    Chloride 95 (*)    Glucose, Bld 118 (*)    Total Protein 8.6 (*)    ALT 52 (*)    All other components within normal limits  URINALYSIS, ROUTINE W REFLEX MICROSCOPIC - Abnormal; Notable for the following components:   Leukocytes,Ua SMALL (*)    All other components within normal limits  URINALYSIS, MICROSCOPIC (REFLEX) - Abnormal; Notable for the following components:   Bacteria, UA RARE (*)    All other components within normal limits  LIPASE, BLOOD  CBC  PREGNANCY, URINE    EKG EKG Interpretation  Date/Time:  Sunday September 06 2019 17:32:03 EDT Ventricular Rate:  79 PR Interval:  142 QRS Duration: 84 QT Interval:  346 QTC Calculation: 396 R Axis:   89 Text Interpretation: Normal sinus rhythm Normal ECG No old tracing to compare Confirmed by Jacalyn LefevreHaviland, Nuchem Grattan [phone]) on 09/06/2019 5:37:40 PM   Radiology DG Abdomen Acute W/Chest  Result Date: 09/06/2019 CLINICAL DATA:  Hernia surg 4 days ago. Abd pain, n/v since then. Constipation x 3 days. EXAM: DG ABDOMEN ACUTE W/ 1V CHEST COMPARISON:  CT abdomen pelvis 03/27/2019 FINDINGS: There is no evidence of dilated bowel loops or free intraperitoneal air. No radiopaque calculi. Heavy stool burden. No acute finding in the visualized skeleton. Heart size and mediastinal contours are within normal limits. Both lungs are clear. IMPRESSION: 1.  No acute cardiopulmonary process. 2.  Nonobstructive bowel gas pattern. Heavy stool burden.  Electronically Signed   By: Emmaline KluverNancy  Ballantyne M.D.   On: 09/06/2019 19:08    Procedures Procedures (including critical care time)  Medications Ordered in ED Medications  sodium chloride 0.9 % bolus 1,000 mL (1,000 mLs Intravenous New Bag/Given 09/06/19 2105)  promethazine (PHENERGAN) injection 25 mg (25 mg Intravenous Given 09/06/19 2109)  morphine 4 MG/ML injection 4 mg (4 mg Intravenous Given 09/06/19 2109)  sodium phosphate (FLEET) 7-19 GM/118ML enema 1 enema (1 enema Rectal Given 09/06/19 2110)  fluconazole (DIFLUCAN) tablet 150 mg (150 mg Oral Given 09/06/19 2052)    ED Course  I have reviewed the triage vital signs and the nursing notes.  Pertinent labs & imaging results that were available during my care of the patient were reviewed by me and considered in my medical decision making (see chart for details).    MDM Rules/Calculators/A&P                          Yeast in urine likely from vagina.  She is given diflucan.  Pt given a fleet's enema and had good results.  She is feeling much better after fluids and meds.  She is able to tolerate po fluids.  Pt encouraged to eat a high fiber diet.  She is to drink lots of fluids.  Return if worse.  F/u with pcp/surgeon.  Final Clinical Impression(s) / ED Diagnoses Final diagnoses:  Vaginal candidiasis  Drug-induced constipation  Post-operative pain    Rx / DC Orders ED Discharge Orders         Ordered    promethazine (PHENERGAN) 25 MG tablet  Every  6 hours PRN        09/06/19 2302           Jacalyn Lefevre, MD 09/06/19 2307

## 2019-09-06 NOTE — ED Triage Notes (Signed)
Reports having an umbilical hernia repair on the 26th.  Reports having no bm since day of surgery.  Now has been vomiting atleast once a day and having dry heaves.  Had oxycodone 10 and tylenol at 10 am.  Had zofran and ibuprofen an hour prior to that.

## 2019-09-06 NOTE — ED Notes (Signed)
Pt states she did have a BM but would also like EDP to examine her R eye due to redness and pain.

## 2019-10-02 ENCOUNTER — Other Ambulatory Visit: Payer: Self-pay

## 2019-10-02 ENCOUNTER — Emergency Department (HOSPITAL_BASED_OUTPATIENT_CLINIC_OR_DEPARTMENT_OTHER)
Admission: EM | Admit: 2019-10-02 | Discharge: 2019-10-02 | Disposition: A | Discharge status: Home / Self Care | Attending: Emergency Medicine | Admitting: Emergency Medicine

## 2019-10-02 ENCOUNTER — Emergency Department (HOSPITAL_BASED_OUTPATIENT_CLINIC_OR_DEPARTMENT_OTHER)

## 2019-10-02 ENCOUNTER — Encounter (HOSPITAL_BASED_OUTPATIENT_CLINIC_OR_DEPARTMENT_OTHER): Payer: Self-pay | Admitting: *Deleted

## 2019-10-02 DIAGNOSIS — G43809 Other migraine, not intractable, without status migrainosus: Secondary | ICD-10-CM | POA: Diagnosis not present

## 2019-10-02 DIAGNOSIS — G43909 Migraine, unspecified, not intractable, without status migrainosus: Secondary | ICD-10-CM | POA: Diagnosis present

## 2019-10-02 DIAGNOSIS — F17210 Nicotine dependence, cigarettes, uncomplicated: Secondary | ICD-10-CM | POA: Insufficient documentation

## 2019-10-02 DIAGNOSIS — Z79899 Other long term (current) drug therapy: Secondary | ICD-10-CM | POA: Diagnosis not present

## 2019-10-02 LAB — PREGNANCY, URINE: Preg Test, Ur: NEGATIVE

## 2019-10-02 MED ORDER — METOCLOPRAMIDE HCL 5 MG/ML IJ SOLN
10.0000 mg | Freq: Once | INTRAMUSCULAR | Status: AC
  Administered 2019-10-02: 10 mg via INTRAVENOUS
  Filled 2019-10-02: qty 2

## 2019-10-02 MED ORDER — SODIUM CHLORIDE 0.9 % IV BOLUS
1000.0000 mL | Freq: Once | INTRAVENOUS | Status: AC
  Administered 2019-10-02: 1000 mL via INTRAVENOUS

## 2019-10-02 MED ORDER — KETOROLAC TROMETHAMINE 30 MG/ML IJ SOLN
30.0000 mg | Freq: Once | INTRAMUSCULAR | Status: AC
  Administered 2019-10-02: 30 mg via INTRAVENOUS
  Filled 2019-10-02: qty 1

## 2019-10-02 NOTE — ED Provider Notes (Signed)
[facility] EMERGENCY DEPARTMENT Provider Note  CSN: [id number] Arrival date & time: 10/02/19 1541    History Chief Complaint  Patient presents with  . Migraine    HPI  Charlene Griffin is a 29 y.o. female with history of chronic migraines managed by neurology with multiple medications reports onset of a typical migraine yesterday that responded well to her usual medications. She reports around lunch today however she had loss of vision in her R visual field and return of L sided migrainous headache. She reports the visual change resolved after about 2 hours but she continued to have left sided headache. She denies any other new headache related symptoms but does report an unusual sensation behind her R knee/thigh that she describes as 'like a pulled muscle'. No numbness, tingling or weakness in extremities.    Past Medical History:  Diagnosis Date  . ADHD   . Alopecia   . Anxiety   . Bronchitis   . Complication of anesthesia    intubation bronchitis  . GERD (gastroesophageal reflux disease)    rare  . Hemorrhoids   . Insomnia   . Major depression   . Migraine with aura   . Panic attack    with surgery  . PCOS (polycystic ovarian syndrome)   . PONV (postoperative nausea and vomiting)   . Tinnitus   . Umbilical hernia     Past Surgical History:  Procedure Laterality Date  . HERNIA REPAIR     x 2  . INCISIONAL HERNIA REPAIR N/A 09/02/2019   Procedure: LAPAROSCOPIC INCISIONAL HERNIA REPAIR WITH MESH;  Surgeon: Kinsinger, De Blanch, MD;  Location: [facility];  Service: General;  Laterality: N/A;  . LAPAROSCOPY N/A 12/29/2018   Procedure: LAPAROSCOPY DIAGNOSTIC;  Surgeon: Romualdo Bolk, MD;  Location: [facility];  Service: Gynecology;  Laterality: N/A;  1 hour surgery time/ follow Dr Rica Records case  . LEFT OOPHORECTOMY    . OVARIAN CYST REMOVAL Left 12/29/2018   Procedure: LAPARASCOPIC OOPHARECTOMY;  Surgeon: Romualdo Bolk, MD;   Location: [facility];  Service: Gynecology;  Laterality: Left;  . WISDOM TOOTH EXTRACTION      Family History  Problem Relation Age of Onset  . Colon cancer Maternal Grandfather        passed away 04-27-2012    Social History   Tobacco Use  . Smoking status: Current Some Day Smoker    Types: E-cigarettes  . Smokeless tobacco: Never Used  Vaping Use  . Vaping Use: Every day  . Substances: Nicotine, Flavoring  Substance Use Topics  . Alcohol use: Yes    Comment: socially  . Drug use: Never     Home Medications Prior to Admission medications   Medication Sig Start Date End Date Taking? Authorizing Provider  albuterol (VENTOLIN HFA) 108 (90 Base) MCG/ACT inhaler Inhale 1 puff into the lungs every 6 (six) hours as needed for wheezing or shortness of breath.  02/18/19   [provider]  etonogestrel (NEXPLANON) 68 MG IMPL implant 68 mg by Subdermal route once.  09/15/18   [provider]  Galcanezumab-gnlm (EMGALITY) 120 MG/ML SOAJ Inject 120 mg into the skin every 30 (thirty) days.     [provider]  ibuprofen (ADVIL) 800 MG tablet Take 1 tablet (800 mg total) by mouth every 8 (eight) hours as needed. 09/02/19   Kinsinger, De Blanch, MD  lidocaine (XYLOCAINE) 5 % ointment Apply topically 20 minutes prior to intercourse, wipe off  just prior to intercourse. 12/03/18   Romualdo Bolk, MD  ondansetron (ZOFRAN ODT) 4 MG disintegrating tablet Take 1 tablet (4 mg total) by mouth every 8 (eight) hours as needed for nausea or vomiting. 09/02/19   Kinsinger, De Blanch, MD  oxyCODONE (OXY IR/ROXICODONE) 5 MG immediate release tablet Take 1 tablet (5 mg total) by mouth every 6 (six) hours as needed for severe pain. 09/02/19   Kinsinger, De Blanch, MD  promethazine (PHENERGAN) 25 MG tablet Take 1 tablet (25 mg total) by mouth every 6 (six) hours as needed for nausea or vomiting. 09/06/19   Jacalyn Lefevre, MD  Rimegepant Sulfate (NURTEC) 75 MG TBDP Take 75  mg by mouth daily as needed (migraines).     [provider]  verapamil (CALAN) 40 MG tablet Take 40 mg by mouth as needed.  09/19/18   [provider]     Allergies    Botox [botulinum toxin type a]   Review of Systems   Review of Systems A comprehensive review of systems was completed and negative except as noted in HPI.    Physical Exam BP 118/74   Pulse 84   Temp 97.9 F (36.6 C) (Oral)   Resp 18   Ht 5\' 6"  (1.676 m)   SpO2 100%   BMI 27.44 kg/m   Physical Exam Vitals and nursing note reviewed.  Constitutional:      Appearance: Normal appearance.  HENT:     Head: Normocephalic and atraumatic.     Nose: Nose normal.     Mouth/Throat:     Mouth: Mucous membranes are moist.  Eyes:     Extraocular Movements: Extraocular movements intact.     Conjunctiva/sclera: Conjunctivae normal.  Cardiovascular:     Rate and Rhythm: Normal rate.  Pulmonary:     Effort: Pulmonary effort is normal.     Breath sounds: Normal breath sounds.  Abdominal:     General: Abdomen is flat.     Palpations: Abdomen is soft.     Tenderness: There is no abdominal tenderness.  Musculoskeletal:        General: No swelling. Normal range of motion.     Cervical back: Neck supple.  Skin:    General: Skin is warm and dry.  Neurological:     General: No focal deficit present.     Mental Status: She is alert.     Cranial Nerves: No cranial nerve deficit.     Sensory: No sensory deficit.     Motor: No weakness.     Coordination: Coordination normal.     Comments: Normal visual fields  Psychiatric:        Mood and Affect: Mood normal.      ED Results / Procedures / Treatments   Labs (all labs ordered are listed, but only abnormal results are displayed) Labs Reviewed  PREGNANCY, URINE    EKG None   Radiology CT Head Wo Contrast  Result Date: 10/02/2019 CLINICAL DATA:  Headache, intracranial hemorrhage suspected. Additional history provided: Patient reports  migraine for 10 hours, right eye blurred vision, right eye pain. EXAM: CT HEAD WITHOUT CONTRAST TECHNIQUE: Contiguous axial images were obtained from the base of the skull through the vertex without intravenous contrast. COMPARISON:  No pertinent prior exams are available for comparison. FINDINGS: Brain: Cerebral volume is normal. There is no acute intracranial hemorrhage. No demarcated cortical infarct. No extra-axial fluid collection. No evidence of intracranial mass. No midline shift. Vascular: No hyperdense vessel. Skull: Normal.  Negative for fracture or focal lesion. Sinuses/Orbits: Visualized orbits show no acute finding. No significant paranasal sinus disease or mastoid effusion at the imaged levels. IMPRESSION: Unremarkable non-contrast CT appearance of the brain. No evidence of acute intracranial abnormality. Electronically Signed   By: Jackey Loge DO   On: 10/02/2019 19:08    Procedures Procedures  Medications Ordered in the ED Medications  sodium chloride 0.9 % bolus 1,000 mL (0 mLs Intravenous Stopped 10/02/19 1838)  ketorolac (TORADOL) 30 MG/ML injection 30 mg (30 mg Intravenous Given 10/02/19 1833)  metoCLOPramide (REGLAN) injection 10 mg (10 mg Intravenous Given 10/02/19 1832)     MDM Rules/Calculators/A&P MDM Patient reports she contacted her neurologist's office who recommended she come to the ED for evaluation due to new symptoms of vision loss not typical for her chronic migraines. That particular symptom has improved, still having migraine headache on the left. Will send for CT, plan migraine cocktail pending neg urine pregnancy test.  ED Course  I have reviewed the triage vital signs and the nursing notes.  Pertinent labs & imaging results that were available during my care of the patient were reviewed by me and considered in my medical decision making (see chart for details).  Clinical Course as of Oct 01 1937  Fri Oct 02, 2019  5361 Patient reports significant improvement  in headache. CT neg, no further visual disturbances since arrival and patient is eager to go home. Recommend outpatient neurology follow up. RTED for any other concerns.    [CS]    Clinical Course User Index [CS] Pollyann Savoy, MD    Final Clinical Impression(s) / ED Diagnoses Final diagnoses:  Other migraine without status migrainosus, not intractable    Rx / DC Orders ED Discharge Orders    None       Pollyann Savoy, MD 10/02/19 1939

## 2019-10-02 NOTE — ED Triage Notes (Signed)
C/o " migraine " x 10 hrs, right eye blurred vision lasting 2 hrs .

## 2019-10-02 NOTE — ED Notes (Signed)
Pt. Report eye pain in R eye after having migraine today.  Pt. In no distress and is clear with her speech and has normal NIHSS.  Pt. Walks steady gait to restroom

## 2020-01-19 ENCOUNTER — Other Ambulatory Visit: Payer: Self-pay

## 2020-01-19 ENCOUNTER — Encounter: Payer: Self-pay | Admitting: Emergency Medicine

## 2020-01-19 ENCOUNTER — Emergency Department (INDEPENDENT_AMBULATORY_CARE_PROVIDER_SITE_OTHER)
Admission: EM | Admit: 2020-01-19 | Discharge: 2020-01-19 | Disposition: A | Discharge status: Home / Self Care | Source: Home / Self Care

## 2020-01-19 DIAGNOSIS — R52 Pain, unspecified: Secondary | ICD-10-CM

## 2020-01-19 DIAGNOSIS — R197 Diarrhea, unspecified: Secondary | ICD-10-CM

## 2020-01-19 DIAGNOSIS — R112 Nausea with vomiting, unspecified: Secondary | ICD-10-CM

## 2020-01-19 DIAGNOSIS — Z9189 Other specified personal risk factors, not elsewhere classified: Secondary | ICD-10-CM

## 2020-01-19 MED ORDER — FAMOTIDINE 20 MG PO TABS: 20.0000 mg | ORAL_TABLET | Freq: Two times a day (BID) | ORAL | 0 refills | Status: DC | PRN

## 2020-01-19 MED ORDER — ONDANSETRON HCL 4 MG PO TABS: 4.0000 mg | ORAL_TABLET | Freq: Three times a day (TID) | ORAL | 0 refills | Status: DC | PRN

## 2020-01-19 MED ORDER — BENZONATATE 100 MG PO CAPS: 100.0000 mg | ORAL_CAPSULE | Freq: Three times a day (TID) | ORAL | 0 refills | Status: DC | PRN

## 2020-01-19 MED ORDER — ALBUTEROL SULFATE (2.5 MG/3ML) 0.083% IN NEBU: 2.5000 mg | INHALATION_SOLUTION | Freq: Four times a day (QID) | RESPIRATORY_TRACT | 12 refills | Status: DC | PRN

## 2020-01-19 MED ORDER — ONDANSETRON 4 MG PO TBDP
4.0000 mg | ORAL_TABLET | Freq: Once | ORAL | Status: AC
  Administered 2020-01-19: 4 mg via ORAL

## 2020-01-19 NOTE — ED Triage Notes (Addendum)
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

## 2020-01-19 NOTE — ED Provider Notes (Signed)
[facility]    CSN: [id number] Arrival date & time: 01/19/20  0844      History   Chief Complaint No chief complaint on file.   HPI Charlene Griffin is a 30 y.o. female.   HPI Patient presents today with 1 day episode of GI upset, diarrhea, generalized body aches and nausea.  Patient reports eating food at a restaurant last evening that did not taste appropriate and subsequently developed GI upset along with diarrhea.  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  She subsequently has developed sore throat, cough and congestion with an abrupt onset since this morning.  She is afebrile.  She has not taken any other medication.  Last episode of vomitus was around 7 AM today. Past Medical History:  Diagnosis Date  . ADHD   . Alopecia   . Anxiety   . Bronchitis   . Complication of anesthesia    intubation bronchitis  . GERD (gastroesophageal reflux disease)    rare  . Hemorrhoids   . Insomnia   . Major depression   . Migraine with aura   . Panic attack    with surgery  . PCOS (polycystic ovarian syndrome)   . PONV (postoperative nausea and vomiting)   . Tinnitus   . Umbilical hernia     Patient Active Problem List   Diagnosis Date Noted  . Incisional hernia 09/02/2019  . Migraine with aura   . Non-seasonal allergic rhinitis 04/04/2018  . Tinnitus of both ears 04/04/2018  . Alopecia areata 02/08/2017  . Enlarged thyroid gland 02/08/2017  . Anxiety 12/11/2015  . Attention deficit hyperactivity disorder (ADHD) 12/11/2015    Past Surgical History:  Procedure Laterality Date  . HERNIA REPAIR     x 2  . INCISIONAL HERNIA REPAIR N/A 09/02/2019   Procedure: LAPAROSCOPIC INCISIONAL HERNIA REPAIR WITH MESH;  Surgeon: Kinsinger, De Blanch, MD;  Location: [facility];  Service: General;  Laterality: N/A;  . LAPAROSCOPY N/A 12/29/2018   Procedure: LAPAROSCOPY DIAGNOSTIC;  Surgeon: Romualdo Bolk, MD;  Location:  [facility];  Service: Gynecology;  Laterality: N/A;  1 hour surgery time/ follow Dr Rica Records case  . LEFT OOPHORECTOMY    . OVARIAN CYST REMOVAL Left 12/29/2018   Procedure: LAPARASCOPIC OOPHARECTOMY;  Surgeon: Romualdo Bolk, MD;  Location: [facility];  Service: Gynecology;  Laterality: Left;  . WISDOM TOOTH EXTRACTION      OB History    Gravida  0   Para  0   Term  0   Preterm  0   AB  0   Living  0     SAB  0   IAB  0   Ectopic  0   Multiple  0   Live Births  0            Home Medications    Prior to Admission medications   Medication Sig Start Date End Date Taking? Authorizing Provider  albuterol (VENTOLIN HFA) 108 (90 Base) MCG/ACT inhaler Inhale 1 puff into the lungs every 6 (six) hours as needed for wheezing or shortness of breath.  02/18/19   [provider]  etonogestrel (NEXPLANON) 68 MG IMPL implant 68 mg by Subdermal route once.  09/15/18   [provider]  Galcanezumab-gnlm (EMGALITY) 120 MG/ML SOAJ Inject 120 mg into the skin every 30 (thirty) days.     [provider]  ibuprofen (ADVIL) 800  MG tablet Take 1 tablet (800 mg total) by mouth every 8 (eight) hours as needed. 09/02/19   Kinsinger, De Blanch, MD  lidocaine (XYLOCAINE) 5 % ointment Apply topically 20 minutes prior to intercourse, wipe off just prior to intercourse. 12/03/18   Romualdo Bolk, MD  ondansetron (ZOFRAN ODT) 4 MG disintegrating tablet Take 1 tablet (4 mg total) by mouth every 8 (eight) hours as needed for nausea or vomiting. 09/02/19   Kinsinger, De Blanch, MD  oxyCODONE (OXY IR/ROXICODONE) 5 MG immediate release tablet Take 1 tablet (5 mg total) by mouth every 6 (six) hours as needed for severe pain. 09/02/19   Kinsinger, De Blanch, MD  promethazine (PHENERGAN) 25 MG tablet Take 1 tablet (25 mg total) by mouth every 6 (six) hours as needed for nausea or vomiting. 09/06/19   Jacalyn Lefevre, MD  Rimegepant Sulfate  (NURTEC) 75 MG TBDP Take 75 mg by mouth daily as needed (migraines).     [provider]  verapamil (CALAN) 40 MG tablet Take 40 mg by mouth as needed.  09/19/18   [provider]    Family History Family History  Problem Relation Age of Onset  . Colon cancer Maternal Grandfather        passed away 2012-05-09    Social History Social History   Tobacco Use  . Smoking status: Current Some Day Smoker    Types: E-cigarettes  . Smokeless tobacco: Never Used  Vaping Use  . Vaping Use: Every day  . Substances: Nicotine, Flavoring  Substance Use Topics  . Alcohol use: Yes    Comment: socially  . Drug use: Never     Allergies   Botox [botulinum toxin type a]  Review of Systems Review of Systems Pertinent negatives listed in HPI   Physical Exam Triage Vital Signs ED Triage Vitals  Enc Vitals Group     BP      Pulse      Resp      Temp      Temp src      SpO2      Weight      Height      Head Circumference      Peak Flow      Pain Score      Pain Loc      Pain Edu?      Excl. in GC?    No data found.  Updated Vital Signs There were no vitals taken for this visit.  Visual Acuity Right Eye Distance:   Left Eye Distance:   Bilateral Distance:    Right Eye Near:   Left Eye Near:    Bilateral Near:     Physical Exam General appearance: Alert, ill appearing, obese, cooperative and in no distress Head: Normocephalic, without obvious abnormality, atraumatic ENT: nasal congestion, oropharynx w/o exudate or erythema  Respiratory: Respirations even and unlabored, normal respiratory rate Heart: Rate and rhythm normal. No gallop or murmurs noted on exam  Abdomen: BS +, no distention, no rebound tenderness, or no mass Extremities: No gross deformities Skin: Skin color, texture, turgor normal. No rashes seen  Psych: Appropriate mood and affect. Neurologic: Alert, oriented to person, place, and time, thought content appropriate. UC Treatments / Results   Labs (all labs ordered are listed, but only abnormal results are displayed) Labs Reviewed - No data to display  EKG   Radiology No results found.  Procedures Procedures (including critical care time)  Medications Ordered in UC Medications -  No data to display  Initial Impression / Assessment and Plan / UC Course  I have reviewed the triage vital signs and the nursing notes.  Pertinent labs & imaging results that were available during my care of the patient were reviewed by me and considered in my medical decision making (see chart for details).    COVID/Flu test pending. Symptom management warranted only.  Manage fever with Tylenol and ibuprofen.  Nasal symptoms with over-the-counter antihistamines recommended.  Treatment per discharge medications/discharge instructions.  Red flags/ER precautions given. The most current CDC isolation/quarantine recommendation advised.   Final Clinical Impressions(s) / UC Diagnoses   Final diagnoses:  At increased risk of exposure to COVID-19 virus  Generalized body aches  Nausea vomiting and diarrhea     Discharge Instructions     Your COVID 19 results should result within 3-5 days. Negative results are immediately resulted to Mychart. Positive results will receive a follow-up call from our clinic. If symptoms are present, I recommend home quarantine until results are known.  I have sent over the albuterol for your nebulizer machine you may use as needed if you develop any wheezing, shortness of breath or cyclic cough that does not improve with anticough medication. Alternate Tylenol and ibuprofen as needed for body aches and fever.  Symptom management per recommendations discussed today.  If any breathing difficulty or chest pain develops go immediately to the closest emergency department for evaluation.     ED Prescriptions    Medication Sig Dispense Auth. Provider   ondansetron (ZOFRAN) 4 MG tablet Take 1-2 tablets (4-8 mg total) by  mouth every 8 (eight) hours as needed for nausea or vomiting. 18 tablet Bing Neighbors, FNP   benzonatate (TESSALON) 100 MG capsule Take 1-2 capsules (100-200 mg total) by mouth 3 (three) times daily as needed for cough. 40 capsule Bing Neighbors, FNP   albuterol (PROVENTIL) (2.5 MG/3ML) 0.083% nebulizer solution Take 3 mLs (2.5 mg total) by nebulization every 6 (six) hours as needed for wheezing or shortness of breath. 75 mL Bing Neighbors, FNP   famotidine (PEPCID) 20 MG tablet Take 1 tablet (20 mg total) by mouth 2 (two) times daily as needed (Gastritis symptoms). 30 tablet Bing Neighbors, FNP     PDMP not reviewed this encounter.   Bing Neighbors, FNP 01/22/20 [phone]

## 2020-01-19 NOTE — Discharge Instructions (Addendum)
Your COVID 19 results should result within 3-5 days. Negative results are immediately resulted to Mychart. Positive results will receive a follow-up call from our clinic. If symptoms are present, I recommend home quarantine until results are known.  I have sent over the albuterol for your nebulizer machine you may use as needed if you develop any wheezing, shortness of breath or cyclic cough that does not improve with anticough medication. Alternate Tylenol and ibuprofen as needed for body aches and fever.  Symptom management per recommendations discussed today.  If any breathing difficulty or chest pain develops go immediately to the closest emergency department for evaluation.

## 2020-01-21 LAB — COVID-19, FLU A+B NAA
Influenza A, NAA: NOT DETECTED
Influenza B, NAA: NOT DETECTED
SARS-CoV-2, NAA: NOT DETECTED

## 2020-02-07 ENCOUNTER — Other Ambulatory Visit: Payer: Self-pay

## 2020-02-07 ENCOUNTER — Emergency Department (HOSPITAL_BASED_OUTPATIENT_CLINIC_OR_DEPARTMENT_OTHER)
Admission: EM | Admit: 2020-02-07 | Discharge: 2020-02-07 | Disposition: A | Discharge status: Home / Self Care | Attending: Emergency Medicine | Admitting: Emergency Medicine

## 2020-02-07 ENCOUNTER — Emergency Department (HOSPITAL_BASED_OUTPATIENT_CLINIC_OR_DEPARTMENT_OTHER)

## 2020-02-07 ENCOUNTER — Encounter (HOSPITAL_BASED_OUTPATIENT_CLINIC_OR_DEPARTMENT_OTHER): Payer: Self-pay | Admitting: Emergency Medicine

## 2020-02-07 DIAGNOSIS — J029 Acute pharyngitis, unspecified: Secondary | ICD-10-CM | POA: Diagnosis present

## 2020-02-07 DIAGNOSIS — R112 Nausea with vomiting, unspecified: Secondary | ICD-10-CM | POA: Insufficient documentation

## 2020-02-07 DIAGNOSIS — U071 COVID-19: Secondary | ICD-10-CM | POA: Diagnosis not present

## 2020-02-07 DIAGNOSIS — F17290 Nicotine dependence, other tobacco product, uncomplicated: Secondary | ICD-10-CM | POA: Insufficient documentation

## 2020-02-07 HISTORY — DX: Immunodeficiency, unspecified: D84.9

## 2020-02-07 LAB — CBC
HCT: 38.1 % (ref 36.0–46.0)
Hemoglobin: 13.1 g/dL (ref 12.0–15.0)
MCH: 30.3 pg (ref 26.0–34.0)
MCHC: 34.4 g/dL (ref 30.0–36.0)
MCV: 88 fL (ref 80.0–100.0)
Platelets: 293 10*3/uL (ref 150–400)
RBC: 4.33 MIL/uL (ref 3.87–5.11)
RDW: 12.5 % (ref 11.5–15.5)
WBC: 4.2 10*3/uL (ref 4.0–10.5)
nRBC: 0 % (ref 0.0–0.2)

## 2020-02-07 LAB — COMPREHENSIVE METABOLIC PANEL
ALT: 30 U/L (ref 0–44)
AST: 25 U/L (ref 15–41)
Albumin: 4.6 g/dL (ref 3.5–5.0)
Alkaline Phosphatase: 35 U/L — ABNORMAL LOW (ref 38–126)
Anion gap: 13 (ref 5–15)
BUN: 12 mg/dL (ref 6–20)
CO2: 23 mmol/L (ref 22–32)
Calcium: 9.7 mg/dL (ref 8.9–10.3)
Chloride: 99 mmol/L (ref 98–111)
Creatinine, Ser: 0.66 mg/dL (ref 0.44–1.00)
GFR, Estimated: >60 mL/min (ref >60)
Glucose, Bld: 90 mg/dL (ref 70–99)
Potassium: 3.7 mmol/L (ref 3.5–5.1)
Sodium: 135 mmol/L (ref 135–145)
Total Bilirubin: 0.7 mg/dL (ref 0.3–1.2)
Total Protein: 8.1 g/dL (ref 6.5–8.1)

## 2020-02-07 LAB — URINALYSIS, ROUTINE W REFLEX MICROSCOPIC
Bilirubin Urine: NEGATIVE
Glucose, UA: NEGATIVE mg/dL
Hgb urine dipstick: NEGATIVE
Ketones, ur: 40 mg/dL — AB
Leukocytes,Ua: NEGATIVE
Nitrite: NEGATIVE
Protein, ur: NEGATIVE mg/dL
Specific Gravity, Urine: 1.025 (ref 1.005–1.030)
pH: 6 (ref 5.0–8.0)

## 2020-02-07 LAB — PREGNANCY, URINE: Preg Test, Ur: NEGATIVE

## 2020-02-07 LAB — LIPASE, BLOOD: Lipase: 27 U/L (ref 11–51)

## 2020-02-07 MED ORDER — ONDANSETRON HCL 4 MG/2ML IJ SOLN
4.0000 mg | Freq: Once | INTRAMUSCULAR | Status: AC
  Administered 2020-02-07: 4 mg via INTRAVENOUS
  Filled 2020-02-07: qty 2

## 2020-02-07 MED ORDER — ALUM & MAG HYDROXIDE-SIMETH 200-200-20 MG/5ML PO SUSP
30.0000 mL | Freq: Once | ORAL | Status: AC
  Administered 2020-02-07: 30 mL via ORAL
  Filled 2020-02-07: qty 30

## 2020-02-07 MED ORDER — METOCLOPRAMIDE HCL 5 MG/ML IJ SOLN
10.0000 mg | Freq: Once | INTRAMUSCULAR | Status: AC
  Administered 2020-02-07: 10 mg via INTRAVENOUS
  Filled 2020-02-07: qty 2

## 2020-02-07 MED ORDER — SODIUM CHLORIDE 0.9 % IV BOLUS
1000.0000 mL | Freq: Once | INTRAVENOUS | Status: AC
  Administered 2020-02-07: 1000 mL via INTRAVENOUS

## 2020-02-07 MED ORDER — LIDOCAINE VISCOUS HCL 2 % MT SOLN: 15.0000 mL | OROMUCOSAL | 0 refills | Status: DC | PRN

## 2020-02-07 MED ORDER — METOCLOPRAMIDE HCL 10 MG PO TABS: 10.0000 mg | ORAL_TABLET | Freq: Four times a day (QID) | ORAL | 0 refills | Status: DC

## 2020-02-07 MED ORDER — DIPHENHYDRAMINE HCL 50 MG/ML IJ SOLN
25.0000 mg | Freq: Once | INTRAMUSCULAR | Status: AC
  Administered 2020-02-07: 25 mg via INTRAVENOUS
  Filled 2020-02-07: qty 1

## 2020-02-07 MED ORDER — ONDANSETRON 4 MG PO TBDP: 4.0000 mg | ORAL_TABLET | Freq: Three times a day (TID) | ORAL | 0 refills | Status: DC | PRN

## 2020-02-07 MED ORDER — KETOROLAC TROMETHAMINE 15 MG/ML IJ SOLN
15.0000 mg | Freq: Once | INTRAMUSCULAR | Status: AC
  Administered 2020-02-07: 15 mg via INTRAVENOUS
  Filled 2020-02-07: qty 1

## 2020-02-07 MED ORDER — LIDOCAINE VISCOUS HCL 2 % MT SOLN
15.0000 mL | Freq: Once | OROMUCOSAL | Status: AC
  Administered 2020-02-07: 15 mL via ORAL
  Filled 2020-02-07: qty 15

## 2020-02-07 NOTE — Discharge Instructions (Signed)
Your symptoms are likely due to a Covid infection.  This is a viral illness that should be treated symptomatically. Use Tylenol or ibuprofen as needed for fever or pain. Use Zofran or Reglan or needed as needed for nausea or vomiting. Use the lidocaine liquid as needed for sore throat. Make sure you are staying well-hydrated with water. Follow-up with your primary care doctor as needed for further evaluation. Return to the emergency room if you develop chest pain, difficulty breathing, persistent vomiting despite medication, or any new, worsening, or concerning symptoms.

## 2020-02-07 NOTE — ED Provider Notes (Signed)
[facility] EMERGENCY DEPARTMENT Provider Note   CSN: [id number] Arrival date & time: 02/07/20  1458     History Chief Complaint  Patient presents with  . Emesis    Charlene Griffin is a 30 y.o. female presenting for evaluation of nausea, vomiting, back pain and sore throat.  Patient states her symptoms began last night.  She took a home Covid test today which she said was faintly positive.  She reports sore throat, persistent nausea and vomiting, low back pain/body aches.  She does not have any significant cough, chest pain, shortness of breath.  She is most concerned about her inability to tolerate p.o. since last night.  She is not taken anything for her symptoms.  She denies sick contacts.  She is fully vaccinated for Covid.  She has no history of asthma or COPD.  Patient states last night she vomited extremely forcefully.  Since then, she has noticed intermittent streaks of blood when she vomits, but does not happen all the time.  Additional history obtained in chart review.  Patient with a history of anxiety, GERD, PCOS  HPI     Past Medical History:  Diagnosis Date  . ADHD   . Alopecia   . Anxiety   . Bronchitis   . Complication of anesthesia    intubation bronchitis  . GERD (gastroesophageal reflux disease)    rare  . Hemorrhoids   . Immune deficiency disorder (HCC)   . Insomnia   . Major depression   . Migraine with aura   . Panic attack    with surgery  . PCOS (polycystic ovarian syndrome)   . PONV (postoperative nausea and vomiting)   . Tinnitus   . Umbilical hernia     Patient Active Problem List   Diagnosis Date Noted  . Incisional hernia 09/02/2019  . Migraine with aura   . Non-seasonal allergic rhinitis 04/04/2018  . Tinnitus of both ears 04/04/2018  . Alopecia areata 02/08/2017  . Enlarged thyroid gland 02/08/2017  . Anxiety 12/11/2015  . Attention deficit hyperactivity disorder (ADHD) 12/11/2015    Past Surgical History:   Procedure Laterality Date  . HERNIA REPAIR     x 2  . INCISIONAL HERNIA REPAIR N/A 09/02/2019   Procedure: LAPAROSCOPIC INCISIONAL HERNIA REPAIR WITH MESH;  Surgeon: Kinsinger, De Blanch, MD;  Location: [facility];  Service: General;  Laterality: N/A;  . LAPAROSCOPY N/A 12/29/2018   Procedure: LAPAROSCOPY DIAGNOSTIC;  Surgeon: Romualdo Bolk, MD;  Location: [facility];  Service: Gynecology;  Laterality: N/A;  1 hour surgery time/ follow Dr Rica Records case  . LEFT OOPHORECTOMY    . OVARIAN CYST REMOVAL Left 12/29/2018   Procedure: LAPARASCOPIC OOPHARECTOMY;  Surgeon: Romualdo Bolk, MD;  Location: [facility];  Service: Gynecology;  Laterality: Left;  . WISDOM TOOTH EXTRACTION       OB History    Gravida  0   Para  0   Term  0   Preterm  0   AB  0   Living  0     SAB  0   IAB  0   Ectopic  0   Multiple  0   Live Births  0           Family History  Problem Relation Age of Onset  . Colon cancer Maternal Grandfather        passed away 2012/05/24  . Healthy Mother   . Healthy Father  Social History   Tobacco Use  . Smoking status: Current Some Day Smoker    Types: E-cigarettes  . Smokeless tobacco: Never Used  Vaping Use  . Vaping Use: Every day  . Substances: Nicotine, Flavoring  Substance Use Topics  . Alcohol use: Yes    Comment: socially  . Drug use: Never    Home Medications Prior to Admission medications   Medication Sig Start Date End Date Taking? Authorizing Provider  lidocaine (XYLOCAINE) 2 % solution Use as directed 15 mLs in the mouth or throat as needed for mouth pain. 02/07/20  Yes Karron Goens, PA-C  metoCLOPramide (REGLAN) 10 MG tablet Take 1 tablet (10 mg total) by mouth every 6 (six) hours. 02/07/20  Yes Leena Tiede, PA-C  ondansetron (ZOFRAN ODT) 4 MG disintegrating tablet Take 1 tablet (4 mg total) by mouth every 8 (eight) hours as needed for nausea or vomiting. 02/07/20  Yes Michaelann Gunnoe,  Jeymi Hepp, PA-C  albuterol (PROVENTIL) (2.5 MG/3ML) 0.083% nebulizer solution Take 3 mLs (2.5 mg total) by nebulization every 6 (six) hours as needed for wheezing or shortness of breath. 01/19/20   Bing Neighbors, FNP  albuterol (VENTOLIN HFA) 108 (90 Base) MCG/ACT inhaler Inhale 1 puff into the lungs every 6 (six) hours as needed for wheezing or shortness of breath.  Patient not taking: Reported on 01/19/2020 02/18/19   [provider]  benzonatate (TESSALON) 100 MG capsule Take 1-2 capsules (100-200 mg total) by mouth 3 (three) times daily as needed for cough. 01/19/20   Bing Neighbors, FNP  etonogestrel (NEXPLANON) 68 MG IMPL implant 68 mg by Subdermal route once.  Patient not taking: Reported on 01/19/2020 09/15/18   [provider]  famotidine (PEPCID) 20 MG tablet Take 1 tablet (20 mg total) by mouth 2 (two) times daily as needed (Gastritis symptoms). 01/19/20   Bing Neighbors, FNP  Galcanezumab-gnlm (EMGALITY) 120 MG/ML SOAJ Inject 120 mg into the skin every 30 (thirty) days.  Patient not taking: Reported on 01/19/2020    [provider]  ibuprofen (ADVIL) 800 MG tablet Take 1 tablet (800 mg total) by mouth every 8 (eight) hours as needed. 09/02/19   Kinsinger, De Blanch, MD  lidocaine (XYLOCAINE) 5 % ointment Apply topically 20 minutes prior to intercourse, wipe off just prior to intercourse. 12/03/18   Romualdo Bolk, MD  ondansetron (ZOFRAN) 4 MG tablet Take 1-2 tablets (4-8 mg total) by mouth every 8 (eight) hours as needed for nausea or vomiting. 01/19/20   Bing Neighbors, FNP  oxyCODONE (OXY IR/ROXICODONE) 5 MG immediate release tablet Take 1 tablet (5 mg total) by mouth every 6 (six) hours as needed for severe pain. 09/02/19   Kinsinger, De Blanch, MD  promethazine (PHENERGAN) 25 MG tablet Take 1 tablet (25 mg total) by mouth every 6 (six) hours as needed for nausea or vomiting. 09/06/19   Jacalyn Lefevre, MD  Rimegepant Sulfate (NURTEC) 75 MG TBDP Take  75 mg by mouth daily as needed (migraines).     [provider]  verapamil (CALAN) 40 MG tablet Take 40 mg by mouth as needed.  Patient not taking: Reported on 01/19/2020 09/19/18   [provider]    Allergies    Botox [botulinum toxin type a]  Review of Systems   Review of Systems  Constitutional: Positive for fever.  HENT: Positive for sore throat.   Gastrointestinal: Positive for nausea and vomiting.  Musculoskeletal: Positive for back pain and myalgias.  All other systems reviewed  and are negative.   Physical Exam Updated Vital Signs BP 122/64 (BP Location: Right Arm)   Pulse 85   Temp 99 F (37.2 C) (Oral)   Resp 18   LMP 01/28/2020 Comment: negative preg test on 02/07/20  SpO2 100%   Physical Exam Vitals and nursing note reviewed.  Constitutional:      General: She is not in acute distress.    Appearance: She is well-developed and well-nourished. She is obese.     Comments: Resting in the bed in no acute distress  HENT:     Head: Normocephalic and atraumatic.     Mouth/Throat:     Pharynx: Posterior oropharyngeal erythema present.     Comments: OP erythematous without signs of tonsillar abscess or exudate.  Uvula midline.  Handling secretions easily. Eyes:     Extraocular Movements: EOM normal.     Conjunctiva/sclera: Conjunctivae normal.     Pupils: Pupils are equal, round, and reactive to light.  Cardiovascular:     Rate and Rhythm: Normal rate and regular rhythm.     Pulses: Normal pulses and intact distal pulses.  Pulmonary:     Effort: Pulmonary effort is normal. No respiratory distress.     Breath sounds: Normal breath sounds. No wheezing.     Comments: Speaking in full sentences.  Clear lung sounds in all fields.  Sats stable on room air. Abdominal:     General: There is no distension.     Palpations: Abdomen is soft. There is no mass.     Tenderness: There is no abdominal tenderness. There is no guarding or rebound.     Comments: No  focal TTP of the abdomen.  No rigidity, guarding, distention  Musculoskeletal:        General: Normal range of motion.     Cervical back: Normal range of motion and neck supple.  Skin:    General: Skin is warm and dry.     Capillary Refill: Capillary refill takes less than 2 seconds.  Neurological:     Mental Status: She is alert and oriented to person, place, and time.  Psychiatric:        Mood and Affect: Mood and affect normal.     ED Results / Procedures / Treatments   Labs (all labs ordered are listed, but only abnormal results are displayed) Labs Reviewed  COMPREHENSIVE METABOLIC PANEL - Abnormal; Notable for the following components:      Result Value   Alkaline Phosphatase 35 (*)    All other components within normal limits  URINALYSIS, ROUTINE W REFLEX MICROSCOPIC - Abnormal; Notable for the following components:   Ketones, ur 40 (*)    All other components within normal limits  LIPASE, BLOOD  CBC  PREGNANCY, URINE    EKG None  Radiology DG Abdomen Acute W/Chest  Result Date: 02/07/2020 CLINICAL DATA:  COVID positive. Nausea and vomiting. Rule out perforation EXAM: DG ABDOMEN ACUTE WITH 1 VIEW CHEST COMPARISON:  Abdominal series 09/06/2019 FINDINGS: Heart is normal in size. Normal mediastinal contours. No focal airspace disease or evidence of pneumonia. No pleural fluid or pneumothorax. No pneumomediastinum. No free intra-abdominal air. Normal bowel gas pattern. No bowel dilatation to suggest obstruction. Small volume of colonic stool. No acute osseous abnormalities are seen. IMPRESSION: 1. Normal bowel gas pattern.  No free air or pneumomediastinum. 2. Clear lungs.  No evidence of pneumonia. Electronically Signed   By: Narda Rutherford M.D.   On: 02/07/2020 17:49    Procedures  Procedures   Medications Ordered in ED Medications  sodium chloride 0.9 % bolus 1,000 mL (0 mLs Intravenous Stopped 02/07/20 1822)  ondansetron (ZOFRAN) injection 4 mg (4 mg Intravenous  Given 02/07/20 1709)  alum & mag hydroxide-simeth (MAALOX/MYLANTA) 200-200-20 MG/5ML suspension 30 mL (30 mLs Oral Given 02/07/20 1824)    And  lidocaine (XYLOCAINE) 2 % viscous mouth solution 15 mL (15 mLs Oral Given 02/07/20 1824)  metoCLOPramide (REGLAN) injection 10 mg (10 mg Intravenous Given 02/07/20 1924)  diphenhydrAMINE (BENADRYL) injection 25 mg (25 mg Intravenous Given 02/07/20 1922)  ketorolac (TORADOL) 15 MG/ML injection 15 mg (15 mg Intravenous Given 02/07/20 1920)    ED Course  I have reviewed the triage vital signs and the nursing notes.  Pertinent labs & imaging results that were available during my care of the patient were reviewed by me and considered in my medical decision making (see chart for details).    MDM Rules/Calculators/A&P                          Patient presenting for evaluation nausea, vomiting, generalized body aches and sore throat in the setting of likely Covid infection.  On exam, patient appears nontoxic.  She has no focal pain in the abdomen, doubt intra-abdominal infection, perforation, obstruction, surgical abdomen.  Likely symptoms due to Covid.  Labs obtained from triage interpreted by me, overall reassuring.  No leukocytosis.  Electrolytes stable.  Urine without signs of infection.  As pt reports occasional streaks of blood with vomiting, will obtain acute abdomen series to ensure no free air.  However favor Mallory-Weiss tear at this time.  Also consider erythema/edema of the oropharynx that could be bleeding slightly when vomiting.  No active bleeding noted on my exam however.  As patient is without focal abdominal pain, I do not believe she needs emergent CT for this.  On reassessment after medication, she reports improvement of sore throat and pain.  However she continues to have vomiting.  Will treat with Reglan, Benadryl, Toradol, and reassess.   On reassessment, patient report significant improvement of symptoms.  No further vomiting.  Tolerating  p.o. without difficulty.  Discussed continued symptomatic treatment at home, return to the ER with any signs of worsening respiratory status.  At this time, patient appears safe for discharge.  Return precautions given.  Patient states she understands and agrees to plan.  Final Clinical Impression(s) / ED Diagnoses Final diagnoses:  COVID-19    Rx / DC Orders ED Discharge Orders         Ordered    ondansetron (ZOFRAN ODT) 4 MG disintegrating tablet  Every 8 hours PRN        02/07/20 2031    metoCLOPramide (REGLAN) 10 MG tablet  Every 6 hours        02/07/20 2031    lidocaine (XYLOCAINE) 2 % solution  As needed        02/07/20 2032           Alveria Apley, PA-C 02/07/20 2034    Rolan Bucco, MD 02/07/20 2041

## 2020-02-07 NOTE — ED Triage Notes (Signed)
Pt arrives pov with c/o N/V, endorses hemoptysis, small amounts since last night, and sore throat with  difficulty swallowing. Pt also reports lower back pain. Pt endorse Home Covid+ test today. Pt on phone in triage

## 2020-04-08 ENCOUNTER — Other Ambulatory Visit: Payer: Self-pay

## 2020-04-08 ENCOUNTER — Encounter: Payer: Self-pay | Admitting: Allergy and Immunology

## 2020-04-08 ENCOUNTER — Ambulatory Visit: Admitting: Allergy and Immunology

## 2020-04-08 VITALS — BP 118/70 | HR 70 | Resp 16 | Ht 66.0 in | Wt 174.6 lb

## 2020-04-08 DIAGNOSIS — H10829 Rosacea conjunctivitis, unspecified eye: Secondary | ICD-10-CM | POA: Diagnosis not present

## 2020-04-08 DIAGNOSIS — Z8709 Personal history of other diseases of the respiratory system: Secondary | ICD-10-CM

## 2020-04-08 DIAGNOSIS — H10823 Rosacea conjunctivitis, bilateral: Secondary | ICD-10-CM

## 2020-04-08 DIAGNOSIS — H1013 Acute atopic conjunctivitis, bilateral: Secondary | ICD-10-CM

## 2020-04-08 DIAGNOSIS — K219 Gastro-esophageal reflux disease without esophagitis: Secondary | ICD-10-CM

## 2020-04-08 DIAGNOSIS — L719 Rosacea, unspecified: Secondary | ICD-10-CM | POA: Diagnosis not present

## 2020-04-08 DIAGNOSIS — B999 Unspecified infectious disease: Secondary | ICD-10-CM

## 2020-04-08 MED ORDER — OMEPRAZOLE 40 MG PO CPDR: DELAYED_RELEASE_CAPSULE | ORAL | 5 refills | Status: DC

## 2020-04-08 MED ORDER — MONTELUKAST SODIUM 10 MG PO TABS: 10.0000 mg | ORAL_TABLET | Freq: Every day | ORAL | 5 refills | Status: DC

## 2020-04-08 MED ORDER — OLOPATADINE HCL 0.2 % OP SOLN: OPHTHALMIC | 5 refills | Status: DC

## 2020-04-08 MED ORDER — METRONIDAZOLE 0.75 % EX CREA: TOPICAL_CREAM | CUTANEOUS | 5 refills | Status: DC

## 2020-04-08 NOTE — Progress Notes (Signed)
[place] - [place] - [place] - [place] - [place]   Dear Dr. Hurman Horn,  Thank you for referring Charlene Griffin to the [facility] [place] on 04/08/2020.   Below is a summation of this patient's evaluation and recommendations.  Thank you for your referral. I will keep you informed about this patient's response to treatment.   If you have any questions please do not hesitate to contact me.   Sincerely,  Jessica Priest, MD Allergy / Immunology [place] [facility]   ______________________________________________________________________    NEW PATIENT NOTE  Referring Provider: Nestor Ramp, MD Primary Provider: Nestor Ramp, MD Date of office visit: 04/08/2020    Subjective:   Chief Complaint:  Charlene Griffin (DOB: [birth year]) is a 30 y.o. female who presents to the clinic on 04/08/2020 with a chief complaint of Allergic Rhinitis  .     HPI: Charlene Griffin presents to this clinic in evaluation of several issues.  First, she has a long history of "bronchitis".  But ever since Covid has arrived and she has been using a mask her bronchitis episodes, manifested as coughing, appears to occur only about 1 time per year.  Second, she has an issue with springtime "allergies".  She did visit with a local ENT doctor in evaluation of her allergies and tinnitis.  Apparently a blood test was performed which identified no allergies.  She complains of having irritated eyes.  Significantly, her eyes feel puffy and the lining of the eye is irritated and this appears to occur in conjunction with puffiness of her face and redness of her face.  This occurs from spring through fall season usually when it is hot.  She has a history of blushing easy and she has a history of facial flushing when she drinks alcohol and she has a history of turning very red in her face when she turns warm.  She has tried various types  of nasal steroids which did not help this issue and she has tried antihistamines which she is not really sure if helped as well.  Third, she appears to have reflux.  For about 18 months she was vomiting about every day.  Over the course of the past 2 months she eliminated gluten from her diet and now she only vomits about 1 time per month.  She has regurgitation.  She drinks a coffee or tea per day and has chocolate every day.  Past Medical History:  Diagnosis Date  . ADHD   . Alopecia   . Anxiety   . Bronchitis   . Complication of anesthesia    intubation bronchitis  . GERD (gastroesophageal reflux disease)    rare  . Hemorrhoids   . Immune deficiency disorder (HCC)   . Insomnia   . Major depression   . Migraine with aura   . Panic attack    with surgery  . PCOS (polycystic ovarian syndrome)   . PONV (postoperative nausea and vomiting)   . Tinnitus   . Umbilical hernia     Past Surgical History:  Procedure Laterality Date  . HERNIA REPAIR     x 2  . INCISIONAL HERNIA REPAIR N/A 09/02/2019   Procedure: LAPAROSCOPIC INCISIONAL HERNIA REPAIR WITH MESH;  Surgeon: Kinsinger, De Blanch, MD;  Location: [facility];  Service: General;  Laterality: N/A;  . LAPAROSCOPY N/A 12/29/2018   Procedure: LAPAROSCOPY DIAGNOSTIC;  Surgeon: Romualdo Bolk, MD;  Location: [facility];  Service: Gynecology;  Laterality: N/A;  1 hour surgery time/ follow Dr Rica Records case  . LEFT OOPHORECTOMY    . OVARIAN CYST REMOVAL Left 12/29/2018   Procedure: LAPARASCOPIC OOPHARECTOMY;  Surgeon: Romualdo Bolk, MD;  Location: [facility];  Service: Gynecology;  Laterality: Left;  . WISDOM TOOTH EXTRACTION      Allergies as of 04/08/2020      Reactions   Botox [botulinum Toxin Type A] Rash, Other (See Comments)   Mood changes       Medication List    albuterol 108 (90 Base) MCG/ACT inhaler Commonly known as: VENTOLIN HFA Inhale 1 puff into the lungs every 6 (six) hours  as needed for wheezing or shortness of breath.   albuterol (2.5 MG/3ML) 0.083% nebulizer solution Commonly known as: PROVENTIL Take 3 mLs (2.5 mg total) by nebulization every 6 (six) hours as needed for wheezing or shortness of breath.   estradiol 0.1 MG/GM vaginal cream Commonly known as: ESTRACE Place vaginally.   ibuprofen 800 MG tablet Commonly known as: ADVIL Take 1 tablet (800 mg total) by mouth every 8 (eight) hours as needed.   Nurtec 75 MG Tbdp Generic drug: Rimegepant Sulfate Take 75 mg by mouth daily as needed (migraines).   Xulane 150-35 MCG/24HR transdermal patch Generic drug: norelgestromin-ethinyl estradiol 1 patch once a week.       Review of systems negative except as noted in HPI / PMHx or noted below:  Review of Systems  Constitutional: Negative.   HENT: Negative.   Eyes: Negative.   Respiratory: Negative.   Cardiovascular: Negative.   Gastrointestinal: Negative.   Genitourinary: Negative.   Musculoskeletal: Negative.   Skin: Negative.   Neurological: Negative.   Endo/Heme/Allergies: Negative.   Psychiatric/Behavioral: Negative.     Family History  Problem Relation Age of Onset  . Colon cancer Maternal Grandfather        passed away 2012-05-02  . Healthy Mother   . Healthy Father   . Asthma Sister   . Factor V Leiden deficiency Sister   . Ovarian cancer Paternal Grandmother     Social History   Socioeconomic History  . Marital status: Married    Spouse name: Not on file  . Number of children: Not on file  . Years of education: Not on file  . Highest education level: Not on file  Occupational History  . Not on file  Tobacco Use  . Smoking status: Current Some Day Smoker    Types: E-cigarettes  . Smokeless tobacco: Never Used  Vaping Use  . Vaping Use: Some days  . Substances: Nicotine, Flavoring  Substance and Sexual Activity  . Alcohol use: Yes    Comment: socially  . Drug use: Never  . Sexual activity: Yes    Birth  control/protection: Patch  Other Topics Concern  . Not on file  Social History Narrative  . Not on file   Environmental and Social history  Lives in a apartment with a dry environment, no animals look inside the household, carpet in the bedroom, plastic on the bed, no plastic on the pillow, no smoking ongoing inside the household.  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  Objective:   Vitals:   04/08/20 1432  BP: 118/70  Pulse: 70  Resp: 16  SpO2: 98%   Height: 5\' 6"  (167.6 cm) Weight: 174 lb 9.6 oz (79.2 kg)  Physical Exam Constitutional:      Appearance:  She is not diaphoretic.  HENT:     Head: Normocephalic.     Right Ear: Tympanic membrane, ear canal and external ear normal.     Left Ear: Tympanic membrane, ear canal and external ear normal.     Nose: Nose normal. No mucosal edema or rhinorrhea.     Mouth/Throat:     Pharynx: Uvula midline. No oropharyngeal exudate.  Eyes:     Conjunctiva/sclera: Conjunctivae normal.  Neck:     Thyroid: No thyromegaly.     Trachea: Trachea normal. No tracheal tenderness or tracheal deviation.  Cardiovascular:     Rate and Rhythm: Normal rate and regular rhythm.     Heart sounds: Normal heart sounds, S1 normal and S2 normal. No murmur heard.   Pulmonary:     Effort: No respiratory distress.     Breath sounds: Normal breath sounds. No stridor. No wheezing or rales.  Lymphadenopathy:     Head:     Right side of head: No tonsillar adenopathy.     Left side of head: No tonsillar adenopathy.     Cervical: No cervical adenopathy.  Skin:    Findings: No erythema or rash.     Nails: There is no clubbing.  Neurological:     Mental Status: She is alert.     Diagnostics: Allergy skin tests were not performed secondary to the recent use of an antihistamine.   Spirometry was performed and demonstrated an FEV1 of 3.54 @ 104 % of predicted. FEV1/FVC = 0.82  Assessment and Plan:    1. Perennial allergic  conjunctivitis of both eyes   2. Rosacea   3. Rosacea blepharoconjunctivitis   4. Gastroesophageal reflux disease, unspecified whether esophagitis present   5. Recurrent infections     1.  Allergen avoidance measures?  2.  Treat and prevent inflammation:   A. metrocream applied to face 2 times per day  B. Montelukast 10 mg - 1 tablet 1 time per day  3.  Treat and prevent reflux/LPR:   A. Consolidate caffeine consumption  B. Omeprazole 40 mg - 1 tablet 1 time per day  4.  If needed:   A. Pataday - 1 drop each eye 1 time per day  5.  Blood - CBC wd, IgA/G/M, IgE, celiac screen, anti-pneumococcal antibodies, anti-tetanus antibodies.  6. Further evaluation?  7. Return to clinic in 4 weeks or earlier if problem  Charlene Griffin appears to have inflammation of the lining of her eyes and face most likely from rosacea and she also appears to have irritation of her mid airway most likely secondary from reflux disease.  And she has a history of recurrent infections and a lot of GI symptoms suggesting possible celiac disease.  I am going to have her treat rosacea with MetroCream and will give her montelukast to help with a inflammatory component of her eyes and we will treat her reflux/LPR with the therapy noted above.  I am going to obtain some blood tests and investigation of her recurrent infections and I am going to also check a celiac screen to investigate her GI complaints in more detail.  It should be noted that she has been gluten-free for 2 months so if the celiac screen test is positive it would be helpful but it of is negative then it will be difficult to interpret given the fact that she has not been eating any gluten for the past 2 months.  I will see her back in this clinic in 4 weeks  to evaluate her response to therapy noted above and consider further evaluation and treatment based upon her response.  Jessica PriestEric J. Melquisedec Journey, MD Allergy / Immunology  [facility] [place]  [place]

## 2020-04-08 NOTE — Patient Instructions (Addendum)
  1.  Allergen avoidance measures?  2.  Treat and prevent inflammation:   A. metrocream applied to face 2 times per day  B. Montelukast 10 mg - 1 tablet 1 time per day  3.  Treat and prevent reflux/LPR:   A. Consolidate caffeine consumption  B. Omeprazole 40 mg - 1 tablet 1 time per day  4.  If needed:   A. Pataday - 1 drop each eye 1 time per day  5.  Blood - CBC wd, IgA/G/M, IgE, celiac screen, anti-pneumococcal antibodies, anti-tetanus antibodies.  6. Further evaluation?  7. Return to clinic in 4 weeks or earlier if problem

## 2020-04-11 ENCOUNTER — Encounter: Payer: Self-pay | Admitting: Allergy and Immunology

## 2020-04-11 NOTE — Addendum Note (Signed)
Addended by: Berna Bue on: 04/11/2020 04:46 PM   Modules accepted: Orders

## 2020-04-20 ENCOUNTER — Telehealth: Payer: Self-pay

## 2020-04-20 NOTE — Telephone Encounter (Signed)
Pt. Calling for test results. Inform pt. That all tests are not back yet. Told pt. Will call  once Dr. Lucie Leather has reviewed all the labs.

## 2020-04-20 NOTE — Telephone Encounter (Signed)
Looks like the only thing pending are Titers.

## 2020-04-21 NOTE — Telephone Encounter (Signed)
Still pending

## 2020-04-25 NOTE — Telephone Encounter (Signed)
Spoke with [facility] and pnemo titers were sent out to another lab- they usually process in 7-10 days. They spoke to the outside Lab Kendal Hymen) and were told they should have results by Friday evening.

## 2020-04-25 NOTE — Telephone Encounter (Signed)
Pt was informed.

## 2020-04-28 LAB — CBC WITH DIFFERENTIAL/PLATELET
Basophils Absolute: 0 10*3/uL (ref 0.0–0.2)
Basos: 0 %
EOS (ABSOLUTE): 0.2 10*3/uL (ref 0.0–0.4)
Eos: 2 %
Hematocrit: 38 % (ref 34.0–46.6)
Hemoglobin: 13.1 g/dL (ref 11.1–15.9)
Immature Grans (Abs): 0 10*3/uL (ref 0.0–0.1)
Immature Granulocytes: 0 %
Lymphocytes Absolute: 2.6 10*3/uL (ref 0.7–3.1)
Lymphs: 31 %
MCH: 31.2 pg (ref 26.6–33.0)
MCHC: 34.5 g/dL (ref 31.5–35.7)
MCV: 91 fL (ref 79–97)
Monocytes Absolute: 0.5 10*3/uL (ref 0.1–0.9)
Monocytes: 6 %
Neutrophils Absolute: 5 10*3/uL (ref 1.4–7.0)
Neutrophils: 61 %
Platelets: 312 10*3/uL (ref 150–450)
RBC: 4.2 x10E6/uL (ref 3.77–5.28)
RDW: 12.9 % (ref 11.7–15.4)
WBC: 8.4 10*3/uL (ref 3.4–10.8)

## 2020-04-28 LAB — STREP PNEUMONIAE 23 SEROTYPES IGG
Pneumo Ab Type 1*: 0.4 ug/mL — ABNORMAL LOW (ref >1.3)
Pneumo Ab Type 12 (12F)*: <0.1 ug/mL — ABNORMAL LOW (ref >1.3)
Pneumo Ab Type 14*: 0.6 ug/mL — ABNORMAL LOW (ref >1.3)
Pneumo Ab Type 17 (17F)*: 1 ug/mL — ABNORMAL LOW (ref >1.3)
Pneumo Ab Type 19 (19F)*: 1.1 ug/mL — ABNORMAL LOW (ref >1.3)
Pneumo Ab Type 2*: 0.8 ug/mL — ABNORMAL LOW (ref >1.3)
Pneumo Ab Type 20*: 1.7 ug/mL (ref >1.3)
Pneumo Ab Type 22 (22F)*: 0.7 ug/mL — ABNORMAL LOW (ref >1.3)
Pneumo Ab Type 23 (23F)*: <0.1 ug/mL — ABNORMAL LOW (ref >1.3)
Pneumo Ab Type 26 (6B)*: 0.7 ug/mL — ABNORMAL LOW (ref >1.3)
Pneumo Ab Type 3*: 2.7 ug/mL (ref >1.3)
Pneumo Ab Type 34 (10A)*: 0.4 ug/mL — ABNORMAL LOW (ref >1.3)
Pneumo Ab Type 4*: 0.8 ug/mL — ABNORMAL LOW (ref >1.3)
Pneumo Ab Type 43 (11A)*: 1.3 ug/mL — ABNORMAL LOW (ref >1.3)
Pneumo Ab Type 5*: 1 ug/mL — ABNORMAL LOW (ref >1.3)
Pneumo Ab Type 51 (7F)*: 0.4 ug/mL — ABNORMAL LOW (ref >1.3)
Pneumo Ab Type 54 (15B)*: 0.2 ug/mL — ABNORMAL LOW (ref >1.3)
Pneumo Ab Type 56 (18C)*: 0.4 ug/mL — ABNORMAL LOW (ref >1.3)
Pneumo Ab Type 57 (19A)*: 2.6 ug/mL (ref >1.3)
Pneumo Ab Type 68 (9V)*: 1 ug/mL — ABNORMAL LOW (ref >1.3)
Pneumo Ab Type 70 (33F)*: 1 ug/mL — ABNORMAL LOW (ref >1.3)
Pneumo Ab Type 8*: 2.4 ug/mL (ref >1.3)
Pneumo Ab Type 9 (9N)*: 2.6 ug/mL (ref >1.3)

## 2020-04-28 LAB — CELIAC PANEL 10
Antigliadin Abs, IgA: 4 units (ref 0–19)
Endomysial IgA: NEGATIVE
Gliadin IgG: 2 units (ref 0–19)
IgA/Immunoglobulin A, Serum: 138 mg/dL (ref 87–352)
Tissue Transglut Ab: <2 U/mL (ref 0–5)
Transglutaminase IgA: <2 U/mL (ref 0–3)

## 2020-04-28 LAB — DIPHTHERIA / TETANUS ANTIBODY PANEL
Diphtheria Ab: 0.11 IU/mL (ref <0.10)
Tetanus Ab, IgG: 2.62 IU/mL (ref <0.10)

## 2020-04-28 LAB — IGE: IgE (Immunoglobulin E), Serum: 6 IU/mL (ref 6–495)

## 2020-04-28 LAB — IGG, IGA, IGM
IgG (Immunoglobin G), Serum: 1099 mg/dL (ref 586–1602)
IgM (Immunoglobulin M), Srm: 395 mg/dL — ABNORMAL HIGH (ref 26–217)

## 2020-05-06 ENCOUNTER — Encounter: Payer: Self-pay | Admitting: Allergy and Immunology

## 2020-05-06 ENCOUNTER — Ambulatory Visit (INDEPENDENT_AMBULATORY_CARE_PROVIDER_SITE_OTHER)

## 2020-05-06 ENCOUNTER — Ambulatory Visit: Admitting: Allergy and Immunology

## 2020-05-06 ENCOUNTER — Other Ambulatory Visit: Payer: Self-pay

## 2020-05-06 VITALS — BP 128/82 | HR 76 | Temp 98.8°F | Resp 16

## 2020-05-06 DIAGNOSIS — H1013 Acute atopic conjunctivitis, bilateral: Secondary | ICD-10-CM | POA: Diagnosis not present

## 2020-05-06 DIAGNOSIS — K219 Gastro-esophageal reflux disease without esophagitis: Secondary | ICD-10-CM | POA: Diagnosis not present

## 2020-05-06 DIAGNOSIS — Z23 Encounter for immunization: Secondary | ICD-10-CM | POA: Diagnosis not present

## 2020-05-06 DIAGNOSIS — R1110 Vomiting, unspecified: Secondary | ICD-10-CM

## 2020-05-06 DIAGNOSIS — R110 Nausea: Secondary | ICD-10-CM

## 2020-05-06 DIAGNOSIS — D892 Hypergammaglobulinemia, unspecified: Secondary | ICD-10-CM

## 2020-05-06 DIAGNOSIS — H10829 Rosacea conjunctivitis, unspecified eye: Secondary | ICD-10-CM

## 2020-05-06 DIAGNOSIS — B999 Unspecified infectious disease: Secondary | ICD-10-CM

## 2020-05-06 MED ORDER — DOXYCYCLINE HYCLATE 50 MG PO CAPS: 50.0000 mg | ORAL_CAPSULE | Freq: Every day | ORAL | 1 refills | Status: DC

## 2020-05-06 NOTE — Progress Notes (Signed)
[place] - [place] - [place] - [place] - [place]   Follow-up Note  Referring Provider: Nestor Ramp, MD Primary Provider: Nestor Ramp, MD Date of Office Visit: 05/06/2020  Subjective:   Charlene Griffin (DOB: [birth year]) is a 30 y.o. female who [facility] the [facility] on 05/06/2020 in re-evaluation of the following:  HPI: Merline returns to this clinic in evaluation of allergic conjunctivitis, rosacea blepharoconjunctivitis, reflux with component of LPR, and a history of recurrent infections.  Her last visit to this clinic was her initial evaluation of 08 April 2020.  She does not believe that she has really noticed much difference in regard to her eyes or her facial skin while using MetroCream and continuing on montelukast.  Before she obtained her blood tests in investigation of possible celiac disease she did eat some gluten for a few days and she developed very significant GI upset and she developed a red area on her skin that look like a blister although it never opened up and she developed an ulcer on the top of her mouth.  Fortunately, after becoming gluten-free, once again she resolved all those issues.  She still continues to have recurrent regurgitation and vomiting on a daily basis.  This occurs even though she has consolidated her caffeine consumption and continues on a proton pump inhibitor.  She has seen a gastroenterologist in the past for an issue with lower abdominal pain.  Allergies as of 05/06/2020      Reactions   Botox [botulinum Toxin Type A] Rash, Other (See Comments)   Mood changes       Medication List    albuterol 108 (90 Base) MCG/ACT inhaler Commonly known as: VENTOLIN HFA Inhale 1 puff into the lungs every 6 (six) hours as needed for wheezing or shortness of breath.   albuterol (2.5 MG/3ML) 0.083% nebulizer solution Commonly known as: PROVENTIL Take 3 mLs (2.5 mg total) by nebulization every 6 (six) hours as  needed for wheezing or shortness of breath.   estradiol 0.1 MG/GM vaginal cream Commonly known as: ESTRACE Place vaginally.   metroNIDAZOLE 0.75 % cream Commonly known as: MetroCream Apply to face twice daily as directed.   montelukast 10 MG tablet Commonly known as: SINGULAIR Take 1 tablet (10 mg total) by mouth at bedtime.   Nurtec 75 MG Tbdp Generic drug: Rimegepant Sulfate Take 75 mg by mouth daily as needed (migraines).   Olopatadine HCl 0.2 % Soln Can use one drop in each eye once daily if needed.   omeprazole 40 MG capsule Commonly known as: PRILOSEC Take one capsule by mouth once daily.   Xulane 150-35 MCG/24HR transdermal patch Generic drug: norelgestromin-ethinyl estradiol 1 patch once a week.       Past Medical History:  Diagnosis Date  . ADHD   . Alopecia   . Anxiety   . Bronchitis   . Complication of anesthesia    intubation bronchitis  . GERD (gastroesophageal reflux disease)    rare  . Hemorrhoids   . Immune deficiency disorder (HCC)   . Insomnia   . Major depression   . Migraine with aura   . Panic attack    with surgery  . PCOS (polycystic ovarian syndrome)   . PONV (postoperative nausea and vomiting)   . Tinnitus   . Umbilical hernia     Past Surgical History:  Procedure Laterality Date  . HERNIA REPAIR     x 2  . INCISIONAL HERNIA  REPAIR N/A 09/02/2019   Procedure: LAPAROSCOPIC INCISIONAL HERNIA REPAIR WITH MESH;  Surgeon: Kinsinger, De Blanch, MD;  Location: [facility];  Service: General;  Laterality: N/A;  . LAPAROSCOPY N/A 12/29/2018   Procedure: LAPAROSCOPY DIAGNOSTIC;  Surgeon: Romualdo Bolk, MD;  [facility];  Service: Gynecology;  Laterality: N/A;  1 hour surgery time/ follow Dr Rica Records case  . LEFT OOPHORECTOMY    . OVARIAN CYST REMOVAL Left 12/29/2018   Procedure: LAPARASCOPIC OOPHARECTOMY;  Surgeon: Romualdo Bolk, MD;  Location: [facility];  Service: Gynecology;   Laterality: Left;  . WISDOM TOOTH EXTRACTION      Review of systems negative except as noted in HPI / PMHx or noted below:  Review of Systems  Constitutional: Negative.   HENT: Negative.   Eyes: Negative.   Respiratory: Negative.   Cardiovascular: Negative.   Gastrointestinal: Negative.   Genitourinary: Negative.   Musculoskeletal: Negative.   Skin: Negative.   Neurological: Negative.   Endo/Heme/Allergies: Negative.   Psychiatric/Behavioral: Negative.      Objective:   Vitals:   05/06/20 1009  BP: 128/82  Pulse: 76  Resp: 16  Temp: 98.8 F (37.1 C)  SpO2: 98%          Physical Exam Constitutional:      Appearance: She is not diaphoretic.  HENT:     Head: Normocephalic.     Right Ear: Tympanic membrane, ear canal and external ear normal.     Left Ear: Tympanic membrane, ear canal and external ear normal.     Nose: Nose normal. No mucosal edema or rhinorrhea.     Mouth/Throat:     Pharynx: Uvula midline. No oropharyngeal exudate.  Eyes:     Conjunctiva/sclera: Conjunctivae normal.  Neck:     Thyroid: No thyromegaly.     Trachea: Trachea normal. No tracheal tenderness or tracheal deviation.  Cardiovascular:     Rate and Rhythm: Normal rate and regular rhythm.     Heart sounds: Normal heart sounds, S1 normal and S2 normal. No murmur heard.   Pulmonary:     Effort: No respiratory distress.     Breath sounds: Normal breath sounds. No stridor. No wheezing or rales.  Lymphadenopathy:     Head:     Right side of head: No tonsillar adenopathy.     Left side of head: No tonsillar adenopathy.     Cervical: No cervical adenopathy.  Skin:    Findings: No erythema or rash (Red cheeks and forehead and nasal bridge).     Nails: There is no clubbing.  Neurological:     Mental Status: She is alert.     Diagnostics:    Results of blood tests obtained for April 2022 identified BC 8.4, absolute eosinophil 200, absolute lymphocyte 2600, hemoglobin 13.1, platelet  312, IgG 1099 mg/DL, IgM 779 mg/DL, IgA 390 mg/DL, IgE 6U/mL, transglutaminase IgA antibody less than 2U/mL, 18 out of 23 antipneumococcal serotype antibody levels and adequate, antitetanus IgG antibody 2.62 ug/mL which is protective.  Assessment and Plan:   1. Perennial allergic conjunctivitis of both eyes   2. Rosacea blepharoconjunctivitis   3. Gastroesophageal reflux disease, unspecified whether esophagitis present   4. Recurrent vomiting   5. Hypergammaglobulinemia   6. Recurrent infections     1.  Continue to Treat and prevent inflammation of airway and skin:   A. metrocream applied to face 2 times per day  B. Montelukast 10 mg - 1 tablet 1 time per  day  C. START Doxycycline 50 mg - 1 tablet 1 time per day (after GI evaluation)  2.  Treat and prevent reflux/LPR:   A. Consolidate caffeine consumption  B. Omeprazole 40 mg - 1 tablet 1 time per day  3. NEED GI evaluation for recurrent vomiting  4.  If needed:   A. Pataday - 1 drop each eye 1 time per day  5.  Pneumovax administered in clinic today.  6.  Repeat blood test in 4 weeks - Pneumo23 serotype assay, SPEP with reflex to immunofixation.    7. Return to clinic in 12 weeks or earlier if problem  Cambre appears to have continued problems with her eyes and the skin on her face and we will start her on doxycycline to treat rosacea.  However, before we have her use doxycycline I would like for her to undergo a more thorough evaluation for her recurrent vomiting with her gastroenterologist who she saw last year.  She really needs an upper endoscopy and biopsies of her esophagus to look for eosinophilic esophagitis and possible evaluation of her gallbladder function.  We will keep her on omeprazole at this point in time.  She did not really have any type of antibody response to pneumococcal antigens and we will now challenge her with a Pneumovax and see if her immune system can respond to this carbohydrate-based moiety.  As well,  she has an elevated IgM and we will check an SPEP during that blood test.  I will see her back in this clinic in 12 weeks or earlier if there is a problem.  Laurette Schimke, MD Allergy / Immunology [facility]

## 2020-05-06 NOTE — Progress Notes (Signed)
Pneumoccoccal vaccination given (L) deltoid at 11:31 a.m. lot number: [id number] and Exp. Date: 07/08/2020. Patient tolerated injection well with no problems.

## 2020-05-06 NOTE — Patient Instructions (Addendum)
  1.  Continue to Treat and prevent inflammation of airway and skin:   A. metrocream applied to face 2 times per day  B. Montelukast 10 mg - 1 tablet 1 time per day  C. START Doxycycline 50 mg - 1 tablet 1 time per day (after GI evaluation)  2.  Treat and prevent reflux/LPR:   A. Consolidate caffeine consumption  B. Omeprazole 40 mg - 1 tablet 1 time per day  3. NEED GI evaluation for recurrent vomiting  4.  If needed:   A. Pataday - 1 drop each eye 1 time per day  5.  Pneumovax administered in clinic today.  6.  Repeat blood test in 4 weeks - Pneumo23 serotype assay, SPEP with reflex to immunofixation.    7. Return to clinic in 12 weeks or earlier if problem

## 2020-05-09 ENCOUNTER — Encounter: Payer: Self-pay | Admitting: Allergy and Immunology

## 2020-05-09 ENCOUNTER — Telehealth: Payer: Self-pay | Admitting: Allergy and Immunology

## 2020-05-09 NOTE — Telephone Encounter (Signed)
Pt request a general letter for work that states her dietary restrictions with gluten.

## 2020-05-10 NOTE — Telephone Encounter (Signed)
Will provide a letter for patient to keep on file at work regarding her gluten dietary restriction.

## 2020-05-10 NOTE — Telephone Encounter (Signed)
Please provide letter stating dietary restrictions with gluten

## 2020-05-10 NOTE — Telephone Encounter (Signed)
Spoke to patient and she saw the letter through MyChart she stated she didn't need the letter specifically for work just a letter in general when she goes to different places that she has a letter to show. She will print the letter and I also a sent letter to her home address.

## 2020-05-20 ENCOUNTER — Encounter: Payer: Self-pay | Admitting: Physician Assistant

## 2020-05-20 ENCOUNTER — Ambulatory Visit: Admitting: Physician Assistant

## 2020-05-20 VITALS — BP 104/60 | HR 73 | Ht 66.0 in | Wt 174.0 lb

## 2020-05-20 DIAGNOSIS — K5909 Other constipation: Secondary | ICD-10-CM

## 2020-05-20 DIAGNOSIS — R112 Nausea with vomiting, unspecified: Secondary | ICD-10-CM | POA: Diagnosis not present

## 2020-05-20 NOTE — Progress Notes (Signed)
Chief Complaint: Excessive vomiting  HPI:    Charlene Griffin is a 30 year old female with a past medical history as listed below including reflux, known to Dr. Myrtie Neither, who presents to clinic today with a complaint of excessive vomiting.    02/16/2016 patient saw Doug Sou in clinic for rectal bleeding.  Also having some pain at that time.  Family history of colon cancer in maternal grandfather.  At that time rectal exam with tender posterior anal fissure.  Treated with Nitroglycerin.    03/08/2019 seen in the ED for hemorrhoids.  She saw the urgent care initially who gave her an antibiotic and hydrocortisone cream and told her eventually that would need to be removed.  She came to the ER to have them removed.  Rectal exam revealed a 2.5 cm partially thrombosed hemorrhoid to the left of the anus.  She was told to follow-up with general surgery.    03/16/2019 patient seen in clinic and described a tender large external hemorrhoid.  She was referred to [facility] for I&D.  Also ordered a CT the abdomen pelvis for further evaluation of chronic left lower quadrant pain.    03/27/2019 CT of the abdomen pelvis showed no acute findings.  Mild increased colonic stool burden.  2 small liver lesions consistent with cysts and a small umbilical and supraumbilical fat-containing hernia.    02/07/2020 patient had an abdominal and chest x-ray due to nausea and vomiting with COVID.  This was normal.    05/06/2020 patient is seen in the allergy clinic and discussed recurrent regurgitation and vomiting on a daily basis.  Apparently this was happening even though she consolidated her caffeine consumption and was on a PPI.  That time told to follow-up with [place].    Today, the patient presents to clinic and tells me that she has always had issues with vomiting and would vomit 6 to 9 x 5 to 6 days out of the week for years.  She also had issues with skin lesions and ulcers in her mouth etc.  She has been following with an allergy doctor  and they told her to go on a gluten-free diet which she did at the end of February.  Since then all of her symptoms have been decreased and actually her chronic constipation is better as well.  Now she only vomits 1 to 2 days out of the week and thinks this may be related from her chronic pain issues stemming from her hips.  She has been on Omeprazole 40 mg daily but does not feel like this makes any difference.  The greatest difference is that she went a gluten-free diet.    Denies fever, chills, blood in her stool or weight loss.  Past Medical History:  Diagnosis Date  . ADHD   . Alopecia   . Anxiety   . Bronchitis   . Complication of anesthesia    intubation bronchitis  . GERD (gastroesophageal reflux disease)    rare  . Hemorrhoids   . Immune deficiency disorder (HCC)   . Insomnia   . Major depression   . Migraine with aura   . Panic attack    with surgery  . PCOS (polycystic ovarian syndrome)   . PONV (postoperative nausea and vomiting)   . Tinnitus   . Umbilical hernia     Past Surgical History:  Procedure Laterality Date  . HERNIA REPAIR     x 2  . INCISIONAL HERNIA REPAIR N/A 09/02/2019   Procedure: LAPAROSCOPIC  INCISIONAL HERNIA REPAIR WITH MESH;  Surgeon: Kinsinger, De Blanch, MD;  Location: [facility];  Service: General;  Laterality: N/A;  . LAPAROSCOPY N/A 12/29/2018   Procedure: LAPAROSCOPY DIAGNOSTIC;  Surgeon: Romualdo Bolk, MD;  Location: [facility];  Service: Gynecology;  Laterality: N/A;  1 hour surgery time/ follow Dr Rica Records case  . LEFT OOPHORECTOMY    . OVARIAN CYST REMOVAL Left 12/29/2018   Procedure: LAPARASCOPIC OOPHARECTOMY;  Surgeon: Romualdo Bolk, MD;  Location: [facility];  Service: Gynecology;  Laterality: Left;  . WISDOM TOOTH EXTRACTION      Current Outpatient Medications  Medication Sig Dispense Refill  . albuterol (PROVENTIL) (2.5 MG/3ML) 0.083% nebulizer solution Take 3 mLs (2.5 mg total) by  nebulization every 6 (six) hours as needed for wheezing or shortness of breath. 75 mL 12  . albuterol (VENTOLIN HFA) 108 (90 Base) MCG/ACT inhaler Inhale 1 puff into the lungs every 6 (six) hours as needed for wheezing or shortness of breath.    . doxycycline (VIBRAMYCIN) 50 MG capsule Take 1 capsule (50 mg total) by mouth daily. 30 capsule 1  . estradiol (ESTRACE) 0.1 MG/GM vaginal cream Place vaginally.    . metroNIDAZOLE (METROCREAM) 0.75 % cream Apply to face twice daily as directed. 45 g 5  . montelukast (SINGULAIR) 10 MG tablet Take 1 tablet (10 mg total) by mouth at bedtime. 30 tablet 5  . Olopatadine HCl 0.2 % SOLN Can use one drop in each eye once daily if needed. 2.5 mL 5  . omeprazole (PRILOSEC) 40 MG capsule Take one capsule by mouth once daily. 30 capsule 5  . Rimegepant Sulfate (NURTEC) 75 MG TBDP Take 75 mg by mouth daily as needed (migraines).     Burr Medico 150-35 MCG/24HR transdermal patch 1 patch once a week.     No current facility-administered medications for this visit.    Allergies as of 05/20/2020 - Review Complete 05/09/2020  Allergen Reaction Noted  . Botox [botulinum toxin type a] Rash and Other (See Comments) 02/14/2016    Family History  Problem Relation Age of Onset  . Colon cancer Maternal Grandfather        passed away 2012-05-25  . Healthy Mother   . Healthy Father   . Asthma Sister   . Factor V Leiden deficiency Sister   . Ovarian cancer Paternal Grandmother     Social History   Socioeconomic History  . Marital status: Married    Spouse name: Not on file  . Number of children: Not on file  . Years of education: Not on file  . Highest education level: Not on file  Occupational History  . Not on file  Tobacco Use  . Smoking status: Current Some Day Smoker    Types: E-cigarettes  . Smokeless tobacco: Never Used  Vaping Use  . Vaping Use: Some days  . Substances: Nicotine, Flavoring  Substance and Sexual Activity  . Alcohol use: Yes    Comment:  socially  . Drug use: Never  . Sexual activity: Yes    Birth control/protection: Patch  Other Topics Concern  . Not on file  Social History Narrative  . Not on file   Social Determinants of Health   Financial Resource Strain: Not on file  Food Insecurity: Not on file  Transportation Needs: Not on file  Physical Activity: Not on file  Stress: Not on file  Social Connections: Not on file  Intimate Partner Violence: Not on file  Review of Systems:    Constitutional: No weight loss, fever or chills Skin: No rash  Cardiovascular: No chest pain Respiratory: No SOB Gastrointestinal: See HPI and otherwise negative   Physical Exam:  Vital signs: BP 104/60   Pulse 73   Ht 5\' 6"  (1.676 m)   Wt 174 lb (78.9 kg)   BMI 28.08 kg/m   Constitutional:   Pleasant Caucasian female appears to be in NAD, Well developed, Well nourished, alert and cooperative Respiratory: Respirations even and unlabored. Lungs clear to auscultation bilaterally.   No wheezes, crackles, or rhonchi.  Cardiovascular: Normal S1, S2. No MRG. Regular rate and rhythm. No peripheral edema, cyanosis or pallor.  Gastrointestinal:  Soft, nondistended, nontender. No rebound or guarding. Normal bowel sounds. No appreciable masses or hepatomegaly. Psychiatric: Oriented to person, place and time. Demonstrates good judgement and reason without abnormal affect or behaviors.  RELEVANT LABS AND IMAGING: CBC    Component Value Date/Time   WBC 8.4 04/11/2020 1520   WBC 4.2 02/07/2020 1531   RBC 4.20 04/11/2020 1520   RBC 4.33 02/07/2020 1531   HGB 13.1 04/11/2020 1520   HCT 38.0 04/11/2020 1520   PLT 312 04/11/2020 1520   MCV 91 04/11/2020 1520   MCH 31.2 04/11/2020 1520   MCH 30.3 02/07/2020 1531   MCHC 34.5 04/11/2020 1520   MCHC 34.4 02/07/2020 1531   RDW 12.9 04/11/2020 1520   LYMPHSABS 2.6 04/11/2020 1520   MONOABS 0.5 07/24/2019 1445   EOSABS 0.2 04/11/2020 1520   BASOSABS 0.0 04/11/2020 1520    CMP      Component Value Date/Time   NA 135 02/07/2020 1531   K 3.7 02/07/2020 1531   CL 99 02/07/2020 1531   CO2 23 02/07/2020 1531   GLUCOSE 90 02/07/2020 1531   BUN 12 02/07/2020 1531   CREATININE 0.66 02/07/2020 1531   CREATININE 0.80 07/24/2019 1445   CALCIUM 9.7 02/07/2020 1531   PROT 8.1 02/07/2020 1531   ALBUMIN 4.6 02/07/2020 1531   AST 25 02/07/2020 1531   AST 18 07/24/2019 1445   ALT 30 02/07/2020 1531   ALT 18 07/24/2019 1445   ALKPHOS 35 (L) 02/07/2020 1531   BILITOT 0.7 02/07/2020 1531   BILITOT 1.1 07/24/2019 1445   GFRNONAA >60 02/07/2020 1531   GFRNONAA >60 07/24/2019 1445   GFRAA >60 09/06/2019 1742   GFRAA >60 07/24/2019 1445    Assessment: 1.  Nausea and vomiting: Much better after gluten-free diet; consider celiac disease versus other 2.  Chronic constipation: Better with above  Plan: 1.  Patient does not wish to go back on Gluten.  Told her that since she has been on a gluten-free diet for months it may be hard for 07/26/2019 to actually diagnose celiac disease but likely she has it or at least a gluten sensitivity given that all of her symptoms are better now. 2.  [place] ahead and scheduled patient for diagnostic EGD in the [facility] with Dr. Micah Flesher.  Did provide patient with a detailed list of risks for the procedure and she agrees to proceed. 3.  Discussed that she can discontinue Omeprazole for now if she would like.  At time of EGD we will be better able to ascertain if she needs to stay on an acid reducer in the future. 4.  Patient to follow in clinic per recommendations from Dr. Myrtie Neither after time of procedure  Myrtie Neither, PA-C [facility] 05/20/2020, 9:59 AM  Cc: 05/22/2020, MD

## 2020-05-20 NOTE — Progress Notes (Signed)
____________________________________________________________  Attending physician addendum:  Thank you for sending this case to me. I have reviewed the entire note and agree with the plan.  Agree it may be difficult to know with certainty if truly celiac sprue with testing done on diet. Regardless, since she intends to remain on a gluten-free diet, she should be seen by a dietician to be sure her diet has sufficient vitamin, minerals and micronutrients.  Amada Jupiter, MD  ____________________________________________________________

## 2020-05-20 NOTE — Patient Instructions (Signed)
If you are age 30 or older, your body mass index should be between 23-30. Your Body mass index is 28.08 kg/m. If this is out of the aforementioned range listed, please consider follow up with your Primary Care Provider.  If you are age 53 or younger, your body mass index should be between 19-25. Your Body mass index is 28.08 kg/m. If this is out of the aformentioned range listed, please consider follow up with your Primary Care Provider.   You have been scheduled for an endoscopy. Please follow written instructions given to you at your visit today. If you use inhalers (even only as needed), please bring them with you on the day of your procedure.  Due to recent changes in healthcare laws, you may see the results of your imaging and laboratory studies on MyChart before your provider has had a chance to review them.  We understand that in some cases there may be results that are confusing or concerning to you. Not all laboratory results come back in the same time frame and the provider may be waiting for multiple results in order to interpret others.  Please give [place] 48 hours in order for your provider to thoroughly review all the results before contacting the office for clarification of your results.   Thank you for choosing me and [facility].  Hyacinth Meeker, PA-C

## 2020-06-02 ENCOUNTER — Telehealth: Payer: Self-pay

## 2020-06-02 DIAGNOSIS — R112 Nausea with vomiting, unspecified: Secondary | ICD-10-CM

## 2020-06-02 DIAGNOSIS — Z789 Other specified health status: Secondary | ICD-10-CM

## 2020-06-02 NOTE — Telephone Encounter (Signed)
-----   Message from Unk Lightning, [place] sent at 06/02/2020  8:39 AM EDT ----- Regarding: Can you refer to dietician Please refer to dietician for guidance on gluten fre diet and to ensure she is getting proper nutrients, etc.  Thanks-JLL ----- Message ----- From: Sherrilyn Rist, MD Sent: 05/20/2020   3:42 PM EDT To: Unk Lightning, PA    ----- Message ----- From: Unk Lightning, PA Sent: 05/20/2020  10:27 AM EDT To: Sherrilyn Rist, MD

## 2020-06-02 NOTE — Telephone Encounter (Signed)
Spoke with patient, she states that this was not mentioned at her visit. Advised that the note was reviewed by MD and dietician was one of the recommendations. Patient states that she did not want to miss work for someone to tell her about her diet. She has been provided with the phone number to [facility]. Patient had no further concerns at the end of the call.

## 2020-06-02 NOTE — Telephone Encounter (Signed)
Ambulatory referral to Nutrition and diabetes services in [place].   Lm on vm for patient to return call.

## 2020-06-07 ENCOUNTER — Telehealth: Payer: Self-pay | Admitting: Allergy and Immunology

## 2020-06-07 DIAGNOSIS — B999 Unspecified infectious disease: Secondary | ICD-10-CM

## 2020-06-07 NOTE — Telephone Encounter (Signed)
I have ordered the lab can you please go into the labs under her chart review and print it and inform pt to pick it up in [place].She does need to wait 6 weeks after the vaccine was administered to have lab drawn. Thank you!

## 2020-06-07 NOTE — Telephone Encounter (Signed)
Patient stating it has been 4 weeks needs an order set for blood work requested by Dr Lucie Leather for Pneumo 23 serotypes assay also patient states she is breaking out in Hives not sure the cause asking for advise please advise Patient has pictures if needed

## 2020-06-08 ENCOUNTER — Encounter: Payer: Self-pay | Admitting: Allergy and Immunology

## 2020-06-16 ENCOUNTER — Telehealth: Payer: Self-pay | Admitting: Gastroenterology

## 2020-06-16 NOTE — Telephone Encounter (Signed)
Patient is needing Prep instructions.  She states she can not find in my chart. Please call patient to go over Prep.

## 2020-06-16 NOTE — Telephone Encounter (Signed)
Called patient and made sure she was able to see prep instructions that were sent through a mychart message. Patient confirmed that she received mychart message.

## 2020-06-21 ENCOUNTER — Telehealth: Payer: Self-pay | Admitting: Gastroenterology

## 2020-06-21 NOTE — Telephone Encounter (Signed)
Patient called states she gets panic attacks prior to having any procedures and would like to make sure [facility] is aware for the procedure tomorrow not sure if it is listed in her chart.

## 2020-06-21 NOTE — Telephone Encounter (Signed)
Returned pts call.  Informed her that this is listed in her chart.

## 2020-06-22 ENCOUNTER — Ambulatory Visit (AMBULATORY_SURGERY_CENTER): Admitting: Gastroenterology

## 2020-06-22 ENCOUNTER — Other Ambulatory Visit: Payer: Self-pay

## 2020-06-22 ENCOUNTER — Encounter: Payer: Self-pay | Admitting: Gastroenterology

## 2020-06-22 VITALS — BP 120/72 | HR 75 | Temp 98.2°F | Resp 20 | Ht 66.0 in | Wt 174.0 lb

## 2020-06-22 DIAGNOSIS — R112 Nausea with vomiting, unspecified: Secondary | ICD-10-CM

## 2020-06-22 MED ORDER — SODIUM CHLORIDE 0.9 % IV SOLN
500.0000 mL | Freq: Once | INTRAVENOUS | Status: DC
Start: 2020-06-22 — End: 2020-06-22

## 2020-06-22 NOTE — Progress Notes (Signed)
Called to room to assist during endoscopic procedure.  Patient ID and intended procedure confirmed with present staff. Received instructions for my participation in the procedure from the performing physician.  

## 2020-06-22 NOTE — Progress Notes (Signed)
A/ox3, pleased with MAC, report to RN 

## 2020-06-22 NOTE — Patient Instructions (Signed)
YOU HAD AN ENDOSCOPIC PROCEDURE TODAY AT THE [facility]:   Refer to the procedure report that was given to you for any specific questions about what was found during the examination.  If the procedure report does not answer your questions, please call your gastroenterologist to clarify.  If you requested that your care partner not be given the details of your procedure findings, then the procedure report has been included in a sealed envelope for you to review at your convenience later.  YOU SHOULD EXPECT: Some feelings of bloating in the abdomen. Passage of more gas than usual.  Walking can help get rid of the air that was put into your GI tract during the procedure and reduce the bloating. If you had a lower endoscopy (such as a colonoscopy or flexible sigmoidoscopy) you may notice spotting of blood in your stool or on the toilet paper. If you underwent a bowel prep for your procedure, you may not have a normal bowel movement for a few days.  Please Note:  You might notice some irritation and congestion in your nose or some drainage.  This is from the oxygen used during your procedure.  There is no need for concern and it should clear up in a day or so.  SYMPTOMS TO REPORT IMMEDIATELY:   Following lower endoscopy (colonoscopy or flexible sigmoidoscopy):  Excessive amounts of blood in the stool  Significant tenderness or worsening of abdominal pains  Swelling of the abdomen that is new, acute  Fever of 100F or higher   Following upper endoscopy (EGD)  Vomiting of blood or coffee ground material  New chest pain or pain under the shoulder blades  Painful or persistently difficult swallowing  New shortness of breath  Fever of 100F or higher  Black, tarry-looking stools  For urgent or emergent issues, a gastroenterologist can be reached at any hour by calling [phone]. Do not use MyChart messaging for urgent concerns.    DIET:  We do recommend a small meal at first, but  then you may proceed to your regular diet.  Drink plenty of fluids but you should avoid alcoholic beverages for 24 hours.  ACTIVITY:  You should plan to take it easy for the rest of today and you should NOT DRIVE or use heavy machinery until tomorrow (because of the sedation medicines used during the test).    FOLLOW UP: Our staff will call the number listed on your records 48-72 hours following your procedure to check on you and address any questions or concerns that you may have regarding the information given to you following your procedure. If we do not reach you, we will leave a message.  We will attempt to reach you two times.  During this call, we will ask if you have developed any symptoms of COVID 19. If you develop any symptoms (ie: fever, flu-like symptoms, shortness of breath, cough etc.) before then, please call [phone].  If you test positive for Covid 19 in the 2 weeks post procedure, please call and report this information to us.    If any biopsies were taken you will be contacted by phone or by letter within the next 1-3 weeks.  Please call us at [phone] if you have not heard about the biopsies in 3 weeks.    SIGNATURES/CONFIDENTIALITY: You and/or your care partner have signed paperwork which will be entered into your electronic medical record.  These signatures attest to the fact that that the information above on   your After Visit Summary has been reviewed and is understood.  Full responsibility of the confidentiality of this discharge information lies with you and/or your care-partner. 

## 2020-06-22 NOTE — Op Note (Signed)
[facility] Patient Name: Charlene Griffin Procedure Date: 06/22/2020 4:32 PM MRN: [id number] Endoscopist: Sherilyn Cooter L. Myrtie Neither , MD Age: 30 Referring MD:  Date of Birth: [birth year] Gender: Female Account #: [id number] Procedure:                Upper GI endoscopy Indications:              Nausea with vomiting                           Years of symptoms that patient reports are                            triggered by chronic pain and anxiety                           Had allergy evaluation for skin condition, went on                            gluten free diet with improvement in that and                            vomiting. Negative celiac antibodies after 7 weeks                            on gluten-free diet Medicines:                Monitored Anesthesia Care Procedure:                Pre-Anesthesia Assessment:                           - Prior to the procedure, a History and Physical                            was performed, and patient medications and                            allergies were reviewed. The patient's tolerance of                            previous anesthesia was also reviewed. The risks                            and benefits of the procedure and the sedation                            options and risks were discussed with the patient.                            All questions were answered, and informed consent                            was obtained. Prior Anticoagulants: The patient has  taken no previous anticoagulant or antiplatelet                            agents. ASA Grade Assessment: II - A patient with                            mild systemic disease. After reviewing the risks                            and benefits, the patient was deemed in                            satisfactory condition to undergo the procedure.                           After obtaining informed consent, the endoscope was                             passed under direct vision. Throughout the                            procedure, the patient's blood pressure, pulse, and                            oxygen saturations were monitored continuously. The                            Endoscope was introduced through the mouth, and                            advanced to the second part of duodenum. The upper                            GI endoscopy was accomplished without difficulty.                            The patient tolerated the procedure well. Scope In: Scope Out: Findings:                 The larynx was normal.                           The esophagus was normal.                           The stomach was normal.                           The cardia and gastric fundus were normal on                            retroflexion.                           The examined duodenum was normal. Biopsies for  histology were taken with a cold forceps for                            evaluation of celiac disease. Complications:            No immediate complications. Estimated Blood Loss:     Estimated blood loss was minimal. Impression:               - Normal larynx.                           - Normal esophagus.                           - Normal stomach.                           - Normal examined duodenum. Biopsied.                           No inflammatory or obstructive cause for nausea and                            vomiting was seen on this exam.                           Unknown if true celiac sprue since patient on                            gluten free diet Recommendation:           - Patient has a contact number available for                            emergencies. The signs and symptoms of potential                            delayed complications were discussed with the                            patient. Return to normal activities tomorrow.                            Written discharge instructions were provided to  the                            patient.                           - Resume previous diet.                           - Discontinue PPIs. These symptoms are not                            consistent with GERD, and patient reports vomting  has significantly subsided as noted above.                           - Await pathology results. Likely normal.                           - Consult with a dietician to ensure sufficient                            macro and micro nutrients if remaining on                            gluten-free diet. Husain Costabile L. Myrtie Neitheranis, MD 06/22/2020 5:06:03 PM This report has been signed electronically.

## 2020-06-24 ENCOUNTER — Telehealth: Payer: Self-pay | Admitting: *Deleted

## 2020-06-24 NOTE — Telephone Encounter (Signed)
  Follow up Call-  Call back number 06/22/2020  Post procedure Call Back phone  # [phone]  Permission to leave phone message Yes  Some recent data might be hidden     Patient questions:  Do you have a fever, pain , or abdominal swelling? No. Pain Score  0 *  Have you tolerated food without any problems? Yes.    Have you been able to return to your normal activities? Yes.    Do you have any questions about your discharge instructions: Diet   No. Medications  No. Follow up visit  No.  Do you have questions or concerns about your Care? No.  Actions: * If pain score is 4 or above: No action needed, pain <4.Have you developed a fever since your procedure? no  2.   Have you had an respiratory symptoms (SOB or cough) since your procedure? no  3.   Have you tested positive for COVID 19 since your procedure no  4.   Have you had any family members/close contacts diagnosed with the COVID 19 since your procedure?  no   If yes to any of these questions please route to Laverna Peace, RN and Karlton Lemon, RN

## 2020-06-27 ENCOUNTER — Encounter: Payer: Self-pay | Admitting: Gastroenterology

## 2020-07-14 ENCOUNTER — Encounter: Payer: Self-pay | Admitting: Skilled Nursing Facility1

## 2020-07-14 ENCOUNTER — Encounter: Attending: Physician Assistant | Admitting: Skilled Nursing Facility1

## 2020-07-14 ENCOUNTER — Other Ambulatory Visit: Payer: Self-pay

## 2020-07-14 DIAGNOSIS — R112 Nausea with vomiting, unspecified: Secondary | ICD-10-CM | POA: Insufficient documentation

## 2020-07-14 NOTE — Progress Notes (Signed)
Medical Nutrition Therapy   Primary concerns today: weight  Referral diagnosis: r11.2 Preferred learning style:auditory Learning readiness: change in progress   NUTRITION ASSESSMENT   Anthropometrics   Pt states she went off of gluten in February and then had the celiac test which interfered with results. Pt states she throws up with pain or stress. Pt her doctor ruled out GERD. Pt states there was a biopsy to check her celiac. Pt states if she does have gluten she has GI issues and rash on her skin.   Body Composition Scale 07/14/2020  Current Body Weight 174.3  Total Body Fat % 31.6  Visceral Fat 6  Fat-Free Mass % 68.3   Total Body Water % 48.6  Muscle-Mass lbs 33.8  BMI 28  Body Fat Displacement          Torso  lbs 34.1         Left Leg  lbs 6.8         Right Leg  lbs 6.8         Left Arm  lbs 3.4         Right Arm   lbs 3.4    Clinical Medical Hx: PCOS, celiac disease  Medications:  Labs: WNL Notable Signs/Symptoms: pain  Lifestyle & Dietary Hx  Pt states she craves candy and has trouble controlling herself on it. Pt states she is a huge beer drinker (enjoys it not over consumes it. Pt states she does get migraines.   Estimated daily fluid intake: 64+ oz Supplements: none currently  Sleep:  Stress / self-care: high anxiety Current average weekly physical activity: ADL's, PT 5 days a week   60-YT Dietary Recall First Meal: yogurt or greek yogurt + honey or banana Snack: rice cakes + peanut butter + jelly + dark chocolate  Second Meal: gluten free chicken noodle soup Snack: caramel candies  Third Meal: non starchy vegetable + chicken or pork + sometimes rice or corn tortilla tacos + cheese + lettuce + tomato  Snack:  Beverages: kambucha, water, gluten free beer. Water + flavoring, unsweet tea, watered down arnold palmer, mango puree in tea, oatmilk latte  Estimated Energy Needs Calories: 1500   NUTRITION INTERVENTION  Nutrition education (E-1) on the  following topics:  Celiac and micronutrient deficiencies  Emotional Eating with anxiety Emesis and micronutrient deficiencies   Handouts Provided Include  N/A  Learning Style & Readiness for Change Teaching method utilized: Visual & Auditory  Demonstrated degree of understanding via: Teach Back  Barriers to learning/adherence to lifestyle change: anxiety  Goals Established by Pt Ask your doctor if inositol is appropriate for you  Be sure to replenish what you have lost if vomiting using your fruit machine thing Continue with nutritious foods such as fruits, vegetables, lean proteins, and complex gluten free carbohydrates  Start taking a gluten free prenatal vitamin again Speak with your therapist about physical manifestation of anxiety namely overeating at night specifically "goodies"   MONITORING & EVALUATION Dietary intake, weekly physical activity  Next Steps  Patient is to call as needed.

## 2020-07-19 ENCOUNTER — Ambulatory Visit: Admitting: Allergy and Immunology

## 2020-07-19 LAB — STREP PNEUMONIAE 23 SEROTYPES IGG
Pneumo Ab Type 1*: >14.2 ug/mL (ref >1.3)
Pneumo Ab Type 12 (12F)*: 1.5 ug/mL (ref >1.3)
Pneumo Ab Type 14*: >18.7 ug/mL (ref >1.3)
Pneumo Ab Type 17 (17F)*: 6.1 ug/mL (ref >1.3)
Pneumo Ab Type 19 (19F)*: 22.9 ug/mL (ref >1.3)
Pneumo Ab Type 2*: >20.7 ug/mL (ref >1.3)
Pneumo Ab Type 20*: 7.1 ug/mL (ref >1.3)
Pneumo Ab Type 22 (22F)*: 4.9 ug/mL (ref >1.3)
Pneumo Ab Type 23 (23F)*: 6.8 ug/mL (ref >1.3)
Pneumo Ab Type 26 (6B)*: >41.4 ug/mL (ref >1.3)
Pneumo Ab Type 3*: 15 ug/mL (ref >1.3)
Pneumo Ab Type 34 (10A)*: 4.8 ug/mL (ref >1.3)
Pneumo Ab Type 4*: >8.3 ug/mL (ref >1.3)
Pneumo Ab Type 43 (11A)*: 4.3 ug/mL (ref >1.3)
Pneumo Ab Type 5*: >47.3 ug/mL (ref >1.3)
Pneumo Ab Type 51 (7F)*: 6.8 ug/mL (ref >1.3)
Pneumo Ab Type 54 (15B)*: >22 ug/mL (ref >1.3)
Pneumo Ab Type 56 (18C)*: >8.1 ug/mL (ref >1.3)
Pneumo Ab Type 57 (19A)*: >33.6 ug/mL (ref >1.3)
Pneumo Ab Type 68 (9V)*: >13.4 ug/mL (ref >1.3)
Pneumo Ab Type 70 (33F)*: >10.1 ug/mL (ref >1.3)
Pneumo Ab Type 8*: >25.3 ug/mL (ref >1.3)
Pneumo Ab Type 9 (9N)*: >21.7 ug/mL (ref >1.3)

## 2020-07-25 NOTE — Progress Notes (Signed)
Please have Charlene Griffin start the doxycycline.

## 2020-08-10 ENCOUNTER — Ambulatory Visit
Admission: EM | Admit: 2020-08-10 | Discharge: 2020-08-10 | Disposition: A | Discharge status: Home / Self Care | Attending: Emergency Medicine | Admitting: Emergency Medicine

## 2020-08-10 DIAGNOSIS — N760 Acute vaginitis: Secondary | ICD-10-CM | POA: Insufficient documentation

## 2020-08-10 LAB — POCT URINALYSIS DIP (MANUAL ENTRY)
Bilirubin, UA: NEGATIVE
Blood, UA: NEGATIVE
Glucose, UA: NEGATIVE mg/dL
Ketones, POC UA: NEGATIVE mg/dL
Leukocytes, UA: NEGATIVE
Nitrite, UA: NEGATIVE
Protein Ur, POC: NEGATIVE mg/dL
Spec Grav, UA: 1.01 (ref 1.010–1.025)
Urobilinogen, UA: 0.2 E.U./dL
pH, UA: 6 (ref 5.0–8.0)

## 2020-08-10 LAB — POCT URINE PREGNANCY: Preg Test, Ur: NEGATIVE

## 2020-08-10 MED ORDER — CLOTRIMAZOLE-BETAMETHASONE 1-0.05 % EX CREA: TOPICAL_CREAM | CUTANEOUS | 0 refills | Status: DC

## 2020-08-10 MED ORDER — FLUCONAZOLE 150 MG PO TABS: 150.0000 mg | ORAL_TABLET | Freq: Once | ORAL | 0 refills | Status: AC

## 2020-08-10 NOTE — Discharge Instructions (Addendum)
Take 1 tab of Diflucan today, repeat in 3 days if still having symptoms Lotrisone cream to help with external irritation Let area air out Swab pending-we will call with results Follow-up if not improving or worsening

## 2020-08-10 NOTE — ED Provider Notes (Signed)
[facility]    CSN: [id number] Arrival date & time: 08/10/20  1450      History   Chief Complaint No chief complaint on file. Vaginal discharge  HPI Charlene Griffin is a 30 y.o. female presenting today for evaluation of possible UTI/yeast.  Reports worsening dysuria, vaginal irritation and discharge over the past few days.  Reports that she has history of pelvic floor dysfunction and frequently will have some discomfort.  She has noticed a thicker tissue like discharge in the vulvar area with significant itching.  Has had a yeast in the past, has been trying to apply topical yeast cream.  HPI  Past Medical History:  Diagnosis Date   ADHD    Allergy    Alopecia    Anxiety    Bronchitis    Celiac disease    Complication of anesthesia    intubation bronchitis   GERD (gastroesophageal reflux disease)    rare   Hemorrhoids    Immune deficiency disorder (HCC)    Insomnia    Major depression    Migraine with aura    Panic attack    with surgery   PCOS (polycystic ovarian syndrome)    PONV (postoperative nausea and vomiting)    Tinnitus    Umbilical hernia     Patient Active Problem List   Diagnosis Date Noted   Incisional hernia 09/02/2019   Migraine with aura    Non-seasonal allergic rhinitis 04/04/2018   Tinnitus of both ears 04/04/2018   Alopecia areata 02/08/2017   Enlarged thyroid gland 02/08/2017   Anxiety 12/11/2015   Attention deficit hyperactivity disorder (ADHD) 12/11/2015    Past Surgical History:  Procedure Laterality Date   HERNIA REPAIR     x 2   INCISIONAL HERNIA REPAIR N/A 09/02/2019   Procedure: LAPAROSCOPIC INCISIONAL HERNIA REPAIR WITH MESH;  Surgeon: Kinsinger, De Blanch, MD;  Location: [facility];  Service: General;  Laterality: N/A;   LAPAROSCOPY N/A 12/29/2018   Procedure: LAPAROSCOPY DIAGNOSTIC;  Surgeon: Romualdo Bolk, MD;  Location: [facility];  Service: Gynecology;  Laterality: N/A;  1 hour surgery  time/ follow Dr Rica Records case   LEFT OOPHORECTOMY     OVARIAN CYST REMOVAL Left 12/29/2018   Procedure: LAPARASCOPIC OOPHARECTOMY;  Surgeon: Romualdo Bolk, MD;  Location: [facility];  Service: Gynecology;  Laterality: Left;   WISDOM TOOTH EXTRACTION      OB History     Gravida  0   Para  0   Term  0   Preterm  0   AB  0   Living  0      SAB  0   IAB  0   Ectopic  0   Multiple  0   Live Births  0            Home Medications    Prior to Admission medications   Medication Sig Start Date End Date Taking? Authorizing Provider  clotrimazole-betamethasone (LOTRISONE) cream Apply to affected area 2 times daily 08/10/20  Yes Hiba Garry C, PA-C  albuterol (PROVENTIL) (2.5 MG/3ML) 0.083% nebulizer solution Take 3 mLs (2.5 mg total) by nebulization every 6 (six) hours as needed for wheezing or shortness of breath. 01/19/20   Bing Neighbors, FNP  albuterol (VENTOLIN HFA) 108 (90 Base) MCG/ACT inhaler Inhale 1 puff into the lungs every 6 (six) hours as needed for wheezing or shortness of breath. 02/18/19   [provider]  estradiol (  ESTRACE) 0.1 MG/GM vaginal cream Place vaginally. 03/28/20   [provider]  meloxicam (MOBIC) 7.5 MG tablet Take 7.5 mg by mouth daily.    [provider]  montelukast (SINGULAIR) 10 MG tablet Take 1 tablet (10 mg total) by mouth at bedtime. 04/08/20   Kozlow, Alvira Philips, MD  norethindrone (CAMILA) 0.35 MG tablet Take 0.35 mg by mouth daily.    [provider]  Olopatadine HCl 0.2 % SOLN Can use one drop in each eye once daily if needed. 04/08/20   Kozlow, Alvira Philips, MD  omeprazole (PRILOSEC) 40 MG capsule Take one capsule by mouth once daily. 04/08/20   Kozlow, Alvira Philips, MD  Rimegepant Sulfate (NURTEC) 75 MG TBDP Take 75 mg by mouth daily as needed (migraines).     [provider]    Family History Family History  Problem Relation Age of Onset   Colon cancer Maternal Grandfather         passed away 9   Healthy Mother    Healthy Father    Asthma Sister    Factor V Leiden deficiency Sister    Ovarian cancer Paternal Grandmother     Social History Social History   Tobacco Use   Smoking status: Some Days    Types: E-cigarettes   Smokeless tobacco: Never  Vaping Use   Vaping Use: Some days   Substances: Nicotine, Flavoring  Substance Use Topics   Alcohol use: Yes    Comment: socially   Drug use: Never     Allergies   Botox [botulinum toxin type a] and Gluten meal   Review of Systems Review of Systems  Constitutional:  Negative for fever.  Respiratory:  Negative for shortness of breath.   Cardiovascular:  Negative for chest pain.  Gastrointestinal:  Negative for abdominal pain, diarrhea, nausea and vomiting.  Genitourinary:  Positive for dysuria and vaginal discharge. Negative for flank pain, genital sores, hematuria, menstrual problem, vaginal bleeding and vaginal pain.  Musculoskeletal:  Negative for back pain.  Skin:  Negative for rash.  Neurological:  Negative for dizziness, light-headedness and headaches.    Physical Exam Triage Vital Signs ED Triage Vitals  Enc Vitals Group     BP      Pulse      Resp      Temp      Temp src      SpO2      Weight      Height      Head Circumference      Peak Flow      Pain Score      Pain Loc      Pain Edu?      Excl. in GC?    No data found.  Updated Vital Signs BP 127/64 (BP Location: Right Arm)   Pulse 67   Temp 98.3 F (36.8 C) (Oral)   Resp 20   LMP 06/17/2020 (Exact Date)   SpO2 98%   Visual Acuity Right Eye Distance:   Left Eye Distance:   Bilateral Distance:    Right Eye Near:   Left Eye Near:    Bilateral Near:     Physical Exam Vitals and nursing note reviewed.  Constitutional:      General: She is not in acute distress.    Appearance: She is well-developed.     Comments: No acute distress  HENT:     Head: Normocephalic and atraumatic.     Nose: Nose normal.   Eyes:  Conjunctiva/sclera: Conjunctivae normal.  Cardiovascular:     Rate and Rhythm: Normal rate and regular rhythm.     Heart sounds: No murmur heard. Pulmonary:     Effort: Pulmonary effort is normal. No respiratory distress.     Breath sounds: Normal breath sounds.  Abdominal:     General: There is no distension.     Palpations: Abdomen is soft.     Tenderness: There is no abdominal tenderness.  Genitourinary:    Comments: Vulva appears erythematous, moderate amount of white discharge present, vaginal mucosa pink, cervix pink without any erythema, nonfriable, small amount of white discharge present Musculoskeletal:        General: Normal range of motion.     Cervical back: Neck supple.  Skin:    General: Skin is warm and dry.  Neurological:     Mental Status: She is alert and oriented to person, place, and time.     UC Treatments / Results  Labs (all labs ordered are listed, but only abnormal results are displayed) Labs Reviewed  POCT URINALYSIS DIP (MANUAL ENTRY)  POCT URINE PREGNANCY  CERVICOVAGINAL ANCILLARY ONLY    EKG   Radiology No results found.  Procedures Procedures (including critical care time)  Medications Ordered in UC Medications - No data to display  Initial Impression / Assessment and Plan / UC Course  I have reviewed the triage vital signs and the nursing notes.  Pertinent labs & imaging results that were available during my care of the patient were reviewed by me and considered in my medical decision making (see chart for details).     Empirically treating for yeast vaginitis with Diflucan and topical Lotrisone externally, UA completely unremarkable, swab pending for further confirmation, will alter therapy as needed.  Discussed strict return precautions. Patient verbalized understanding and is agreeable with plan.  Final Clinical Impressions(s) / UC Diagnoses   Final diagnoses:  Vaginitis and vulvovaginitis     Discharge  Instructions      Take 1 tab of Diflucan today, repeat in 3 days if still having symptoms Lotrisone cream to help with external irritation Let area air out Swab pending-we will call with results Follow-up if not improving or worsening     ED Prescriptions     Medication Sig Dispense Auth. Provider   fluconazole (DIFLUCAN) 150 MG tablet Take 1 tablet (150 mg total) by mouth once for 1 dose. 2 tablet Pier Bosher C, PA-C   clotrimazole-betamethasone (LOTRISONE) cream Apply to affected area 2 times daily 15 g Jaydn Fincher, [place] C, PA-C      PDMP not reviewed this encounter.   Sharyon Cable Sherburn C, [place] 08/11/20 [phone]

## 2020-08-10 NOTE — ED Triage Notes (Signed)
Pt presents with c/o white vaginal discharge, vaginal bleeding, urinary frequency and vaginal odor and itching X 3 days.   States she applied vaginal cream and felt better.

## 2020-08-12 ENCOUNTER — Telehealth (HOSPITAL_COMMUNITY): Payer: Self-pay | Admitting: Emergency Medicine

## 2020-08-12 LAB — CERVICOVAGINAL ANCILLARY ONLY
Bacterial Vaginitis (gardnerella): POSITIVE — AB
Candida Glabrata: NEGATIVE
Candida Vaginitis: POSITIVE — AB
Chlamydia: NEGATIVE
Comment: NEGATIVE
Comment: NEGATIVE
Comment: NEGATIVE
Comment: NEGATIVE
Comment: NEGATIVE
Comment: NORMAL
Neisseria Gonorrhea: NEGATIVE
Trichomonas: NEGATIVE

## 2020-08-12 MED ORDER — METRONIDAZOLE 0.75 % VA GEL: 1.0000 | Freq: Every day | VAGINAL | 0 refills | Status: AC

## 2020-08-17 ENCOUNTER — Other Ambulatory Visit: Payer: Self-pay

## 2020-08-17 ENCOUNTER — Ambulatory Visit: Admission: EM | Admit: 2020-08-17 | Discharge: 2020-08-17 | Disposition: A | Discharge status: Home / Self Care

## 2020-08-17 ENCOUNTER — Encounter: Payer: Self-pay | Admitting: Emergency Medicine

## 2020-08-17 DIAGNOSIS — L639 Alopecia areata, unspecified: Secondary | ICD-10-CM

## 2020-08-17 DIAGNOSIS — T753XXA Motion sickness, initial encounter: Secondary | ICD-10-CM

## 2020-08-17 DIAGNOSIS — N898 Other specified noninflammatory disorders of vagina: Secondary | ICD-10-CM | POA: Diagnosis not present

## 2020-08-17 MED ORDER — FLUCONAZOLE 150 MG PO TABS: 150.0000 mg | ORAL_TABLET | Freq: Once | ORAL | 0 refills | Status: AC

## 2020-08-17 MED ORDER — SCOPOLAMINE 1 MG/3DAYS TD PT72: 1.0000 | MEDICATED_PATCH | TRANSDERMAL | 0 refills | Status: DC

## 2020-08-17 MED ORDER — CLOBETASOL PROPIONATE 0.05 % EX OINT: 1.0000 "application " | TOPICAL_OINTMENT | Freq: Two times a day (BID) | CUTANEOUS | 0 refills | Status: DC

## 2020-08-17 NOTE — Discharge Instructions (Addendum)
Repeat 1 tab of Diflucan to treat yeast infection Scopolamine patches for motion sickness prevention Clobetasol ointment for alopecia Follow-up for any further concerns

## 2020-08-17 NOTE — ED Triage Notes (Addendum)
Patient presents for follow up.  Patient was seen on 8/3 for vaginal discharge.   Patient was given Diflucan and Metronidazole insertion. Patient endorses "clumpy white discharge w/ little speaks of red this morning".   Patient endorses last dose of Metronidazole was last night.   Patient endorses foul smell and itching has resolved.   Patient has follow up questions.   Patient c/o alopecia for years.   Patient endorses a "hair loss flare" that occurred recently.   Clobetasole ointment.

## 2020-08-17 NOTE — ED Provider Notes (Signed)
[facility]    CSN: [id number] Arrival date & time: 08/17/20  1450      History   Chief Complaint Chief Complaint  Patient presents with   Alopecia   Follow-up    HPI Charlene Griffin is a 30 y.o. female presenting today for follow-up of vaginal discharge.  Was seen on 08/3 and vaginal swab positive for yeast and BV.  Last dose of MetroGel last night.  But began to have a clumpy white discharge with small red specks.  Denies itching or irritation.  She also reports a history of alopecia and reports that increased hair loss recently.  Reports that she is experienced this intermittently over the years.  Hair will grow back.  Has been treated with clobetasol ointment with relief in the past.  Reports going on a cruise this weekend and inquiring about motion sickness tablets.  HPI  Past Medical History:  Diagnosis Date   ADHD    Allergy    Alopecia    Anxiety    Bronchitis    Celiac disease    Complication of anesthesia    intubation bronchitis   GERD (gastroesophageal reflux disease)    rare   Hemorrhoids    Immune deficiency disorder (HCC)    Insomnia    Major depression    Migraine with aura    Panic attack    with surgery   PCOS (polycystic ovarian syndrome)    PONV (postoperative nausea and vomiting)    Tinnitus    Umbilical hernia     Patient Active Problem List   Diagnosis Date Noted   Incisional hernia 09/02/2019   Migraine with aura    Non-seasonal allergic rhinitis 04/04/2018   Tinnitus of both ears 04/04/2018   Alopecia areata 02/08/2017   Enlarged thyroid gland 02/08/2017   Anxiety 12/11/2015   Attention deficit hyperactivity disorder (ADHD) 12/11/2015    Past Surgical History:  Procedure Laterality Date   HERNIA REPAIR     x 2   INCISIONAL HERNIA REPAIR N/A 09/02/2019   Procedure: LAPAROSCOPIC INCISIONAL HERNIA REPAIR WITH MESH;  Surgeon: Kinsinger, De Blanch, MD;  Location: [facility];  Service: General;  Laterality: N/A;    LAPAROSCOPY N/A 12/29/2018   Procedure: LAPAROSCOPY DIAGNOSTIC;  Surgeon: Romualdo Bolk, MD;  Location: [facility] [place];  Service: Gynecology;  Laterality: N/A;  1 hour surgery time/ follow Dr Rica Records case   LEFT OOPHORECTOMY     OVARIAN CYST REMOVAL Left 12/29/2018   Procedure: LAPARASCOPIC OOPHARECTOMY;  Surgeon: Romualdo Bolk, MD;  Location: [facility];  Service: Gynecology;  Laterality: Left;   WISDOM TOOTH EXTRACTION      OB History     Gravida  0   Para  0   Term  0   Preterm  0   AB  0   Living  0      SAB  0   IAB  0   Ectopic  0   Multiple  0   Live Births  0            Home Medications    Prior to Admission medications   Medication Sig Start Date End Date Taking? Authorizing Provider  clobetasol ointment (TEMOVATE) 0.05 % Apply 1 application topically 2 (two) times daily. 08/17/20  Yes Chloeanne Poteet C, PA-C  estradiol (ESTRACE) 0.1 MG/GM vaginal cream Place vaginally. 03/28/20  Yes [provider]  meloxicam (MOBIC) 15 MG tablet Take 15 mg by mouth  daily. 08/12/20  Yes [provider]  montelukast (SINGULAIR) 10 MG tablet Take 1 tablet (10 mg total) by mouth at bedtime. 04/08/20  Yes Kozlow, Alvira Philips, MD  norethindrone (MICRONOR) 0.35 MG tablet Take 0.35 mg by mouth daily.   Yes [provider]  scopolamine (TRANSDERM-SCOP) 1 MG/3DAYS Place 1 patch (1.5 mg total) onto the skin every 3 (three) days. 08/17/20  Yes Zalma Channing C, PA-C  albuterol (PROVENTIL) (2.5 MG/3ML) 0.083% nebulizer solution Take 3 mLs (2.5 mg total) by nebulization every 6 (six) hours as needed for wheezing or shortness of breath. 01/19/20   Bing Neighbors, FNP  albuterol (VENTOLIN HFA) 108 (90 Base) MCG/ACT inhaler Inhale 1 puff into the lungs every 6 (six) hours as needed for wheezing or shortness of breath. 02/18/19   [provider]  clotrimazole-betamethasone (LOTRISONE) cream Apply to affected area 2 times  daily 08/10/20   Jamarques Pinedo C, PA-C  meloxicam (MOBIC) 7.5 MG tablet Take 7.5 mg by mouth daily.    [provider]  Olopatadine HCl 0.2 % SOLN Can use one drop in each eye once daily if needed. 04/08/20   Kozlow, Alvira Philips, MD  omeprazole (PRILOSEC) 40 MG capsule Take one capsule by mouth once daily. 04/08/20   Kozlow, Alvira Philips, MD  Rimegepant Sulfate (NURTEC) 75 MG TBDP Take 75 mg by mouth daily as needed (migraines).     [provider]    Family History Family History  Problem Relation Age of Onset   Colon cancer Maternal Grandfather        passed away 67   Healthy Mother    Healthy Father    Asthma Sister    Factor V Leiden deficiency Sister    Ovarian cancer Paternal Grandmother     Social History Social History   Tobacco Use   Smoking status: Some Days    Types: E-cigarettes   Smokeless tobacco: Never  Vaping Use   Vaping Use: Some days   Substances: Nicotine, Flavoring  Substance Use Topics   Alcohol use: Yes    Comment: socially   Drug use: Never     Allergies   Botox [botulinum toxin type a] and Gluten meal   Review of Systems Review of Systems  Constitutional:  Negative for fever.  Respiratory:  Negative for shortness of breath.   Cardiovascular:  Negative for chest pain.  Gastrointestinal:  Negative for abdominal pain, diarrhea, nausea and vomiting.  Genitourinary:  Positive for vaginal discharge. Negative for dysuria, flank pain, genital sores, hematuria, menstrual problem, vaginal bleeding and vaginal pain.  Musculoskeletal:  Negative for back pain.  Skin:  Negative for rash.  Neurological:  Negative for dizziness, light-headedness and headaches.    Physical Exam Triage Vital Signs ED Triage Vitals [08/17/20 1503]  Enc Vitals Group     BP      Pulse      Resp      Temp      Temp src      SpO2      Weight      Height      Head Circumference      Peak Flow      Pain Score 0     Pain Loc      Pain Edu?      Excl. in GC?     No data found.  Updated Vital Signs BP 114/64 (BP Location: Left Arm)   Pulse 84   Temp 98.6 F (37 C)  Resp 16   LMP 06/17/2020   SpO2 97%   Visual Acuity Right Eye Distance:   Left Eye Distance:   Bilateral Distance:    Right Eye Near:   Left Eye Near:    Bilateral Near:     Physical Exam Vitals and nursing note reviewed.  Constitutional:      Appearance: She is well-developed.     Comments: No acute distress  HENT:     Head: Normocephalic and atraumatic.     Nose: Nose normal.  Eyes:     Conjunctiva/sclera: Conjunctivae normal.  Cardiovascular:     Rate and Rhythm: Normal rate.  Pulmonary:     Effort: Pulmonary effort is normal. No respiratory distress.  Abdominal:     General: There is no distension.  Musculoskeletal:        General: Normal range of motion.     Cervical back: Neck supple.  Skin:    General: Skin is warm and dry.     Comments: Central frontal part of scalp with area of alopecia  Neurological:     Mental Status: She is alert and oriented to person, place, and time.     UC Treatments / Results  Labs (all labs ordered are listed, but only abnormal results are displayed) Labs Reviewed - No data to display  EKG   Radiology No results found.  Procedures Procedures (including critical care time)  Medications Ordered in UC Medications - No data to display  Initial Impression / Assessment and Plan / UC Course  I have reviewed the triage vital signs and the nursing notes.  Pertinent labs & imaging results that were available during my care of the patient were reviewed by me and considered in my medical decision making (see chart for details).     Vaginal discharge-begin after use of MetroGel, will empirically treat for yeast with repeat Diflucan doses Alopecia areata-clobetasol ointment refilled as this is helped patient in the past Motion sickness-provided scopolamine patches but also discussed other over-the-counter medicines  she may use  Discussed strict return precautions. Patient verbalized understanding and is agreeable with plan.  Final Clinical Impressions(s) / UC Diagnoses   Final diagnoses:  Vaginal discharge  Alopecia areata  Motion sickness, initial encounter     Discharge Instructions      Repeat 1 tab of Diflucan to treat yeast infection Scopolamine patches for motion sickness prevention Clobetasol ointment for alopecia Follow-up for any further concerns     ED Prescriptions     Medication Sig Dispense Auth. Provider   scopolamine (TRANSDERM-SCOP) 1 MG/3DAYS Place 1 patch (1.5 mg total) onto the skin every 3 (three) days. 10 patch Eliu Batch C, PA-C   fluconazole (DIFLUCAN) 150 MG tablet Take 1 tablet (150 mg total) by mouth once for 1 dose. 2 tablet Guy Seese C, PA-C   clobetasol ointment (TEMOVATE) 0.05 % Apply 1 application topically 2 (two) times daily. 60 g Cambri Plourde, [place] C, PA-C      PDMP not reviewed this encounter.   Lew Dawes, [place] 08/18/20 [phone]

## 2020-08-23 ENCOUNTER — Other Ambulatory Visit: Payer: Self-pay

## 2020-08-23 ENCOUNTER — Ambulatory Visit
Admission: EM | Admit: 2020-08-23 | Discharge: 2020-08-23 | Disposition: A | Discharge status: Home / Self Care | Attending: Emergency Medicine | Admitting: Emergency Medicine

## 2020-08-23 ENCOUNTER — Encounter: Payer: Self-pay | Admitting: Emergency Medicine

## 2020-08-23 DIAGNOSIS — J069 Acute upper respiratory infection, unspecified: Secondary | ICD-10-CM | POA: Diagnosis present

## 2020-08-23 LAB — POCT RAPID STREP A (OFFICE): Rapid Strep A Screen: NEGATIVE

## 2020-08-23 MED ORDER — CETIRIZINE HCL 10 MG PO CAPS: 10.0000 mg | ORAL_CAPSULE | Freq: Every day | ORAL | 0 refills | Status: DC

## 2020-08-23 MED ORDER — BENZONATATE 200 MG PO CAPS: 200.0000 mg | ORAL_CAPSULE | Freq: Three times a day (TID) | ORAL | 0 refills | Status: AC | PRN

## 2020-08-23 MED ORDER — ALBUTEROL SULFATE HFA 108 (90 BASE) MCG/ACT IN AERS: 1.0000 | INHALATION_SPRAY | Freq: Four times a day (QID) | RESPIRATORY_TRACT | 0 refills | Status: DC | PRN

## 2020-08-23 MED ORDER — DM-GUAIFENESIN ER 30-600 MG PO TB12: 1.0000 | ORAL_TABLET | Freq: Two times a day (BID) | ORAL | 0 refills | Status: DC

## 2020-08-23 MED ORDER — IPRATROPIUM-ALBUTEROL 0.5-2.5 (3) MG/3ML IN SOLN: 3.0000 mL | Freq: Four times a day (QID) | RESPIRATORY_TRACT | 0 refills | Status: DC | PRN

## 2020-08-23 NOTE — ED Provider Notes (Signed)
[facility]    CSN: [id number] Arrival date & time: 08/23/20  0813      History   Chief Complaint Chief Complaint  Patient presents with   Cough   Sore Throat   Nasal Congestion    HPI Charlene Griffin is a 30 y.o. female presenting today for evaluation of URI symptoms.  Reports associated cough and sore throat.  Symptoms began last night, but more prominent this morning.  Recently on a cruise.  Husband with similar symptoms.  No known COVID exposures.  Denies fevers, but has had some slight hot and cold chills.  Prone to developing bronchitis.  HPI  Past Medical History:  Diagnosis Date   ADHD    Allergy    Alopecia    Anxiety    Bronchitis    Celiac disease    Complication of anesthesia    intubation bronchitis   GERD (gastroesophageal reflux disease)    rare   Hemorrhoids    Immune deficiency disorder (HCC)    Insomnia    Major depression    Migraine with aura    Panic attack    with surgery   PCOS (polycystic ovarian syndrome)    PONV (postoperative nausea and vomiting)    Tinnitus    Umbilical hernia     Patient Active Problem List   Diagnosis Date Noted   Incisional hernia 09/02/2019   Migraine with aura    Non-seasonal allergic rhinitis 04/04/2018   Tinnitus of both ears 04/04/2018   Alopecia areata 02/08/2017   Enlarged thyroid gland 02/08/2017   Anxiety 12/11/2015   Attention deficit hyperactivity disorder (ADHD) 12/11/2015    Past Surgical History:  Procedure Laterality Date   HERNIA REPAIR     x 2   INCISIONAL HERNIA REPAIR N/A 09/02/2019   Procedure: LAPAROSCOPIC INCISIONAL HERNIA REPAIR WITH MESH;  Surgeon: Kinsinger, De BlanchLuke Aaron, MD;  Location: [facility];  Service: General;  Laterality: N/A;   LAPAROSCOPY N/A 12/29/2018   Procedure: LAPAROSCOPY DIAGNOSTIC;  Surgeon: Romualdo BolkJertson, Jill Evelyn, MD;  [facility] [place];  Service: Gynecology;  Laterality: N/A;  1 hour surgery time/ follow Dr Rica RecordsSilva's case   LEFT  OOPHORECTOMY     OVARIAN CYST REMOVAL Left 12/29/2018   Procedure: LAPARASCOPIC OOPHARECTOMY;  Surgeon: Romualdo BolkJertson, Jill Evelyn, MD;  [facility] [place];  Service: Gynecology;  Laterality: Left;   WISDOM TOOTH EXTRACTION      OB History     Gravida  0   Para  0   Term  0   Preterm  0   AB  0   Living  0      SAB  0   IAB  0   Ectopic  0   Multiple  0   Live Births  0            Home Medications    Prior to Admission medications   Medication Sig Start Date End Date Taking? Authorizing Provider  benzonatate (TESSALON) 200 MG capsule Take 1 capsule (200 mg total) by mouth 3 (three) times daily as needed for up to 7 days for cough. 08/23/20 08/30/20 Yes Lawton Dollinger C, PA-C  Cetirizine HCl 10 MG CAPS Take 1 capsule (10 mg total) by mouth daily for 10 days. 08/23/20 09/02/20 Yes Kyera Felan C, PA-C  dextromethorphan-guaiFENesin (MUCINEX DM) 30-600 MG 12hr tablet Take 1 tablet by mouth 2 (two) times daily. 08/23/20  Yes Luvenia Cranford C, PA-C  ipratropium-albuterol (DUONEB) 0.5-2.5 (3)  MG/3ML SOLN Take 3 mLs by nebulization every 6 (six) hours as needed. 08/23/20  Yes Marlen Koman C, PA-C  albuterol (VENTOLIN HFA) 108 (90 Base) MCG/ACT inhaler Inhale 1-2 puffs into the lungs every 6 (six) hours as needed for wheezing or shortness of breath. 08/23/20   Emony Dormer C, PA-C  clobetasol ointment (TEMOVATE) 0.05 % Apply 1 application topically 2 (two) times daily. 08/17/20   Paddy Walthall C, PA-C  clotrimazole-betamethasone (LOTRISONE) cream Apply to affected area 2 times daily 08/10/20   Jabaree Mercado, [employer] C, PA-C  estradiol (ESTRACE) 0.1 MG/GM vaginal cream Place vaginally. 03/28/20   [provider]  meloxicam (MOBIC) 15 MG tablet Take 15 mg by mouth daily. 08/12/20   [provider]  meloxicam (MOBIC) 7.5 MG tablet Take 7.5 mg by mouth daily.    [provider]  montelukast (SINGULAIR) 10 MG tablet Take 1 tablet (10 mg total) by  mouth at bedtime. 04/08/20   Kozlow, Alvira Philips, MD  norethindrone (MICRONOR) 0.35 MG tablet Take 0.35 mg by mouth daily.    [provider]  Olopatadine HCl 0.2 % SOLN Can use one drop in each eye once daily if needed. 04/08/20   Kozlow, Alvira Philips, MD  omeprazole (PRILOSEC) 40 MG capsule Take one capsule by mouth once daily. 04/08/20   Kozlow, Alvira Philips, MD  Rimegepant Sulfate (NURTEC) 75 MG TBDP Take 75 mg by mouth daily as needed (migraines).     [provider]  scopolamine (TRANSDERM-SCOP) 1 MG/3DAYS Place 1 patch (1.5 mg total) onto the skin every 3 (three) days. 08/17/20   Demont Linford, Junius Creamer, PA-C    Family History Family History  Problem Relation Age of Onset   Colon cancer Maternal Grandfather        passed away 1   Healthy Mother    Healthy Father    Asthma Sister    Factor V Leiden deficiency Sister    Ovarian cancer Paternal Grandmother     Social History Social History   Tobacco Use   Smoking status: Some Days    Types: E-cigarettes   Smokeless tobacco: Never  Vaping Use   Vaping Use: Some days   Substances: Nicotine, Flavoring  Substance Use Topics   Alcohol use: Yes    Comment: socially   Drug use: Never     Allergies   Botox [botulinum toxin type a] and Gluten meal   Review of Systems Review of Systems  Constitutional:  Negative for activity change, appetite change, chills, fatigue and fever.  HENT:  Positive for congestion, rhinorrhea and sore throat. Negative for ear pain, sinus pressure and trouble swallowing.   Eyes:  Negative for discharge and redness.  Respiratory:  Positive for cough. Negative for chest tightness and shortness of breath.   Cardiovascular:  Negative for chest pain.  Gastrointestinal:  Negative for abdominal pain, diarrhea, nausea and vomiting.  Musculoskeletal:  Negative for myalgias.  Skin:  Negative for rash.  Neurological:  Negative for dizziness, light-headedness and headaches.    Physical Exam Triage Vital Signs ED  Triage Vitals  Enc Vitals Group     BP      Pulse      Resp      Temp      Temp src      SpO2      Weight      Height      Head Circumference      Peak Flow      Pain Score  Pain Loc      Pain Edu?      Excl. in GC?    No data found.  Updated Vital Signs BP 119/68 (BP Location: Right Arm)   Pulse 95   Temp 97.9 F (36.6 C) (Oral)   Resp 18   LMP 06/17/2020   SpO2 98%   Visual Acuity Right Eye Distance:   Left Eye Distance:   Bilateral Distance:    Right Eye Near:   Left Eye Near:    Bilateral Near:     Physical Exam Vitals and nursing note reviewed.  Constitutional:      Appearance: She is well-developed.     Comments: No acute distress  HENT:     Head: Normocephalic and atraumatic.     Ears:     Comments: Bilateral ears without tenderness to palpation of external auricle, tragus and mastoid, EAC's without erythema or swelling, TM's with good bony landmarks and cone of light. Non erythematous.      Nose: Nose normal.     Mouth/Throat:     Comments: Oral mucosa pink and moist, no tonsillar enlargement or exudate. Posterior pharynx patent and nonerythematous, no uvula deviation or swelling. Normal phonation.  Eyes:     Conjunctiva/sclera: Conjunctivae normal.  Cardiovascular:     Rate and Rhythm: Normal rate.  Pulmonary:     Effort: Pulmonary effort is normal. No respiratory distress.     Comments: Breathing comfortably at rest, CTABL, no wheezing, rales or other adventitious sounds auscultated  Coarse cough/bronchospasm noted with cough  Abdominal:     General: There is no distension.  Musculoskeletal:        General: Normal range of motion.     Cervical back: Neck supple.  Skin:    General: Skin is warm and dry.  Neurological:     Mental Status: She is alert and oriented to person, place, and time.     UC Treatments / Results  Labs (all labs ordered are listed, but only abnormal results are displayed) Labs Reviewed  CULTURE, GROUP A  STREP ([facility])  NOVEL CORONAVIRUS, NAA  POCT RAPID STREP A (OFFICE)    EKG   Radiology No results found.  Procedures Procedures (including critical care time)  Medications Ordered in UC Medications - No data to display  Initial Impression / Assessment and Plan / UC Course  I have reviewed the triage vital signs and the nursing notes.  Pertinent labs & imaging results that were available during my care of the patient were reviewed by me and considered in my medical decision making (see chart for details).     Viral URI with cough, possible early bronchitis-strep negative, COVID pending, recommending symptomatic and supportive care rest and fluids, refilled albuterol inhaler or nebulizer use to help with any underlying developing bronchitis, Mucinex and Tessalon for cough and congestion, rest and fluids.  Discussed strict return precautions. Patient verbalized understanding and is agreeable with plan.  Final Clinical Impressions(s) / UC Diagnoses   Final diagnoses:  Viral URI with cough     Discharge Instructions      Strep test negative, COVID test pending, monitor MyChart for results Rest and fluids Tylenol and ibuprofen as needed for any headache, body aches, sore throat, fever Use albuterol inhaler or nebulizers as needed for shortness of breath chest tightness, wheezing Begin daily cetirizine to help with postnasal drainage/throat irritation Mucinex DM twice daily for congestion/cough Tessalon every 8 hours for cough Honey and hot tea Salt water gargles  Follow-up if not improving or worsening     ED Prescriptions     Medication Sig Dispense Auth. Provider   albuterol (VENTOLIN HFA) 108 (90 Base) MCG/ACT inhaler Inhale 1-2 puffs into the lungs every 6 (six) hours as needed for wheezing or shortness of breath. 1 each Finola Rosal C, PA-C   ipratropium-albuterol (DUONEB) 0.5-2.5 (3) MG/3ML SOLN Take 3 mLs by nebulization every 6 (six) hours as needed. 360 mL  Taleigh Gero C, PA-C   benzonatate (TESSALON) 200 MG capsule Take 1 capsule (200 mg total) by mouth 3 (three) times daily as needed for up to 7 days for cough. 28 capsule Rickard Kennerly C, PA-C   dextromethorphan-guaiFENesin (MUCINEX DM) 30-600 MG 12hr tablet Take 1 tablet by mouth 2 (two) times daily. 20 tablet Zakee Deerman C, PA-C   Cetirizine HCl 10 MG CAPS Take 1 capsule (10 mg total) by mouth daily for 10 days. 10 capsule Monea Pesantez, Nora C, PA-C      PDMP not reviewed this encounter.   Lew Dawes, [place] 08/23/20 [phone]

## 2020-08-23 NOTE — ED Triage Notes (Signed)
Pt presents with cough, congestion, and sore throat that started last night.

## 2020-08-23 NOTE — Discharge Instructions (Signed)
Strep test negative, COVID test pending, monitor MyChart for results Rest and fluids Tylenol and ibuprofen as needed for any headache, body aches, sore throat, fever Use albuterol inhaler or nebulizers as needed for shortness of breath chest tightness, wheezing Begin daily cetirizine to help with postnasal drainage/throat irritation Mucinex DM twice daily for congestion/cough Tessalon every 8 hours for cough Honey and hot tea Salt water gargles Follow-up if not improving or worsening

## 2020-08-24 ENCOUNTER — Telehealth: Payer: Self-pay | Admitting: Emergency Medicine

## 2020-08-24 LAB — NOVEL CORONAVIRUS, NAA: SARS-CoV-2, NAA: DETECTED — AB

## 2020-08-24 LAB — SARS-COV-2, NAA 2 DAY TAT

## 2020-08-24 MED ORDER — GUAIFENESIN-CODEINE 100-10 MG/5ML PO SOLN: 5.0000 mL | Freq: Three times a day (TID) | ORAL | 0 refills | Status: DC | PRN

## 2020-08-24 NOTE — Telephone Encounter (Signed)
Discussed positive covid, sending robitussin with codiene

## 2020-08-26 ENCOUNTER — Other Ambulatory Visit: Payer: Self-pay

## 2020-08-26 ENCOUNTER — Ambulatory Visit
Admission: EM | Admit: 2020-08-26 | Discharge: 2020-08-26 | Disposition: A | Discharge status: Home / Self Care | Attending: Emergency Medicine | Admitting: Emergency Medicine

## 2020-08-26 DIAGNOSIS — J209 Acute bronchitis, unspecified: Secondary | ICD-10-CM

## 2020-08-26 DIAGNOSIS — U071 COVID-19: Secondary | ICD-10-CM

## 2020-08-26 LAB — CULTURE, GROUP A STREP (THRC)

## 2020-08-26 MED ORDER — PREDNISONE 50 MG PO TABS: 50.0000 mg | ORAL_TABLET | Freq: Every day | ORAL | 0 refills | Status: AC

## 2020-08-26 NOTE — Discharge Instructions (Addendum)
Continue to rest and fluids Continue nebulizers and albuterol inhaler every 4-6 hours Prednisone daily x5 days-take with food and earlier in the day if possible Please follow-up if not improving or worsening

## 2020-08-26 NOTE — ED Triage Notes (Addendum)
Patient presents to Urgent Care with complaints of SOB with activity and talking that has worsened since Tuesday. She reports she has also lost her taste/smell. Treating symptoms with both inhalers.   Denies fever.

## 2020-08-26 NOTE — ED Provider Notes (Signed)
[facility]    CSN: [id number] Arrival date & time: 08/26/20  4098      History   Chief Complaint Chief Complaint  Patient presents with   Shortness of Breath    COVID positive    HPI Charlene Griffin is a 30 y.o. female presenting today for follow-up of COVID-19.  Patient tested positive for COVID on Tuesday, she was given albuterol inhaler and duo nebs for at home, presenting today as she has had persistent/worsening shortness of breath.  Is prone to developing bronchitis.  Reports a wheezing sensation in chest that clears with cough.  Denies fevers.  HPI  Past Medical History:  Diagnosis Date   ADHD    Allergy    Alopecia    Anxiety    Bronchitis    Celiac disease    Complication of anesthesia    intubation bronchitis   GERD (gastroesophageal reflux disease)    rare   Hemorrhoids    Immune deficiency disorder (HCC)    Insomnia    Major depression    Migraine with aura    Panic attack    with surgery   PCOS (polycystic ovarian syndrome)    PONV (postoperative nausea and vomiting)    Tinnitus    Umbilical hernia     Patient Active Problem List   Diagnosis Date Noted   Incisional hernia 09/02/2019   Migraine with aura    Non-seasonal allergic rhinitis 04/04/2018   Tinnitus of both ears 04/04/2018   Alopecia areata 02/08/2017   Enlarged thyroid gland 02/08/2017   Anxiety 12/11/2015   Attention deficit hyperactivity disorder (ADHD) 12/11/2015    Past Surgical History:  Procedure Laterality Date   HERNIA REPAIR     x 2   INCISIONAL HERNIA REPAIR N/A 09/02/2019   Procedure: LAPAROSCOPIC INCISIONAL HERNIA REPAIR WITH MESH;  Surgeon: Kinsinger, De Blanch, MD;  Location: [facility];  Service: General;  Laterality: N/A;   LAPAROSCOPY N/A 12/29/2018   Procedure: LAPAROSCOPY DIAGNOSTIC;  Surgeon: Romualdo Bolk, MD;  Location: [facility];  Service: Gynecology;  Laterality: N/A;  1 hour surgery time/ follow Dr Rica Records case   LEFT  OOPHORECTOMY     OVARIAN CYST REMOVAL Left 12/29/2018   Procedure: LAPARASCOPIC OOPHARECTOMY;  Surgeon: Romualdo Bolk, MD;  Location: [facility];  Service: Gynecology;  Laterality: Left;   WISDOM TOOTH EXTRACTION      OB History     Gravida  0   Para  0   Term  0   Preterm  0   AB  0   Living  0      SAB  0   IAB  0   Ectopic  0   Multiple  0   Live Births  0            Home Medications    Prior to Admission medications   Medication Sig Start Date End Date Taking? Authorizing Provider  predniSONE (DELTASONE) 50 MG tablet Take 1 tablet (50 mg total) by mouth daily with breakfast for 5 days. 08/26/20 08/31/20 Yes Andry Bogden C, PA-C  albuterol (VENTOLIN HFA) 108 (90 Base) MCG/ACT inhaler Inhale 1-2 puffs into the lungs every 6 (six) hours as needed for wheezing or shortness of breath. 08/23/20   Jamariya Davidoff C, PA-C  benzonatate (TESSALON) 200 MG capsule Take 1 capsule (200 mg total) by mouth 3 (three) times daily as needed for up to 7 days for cough. 08/23/20 08/30/20  Natan Hartog C, PA-C  Cetirizine HCl 10 MG CAPS Take 1 capsule (10 mg total) by mouth daily for 10 days. 08/23/20 09/02/20  Zitlali Primm C, PA-C  clobetasol ointment (TEMOVATE) 0.05 % Apply 1 application topically 2 (two) times daily. 08/17/20   Lillien Petronio C, PA-C  clotrimazole-betamethasone (LOTRISONE) cream Apply to affected area 2 times daily 08/10/20   Theola Cuellar, [employer]Hallie C, PA-C  dextromethorphan-guaiFENesin (MUCINEX DM) 30-600 MG 12hr tablet Take 1 tablet by mouth 2 (two) times daily. 08/23/20   Teresia Myint C, PA-C  estradiol (ESTRACE) 0.1 MG/GM vaginal cream Place vaginally. 03/28/20   [provider]  guaiFENesin-codeine 100-10 MG/5ML syrup Take 5-10 mLs by mouth 3 (three) times daily as needed for cough. 08/24/20   Priyana Mccarey C, PA-C  ipratropium-albuterol (DUONEB) 0.5-2.5 (3) MG/3ML SOLN Take 3 mLs by nebulization every 6 (six) hours as needed.  08/23/20   Rechy Bost C, PA-C  meloxicam (MOBIC) 15 MG tablet Take 15 mg by mouth daily. 08/12/20   [provider]  meloxicam (MOBIC) 7.5 MG tablet Take 7.5 mg by mouth daily.    [provider]  montelukast (SINGULAIR) 10 MG tablet Take 1 tablet (10 mg total) by mouth at bedtime. 04/08/20   Kozlow, Alvira PhilipsEric J, MD  norethindrone (MICRONOR) 0.35 MG tablet Take 0.35 mg by mouth daily.    [provider]  Olopatadine HCl 0.2 % SOLN Can use one drop in each eye once daily if needed. 04/08/20   Kozlow, Alvira PhilipsEric J, MD  omeprazole (PRILOSEC) 40 MG capsule Take one capsule by mouth once daily. 04/08/20   Kozlow, Alvira PhilipsEric J, MD  Rimegepant Sulfate (NURTEC) 75 MG TBDP Take 75 mg by mouth daily as needed (migraines).     [provider]  scopolamine (TRANSDERM-SCOP) 1 MG/3DAYS Place 1 patch (1.5 mg total) onto the skin every 3 (three) days. 08/17/20   Anwar Sakata, Junius CreamerHallie C, PA-C    Family History Family History  Problem Relation Age of Onset   Colon cancer Maternal Grandfather        passed away [id number]   Healthy Mother    Healthy Father    Asthma Sister    Factor V Leiden deficiency Sister    Ovarian cancer Paternal Grandmother     Social History Social History   Tobacco Use   Smoking status: Some Days    Types: E-cigarettes   Smokeless tobacco: Never  Vaping Use   Vaping Use: Some days   Substances: Nicotine, Flavoring  Substance Use Topics   Alcohol use: Yes    Comment: socially   Drug use: Never     Allergies   Botox [botulinum toxin type a] and Gluten meal   Review of Systems Review of Systems  Constitutional:  Positive for fatigue. Negative for activity change, appetite change, chills and fever.  HENT:  Positive for congestion and rhinorrhea. Negative for ear pain, sinus pressure, sore throat and trouble swallowing.   Eyes:  Negative for discharge and redness.  Respiratory:  Positive for cough, shortness of breath and wheezing. Negative for chest tightness.    Cardiovascular:  Negative for chest pain.  Gastrointestinal:  Negative for abdominal pain, diarrhea, nausea and vomiting.  Musculoskeletal:  Negative for myalgias.  Skin:  Negative for rash.  Neurological:  Negative for dizziness, light-headedness and headaches.    Physical Exam Triage Vital Signs ED Triage Vitals  Enc Vitals Group     BP 08/26/20 0938 118/81     Pulse Rate 08/26/20 0938 76  Resp 08/26/20 0938 16     Temp 08/26/20 0938 (!) 96.8 F (36 C)     Temp Source 08/26/20 0938 Temporal     SpO2 08/26/20 0938 98 %     Weight --      Height --      Head Circumference --      Peak Flow --      Pain Score 08/26/20 0937 0     Pain Loc --      Pain Edu? --      Excl. in GC? --    No data found.  Updated Vital Signs BP 118/81 (BP Location: Left Arm)   Pulse 76   Temp (!) 96.8 F (36 C) (Temporal)   Resp 16   SpO2 98%   Visual Acuity Right Eye Distance:   Left Eye Distance:   Bilateral Distance:    Right Eye Near:   Left Eye Near:    Bilateral Near:     Physical Exam Vitals and nursing note reviewed.  Constitutional:      Appearance: She is well-developed.     Comments: No acute distress  HENT:     Head: Normocephalic and atraumatic.     Ears:     Comments: Bilateral ears without tenderness to palpation of external auricle, tragus and mastoid, EAC's without erythema or swelling, TM's with good bony landmarks and cone of light. Non erythematous.      Nose: Nose normal.     Mouth/Throat:     Comments: Oral mucosa pink and moist, no tonsillar enlargement or exudate. Posterior pharynx patent and nonerythematous, no uvula deviation or swelling. Normal phonation.  Eyes:     Conjunctiva/sclera: Conjunctivae normal.  Cardiovascular:     Rate and Rhythm: Normal rate and regular rhythm.  Pulmonary:     Effort: Pulmonary effort is normal. No respiratory distress.     Comments: Rhonchi and coarseness noted throughout left lung fields, less prominent on  right Abdominal:     General: There is no distension.  Musculoskeletal:        General: Normal range of motion.     Cervical back: Neck supple.  Skin:    General: Skin is warm and dry.  Neurological:     Mental Status: She is alert and oriented to person, place, and time.     UC Treatments / Results  Labs (all labs ordered are listed, but only abnormal results are displayed) Labs Reviewed - No data to display  EKG   Radiology No results found.  Procedures Procedures (including critical care time)  Medications Ordered in UC Medications - No data to display  Initial Impression / Assessment and Plan / UC Course  I have reviewed the triage vital signs and the nursing notes.  Pertinent labs & imaging results that were available during my care of the patient were reviewed by me and considered in my medical decision making (see chart for details).    COVID-19/bronchitis-continue albuterol inhaler/nebulizers, adding in prednisone course x5 days, continue symptomatic and supportive care previously discussed for cough and congestion.  Discussed strict return precautions. Patient verbalized understanding and is agreeable with plan.  Final Clinical Impressions(s) / UC Diagnoses   Final diagnoses:  COVID-19  Acute bronchitis, unspecified organism     Discharge Instructions      Continue to rest and fluids Continue nebulizers and albuterol inhaler every 4-6 hours Prednisone daily x5 days-take with food and earlier in the day if possible Please follow-up if  not improving or worsening   ED Prescriptions     Medication Sig Dispense Auth. Provider   predniSONE (DELTASONE) 50 MG tablet Take 1 tablet (50 mg total) by mouth daily with breakfast for 5 days. 5 tablet Alanzo Lamb, [place] C, PA-C      PDMP not reviewed this encounter.   Lew Dawes, PA-C 08/26/20 1007

## 2020-09-15 ENCOUNTER — Other Ambulatory Visit: Payer: Self-pay

## 2020-09-15 ENCOUNTER — Ambulatory Visit
Admission: EM | Admit: 2020-09-15 | Discharge: 2020-09-15 | Disposition: A | Discharge status: Home / Self Care | Attending: Emergency Medicine | Admitting: Emergency Medicine

## 2020-09-15 DIAGNOSIS — N760 Acute vaginitis: Secondary | ICD-10-CM

## 2020-09-15 DIAGNOSIS — B9689 Other specified bacterial agents as the cause of diseases classified elsewhere: Secondary | ICD-10-CM | POA: Diagnosis not present

## 2020-09-15 DIAGNOSIS — R1032 Left lower quadrant pain: Secondary | ICD-10-CM

## 2020-09-15 DIAGNOSIS — N898 Other specified noninflammatory disorders of vagina: Secondary | ICD-10-CM

## 2020-09-15 LAB — POCT URINALYSIS DIP (MANUAL ENTRY)
Bilirubin, UA: NEGATIVE
Blood, UA: NEGATIVE
Glucose, UA: NEGATIVE mg/dL
Ketones, POC UA: NEGATIVE mg/dL
Leukocytes, UA: NEGATIVE
Nitrite, UA: NEGATIVE
Protein Ur, POC: NEGATIVE mg/dL
Spec Grav, UA: >=1.03 — AB (ref 1.010–1.025)
Urobilinogen, UA: 0.2 E.U./dL
pH, UA: 6 (ref 5.0–8.0)

## 2020-09-15 MED ORDER — CLINDAMYCIN PHOSPHATE 2 % VA CREA: 1.0000 | TOPICAL_CREAM | Freq: Every day | VAGINAL | Status: DC

## 2020-09-15 MED ORDER — CLINDAMYCIN PHOSPHATE 2 % VA CREA: 1.0000 | TOPICAL_CREAM | Freq: Every day | VAGINAL | 0 refills | Status: DC

## 2020-09-15 MED ORDER — FLUCONAZOLE 200 MG PO TABS: 200.0000 mg | ORAL_TABLET | Freq: Every day | ORAL | 0 refills | Status: AC

## 2020-09-15 MED ORDER — METRONIDAZOLE 500 MG PO TABS: 500.0000 mg | ORAL_TABLET | Freq: Two times a day (BID) | ORAL | 0 refills | Status: DC

## 2020-09-15 NOTE — ED Provider Notes (Signed)
[facility]    CSN: [id number] Arrival date & time: 09/15/20  0803      History   Chief Complaint Chief Complaint  Patient presents with   Abdominal Pain   Vaginitis    HPI GLENOLA WHEAT is a 30 y.o. female.   Pt has been seen several times for bacteria vaginosis . Was seen by her obgyn last week tested positive for yeast and BV. Placed on flagyl gel and diflucan. Completed dose. Went on a cruise and thinks it never wen away. Same symptoms and lt lower abd pain. Has not taken anything pta.    Past Medical History:  Diagnosis Date   ADHD    Allergy    Alopecia    Anxiety    Bronchitis    Celiac disease    Complication of anesthesia    intubation bronchitis   GERD (gastroesophageal reflux disease)    rare   Hemorrhoids    Immune deficiency disorder (HCC)    Insomnia    Major depression    Migraine with aura    Panic attack    with surgery   PCOS (polycystic ovarian syndrome)    PONV (postoperative nausea and vomiting)    Tinnitus    Umbilical hernia     Patient Active Problem List   Diagnosis Date Noted   Incisional hernia 09/02/2019   Migraine with aura    Non-seasonal allergic rhinitis 04/04/2018   Tinnitus of both ears 04/04/2018   Alopecia areata 02/08/2017   Enlarged thyroid gland 02/08/2017   Anxiety 12/11/2015   Attention deficit hyperactivity disorder (ADHD) 12/11/2015    Past Surgical History:  Procedure Laterality Date   HERNIA REPAIR     x 2   INCISIONAL HERNIA REPAIR N/A 09/02/2019   Procedure: LAPAROSCOPIC INCISIONAL HERNIA REPAIR WITH MESH;  Surgeon: Kinsinger, De Blanch, MD;  Location: [facility];  Service: General;  Laterality: N/A;   LAPAROSCOPY N/A 12/29/2018   Procedure: LAPAROSCOPY DIAGNOSTIC;  Surgeon: Romualdo Bolk, MD;  Location: [facility];  Service: Gynecology;  Laterality: N/A;  1 hour surgery time/ follow Dr Rica Records case   LEFT OOPHORECTOMY     OVARIAN CYST REMOVAL Left 12/29/2018    Procedure: LAPARASCOPIC OOPHARECTOMY;  Surgeon: Romualdo Bolk, MD;  Location: [facility];  Service: Gynecology;  Laterality: Left;   WISDOM TOOTH EXTRACTION      OB History     Gravida  0   Para  0   Term  0   Preterm  0   AB  0   Living  0      SAB  0   IAB  0   Ectopic  0   Multiple  0   Live Births  0            Home Medications    Prior to Admission medications   Medication Sig Start Date End Date Taking? Authorizing Provider  fluconazole (DIFLUCAN) 200 MG tablet Take 1 tablet (200 mg total) by mouth daily for 7 days. 09/15/20 09/22/20 Yes Coralyn Mark, NP  metroNIDAZOLE (FLAGYL) 500 MG tablet Take 1 tablet (500 mg total) by mouth 2 (two) times daily. 09/15/20  Yes Coralyn Mark, NP  albuterol (VENTOLIN HFA) 108 (90 Base) MCG/ACT inhaler Inhale 1-2 puffs into the lungs every 6 (six) hours as needed for wheezing or shortness of breath. 08/23/20   Wieters, Hallie C, PA-C  Cetirizine HCl 10 MG CAPS Take 1 capsule (  10 mg total) by mouth daily for 10 days. 08/23/20 09/02/20  Wieters, Hallie C, PA-C  clobetasol ointment (TEMOVATE) 0.05 % Apply 1 application topically 2 (two) times daily. 08/17/20   Wieters, Hallie C, PA-C  clotrimazole-betamethasone (LOTRISONE) cream Apply to affected area 2 times daily 08/10/20   Wieters, [employer] C, PA-C  dextromethorphan-guaiFENesin (MUCINEX DM) 30-600 MG 12hr tablet Take 1 tablet by mouth 2 (two) times daily. 08/23/20   Wieters, Hallie C, PA-C  estradiol (ESTRACE) 0.1 MG/GM vaginal cream Place vaginally. 03/28/20   [provider]  guaiFENesin-codeine 100-10 MG/5ML syrup Take 5-10 mLs by mouth 3 (three) times daily as needed for cough. 08/24/20   Wieters, Hallie C, PA-C  ipratropium-albuterol (DUONEB) 0.5-2.5 (3) MG/3ML SOLN Take 3 mLs by nebulization every 6 (six) hours as needed. 08/23/20   Wieters, Hallie C, PA-C  meloxicam (MOBIC) 15 MG tablet Take 15 mg by mouth daily. 08/12/20   [provider]  meloxicam (MOBIC) 7.5 MG tablet Take 7.5 mg by mouth daily.    [provider]  montelukast (SINGULAIR) 10 MG tablet Take 1 tablet (10 mg total) by mouth at bedtime. 04/08/20   Kozlow, Alvira Philips, MD  norethindrone (MICRONOR) 0.35 MG tablet Take 0.35 mg by mouth daily.    [provider]  Olopatadine HCl 0.2 % SOLN Can use one drop in each eye once daily if needed. 04/08/20   Kozlow, Alvira Philips, MD  omeprazole (PRILOSEC) 40 MG capsule Take one capsule by mouth once daily. 04/08/20   Kozlow, Alvira Philips, MD  Rimegepant Sulfate (NURTEC) 75 MG TBDP Take 75 mg by mouth daily as needed (migraines).     [provider]  scopolamine (TRANSDERM-SCOP) 1 MG/3DAYS Place 1 patch (1.5 mg total) onto the skin every 3 (three) days. 08/17/20   Wieters, Junius Creamer, PA-C    Family History Family History  Problem Relation Age of Onset   Colon cancer Maternal Grandfather        passed away 68   Healthy Mother    Healthy Father    Asthma Sister    Factor V Leiden deficiency Sister    Ovarian cancer Paternal Grandmother     Social History Social History   Tobacco Use   Smoking status: Some Days    Types: E-cigarettes   Smokeless tobacco: Never  Vaping Use   Vaping Use: Some days   Substances: Nicotine, Flavoring  Substance Use Topics   Alcohol use: Yes    Comment: socially   Drug use: Never     Allergies   Botox [botulinum toxin type a] and Gluten meal   Review of Systems Review of Systems  Constitutional:  Negative for chills and fever.  Respiratory: Negative.    Cardiovascular: Negative.   Gastrointestinal:  Positive for abdominal pain and nausea.  Genitourinary:  Positive for frequency and pelvic pain.       Foul odor and white vaginal discharge with some itching.   Neurological: Negative.     Physical Exam Triage Vital Signs ED Triage Vitals  Enc Vitals Group     BP 09/15/20 0818 119/76     Pulse Rate 09/15/20 0818 62     Resp 09/15/20 0818 16     Temp 09/15/20  0818 98 F (36.7 C)     Temp Source 09/15/20 0818 Oral     SpO2 09/15/20 0818 98 %     Weight --      Height --      Head  Circumference --      Peak Flow --      Pain Score 09/15/20 0813 2     Pain Loc --      Pain Edu? --      Excl. in [place]? --    No data found.  Updated Vital Signs BP 119/76 (BP Location: Right Arm)   Pulse 62   Temp 98 F (36.7 C) (Oral)   Resp 16   LMP 06/17/2020   SpO2 98%   Visual Acuity Right Eye Distance:   Left Eye Distance:   Bilateral Distance:    Right Eye Near:   Left Eye Near:    Bilateral Near:     Physical Exam Constitutional:      Appearance: She is well-developed.  Cardiovascular:     Rate and Rhythm: Normal rate.  Pulmonary:     Effort: Pulmonary effort is normal.  Abdominal:     General: Abdomen is flat. Bowel sounds are normal.     Palpations: Abdomen is soft.     Tenderness: There is abdominal tenderness in the left lower quadrant. There is no right CVA tenderness, left CVA tenderness or rebound.  Neurological:     Mental Status: She is alert.     UC Treatments / Results  Labs (all labs ordered are listed, but only abnormal results are displayed) Labs Reviewed  POCT URINALYSIS DIP (MANUAL ENTRY) - Abnormal; Notable for the following components:      Result Value   Spec Grav, UA >=1.030 (*)    All other components within normal limits    EKG   Radiology No results found.  Procedures Procedures (including critical care time)  Medications Ordered in UC Medications  clindamycin (CLEOCIN) 2 % vaginal cream 1 Applicatorful (has no administration in time range)    Initial Impression / Assessment and Plan / UC Course  I have reviewed the triage vital signs and the nursing notes.  Pertinent labs & imaging results that were available during my care of the patient were reviewed by me and considered in my medical decision making (see chart for details).     We will treat for longer course of treatment  You will  need to follow up with your obgyn  Stay hydrated  Do not drink alcohol while taking medications and take with food   Final Clinical Impressions(s) / UC Diagnoses   Final diagnoses:  Left lower quadrant abdominal pain  Vaginal discharge  Bacterial vaginosis     Discharge Instructions      We will treat for longer course of treatment  You will need to follow up with your obgyn  Stay hydrated  Do not drink alcohol while taking medications and take with food      ED Prescriptions     Medication Sig Dispense Auth. Provider   metroNIDAZOLE (FLAGYL) 500 MG tablet Take 1 tablet (500 mg total) by mouth 2 (two) times daily. 14 tablet Maple Mirza L, NP   fluconazole (DIFLUCAN) 200 MG tablet Take 1 tablet (200 mg total) by mouth daily for 7 days. 7 tablet Coralyn Mark, NP      PDMP not reviewed this encounter.   Coralyn Mark, NP 09/15/20 [phone]

## 2020-09-15 NOTE — Discharge Instructions (Addendum)
We will treat for longer course of treatment  You will need to follow up with your obgyn  Stay hydrated  Do not drink alcohol while taking medications and take with food  Your urine was negative for infection

## 2020-09-15 NOTE — ED Triage Notes (Signed)
Patient presents to Urgent Care with complaints of possible yeast infection. She states she was seen by ob-gyn last Tuesday and treated for BV. She states she believes she now has developed a yeast infection. C/o of abdominal pain and vaginal discharge. Pt has a hx of yeast and BV infections.    Denies fever, hematuria, or any urinary symptoms.

## 2020-11-08 ENCOUNTER — Ambulatory Visit
Admission: EM | Admit: 2020-11-08 | Discharge: 2020-11-08 | Disposition: A | Discharge status: Home / Self Care | Attending: Emergency Medicine | Admitting: Emergency Medicine

## 2020-11-08 ENCOUNTER — Other Ambulatory Visit: Payer: Self-pay

## 2020-11-08 DIAGNOSIS — J019 Acute sinusitis, unspecified: Secondary | ICD-10-CM

## 2020-11-08 MED ORDER — DOXYCYCLINE HYCLATE 100 MG PO CAPS: 100.0000 mg | ORAL_CAPSULE | Freq: Two times a day (BID) | ORAL | 0 refills | Status: AC

## 2020-11-08 MED ORDER — FLUCONAZOLE 150 MG PO TABS: 150.0000 mg | ORAL_TABLET | Freq: Every day | ORAL | 0 refills | Status: AC

## 2020-11-08 MED ORDER — PSEUDOEPHEDRINE HCL 30 MG PO TABS: 60.0000 mg | ORAL_TABLET | Freq: Three times a day (TID) | ORAL | 0 refills | Status: AC | PRN

## 2020-11-08 MED ORDER — MONTELUKAST SODIUM 10 MG PO TABS: 10.0000 mg | ORAL_TABLET | Freq: Every day | ORAL | 0 refills | Status: AC

## 2020-11-08 MED ORDER — IPRATROPIUM BROMIDE 0.03 % NA SOLN: 2.0000 | Freq: Two times a day (BID) | NASAL | 12 refills | Status: DC

## 2020-11-08 MED ORDER — NAPROXEN 500 MG PO TABS: 500.0000 mg | ORAL_TABLET | Freq: Two times a day (BID) | ORAL | 0 refills | Status: DC

## 2020-11-08 NOTE — Discharge Instructions (Addendum)
For bacterial sinus infection, please begin doxycycline, 1 tablet twice daily for the next 30 days.  For symptomatic treatment of sinusitis, please begin naproxen 500 mg twice daily for the next week, please do not take meloxicam while you are taking the naproxen.  This high dose of naproxen should adequately replace your lower dose meloxicam and when your sinusitis is improved you can go back to taking her meloxicam daily.  Atrovent nasal spray is recommended to significantly shrink the swelling in your nasal passages, improve your work of breathing and enhance drainage of your sinuses.  Sudafed has been prescribed to dry up your mucous membranes in your nasal passages as well as your sinuses, this makes your upper respiratory tract and hospital for bacterial residency.  I also recommend that you resume montelukast if you have tolerated this medication in the past, it also reduces inflammation in the respiratory tract.  Please follow-up with your primary care provider in the next 2 weeks to discuss your progress and make adjustments to your treatment regimen if needed.  The results of your COVID, flu and RSV testing will be made available to you once they are complete, typically this is 24 to 48 hours, the result will also be posted to your MyChart.

## 2020-11-08 NOTE — ED Provider Notes (Signed)
[facility]    CSN: [id number] Arrival date & time: 11/08/20  [id number]      History   Chief Complaint Chief Complaint  Patient presents with   Nasal Congestion    HPI Charlene Griffin is a 30 y.o. female.   Patient complains of facial pressure and sinus drainage for the past 2 days, patient reports a history of frequent bacterial sinus infections.  Patient states she has been taking some over-the-counter sinus medication with no relief.  Patient also reports that she is immune compromised, has a history of celiac disease.  Patient denies fever, aches, chills, nausea, vomiting, diarrhea, dizziness, vision changes, epistaxis, shortness of breath, cough.  The history is provided by the patient.   Past Medical History:  Diagnosis Date   ADHD    Allergy    Alopecia    Anxiety    Bronchitis    Celiac disease    Complication of anesthesia    intubation bronchitis   GERD (gastroesophageal reflux disease)    rare   Hemorrhoids    Immune deficiency disorder ([place])    Insomnia    Major depression    Migraine with aura    Panic attack    with surgery   PCOS (polycystic ovarian syndrome)    PONV (postoperative nausea and vomiting)    Tinnitus    Umbilical hernia     Patient Active Problem List   Diagnosis Date Noted   Incisional hernia 09/02/2019   Migraine with aura    Non-seasonal allergic rhinitis 04/04/2018   Tinnitus of both ears 04/04/2018   Alopecia areata 02/08/2017   Enlarged thyroid gland 02/08/2017   Anxiety 12/11/2015   Attention deficit hyperactivity disorder (ADHD) 12/11/2015    Past Surgical History:  Procedure Laterality Date   HERNIA REPAIR     x 2   INCISIONAL HERNIA REPAIR N/A 09/02/2019   Procedure: LAPAROSCOPIC INCISIONAL HERNIA REPAIR WITH MESH;  Surgeon: Kinsinger, Arta Bruce, MD;  Location: [facility];  Service: General;  Laterality: N/A;   LAPAROSCOPY N/A 12/29/2018   Procedure: LAPAROSCOPY DIAGNOSTIC;  Surgeon: Salvadore Dom, MD;  Location: [place];  Service: Gynecology;  Laterality: N/A;  1 hour surgery time/ follow Dr Elza Rafter case   LEFT OOPHORECTOMY     OVARIAN CYST REMOVAL Left 12/29/2018   Procedure: LAPARASCOPIC OOPHARECTOMY;  Surgeon: Salvadore Dom, MD;  Location: [facility];  Service: Gynecology;  Laterality: Left;   WISDOM TOOTH EXTRACTION      OB History     Gravida  0   Para  0   Term  0   Preterm  0   AB  0   Living  0      SAB  0   IAB  0   Ectopic  0   Multiple  0   Live Births  0            Home Medications    Prior to Admission medications   Medication Sig Start Date End Date Taking? Authorizing Provider  doxycycline (VIBRAMYCIN) 100 MG capsule Take 1 capsule (100 mg total) by mouth 2 (two) times daily. 11/08/20 12/08/20 Yes Lynden Oxford Scales, PA-C  fluconazole (DIFLUCAN) 150 MG tablet Take 1 tablet (150 mg total) by mouth daily for 7 days. 11/08/20 11/15/20 Yes Lynden Oxford Scales, PA-C  ipratropium (ATROVENT) 0.03 % nasal spray Place 2 sprays into both nostrils every 12 (twelve) hours. 11/08/20  Yes Lynden Oxford Scales,  PA-C  naproxen (NAPROSYN) 500 MG tablet Take 1 tablet (500 mg total) by mouth 2 (two) times daily. 11/08/20  Yes Lynden Oxford Scales, PA-C  pseudoephedrine (SUDAFED) 30 MG tablet Take 2 tablets (60 mg total) by mouth every 8 (eight) hours as needed for up to 4 days for congestion. 11/08/20 11/12/20 Yes Lynden Oxford Scales, PA-C  Cetirizine HCl 10 MG CAPS Take 1 capsule (10 mg total) by mouth daily for 10 days. 08/23/20 09/02/20  Wieters, Hallie C, PA-C  montelukast (SINGULAIR) 10 MG tablet Take 1 tablet (10 mg total) by mouth at bedtime. 11/08/20   Lynden Oxford Scales, PA-C    Family History Family History  Problem Relation Age of Onset   Colon cancer Maternal Grandfather        passed away 82   Healthy Mother    Healthy Father    Asthma Sister    Factor V Leiden deficiency Sister     Ovarian cancer Paternal Grandmother     Social History Social History   Tobacco Use   Smoking status: Some Days    Types: E-cigarettes   Smokeless tobacco: Never  Vaping Use   Vaping Use: Some days   Substances: Nicotine, Flavoring  Substance Use Topics   Alcohol use: Yes    Comment: socially   Drug use: Never     Allergies   Botox [botulinum toxin type a] and Gluten meal   Review of Systems Review of Systems Pertinent findings noted in history of present illness.    Physical Exam Triage Vital Signs ED Triage Vitals  Enc Vitals Group     BP 11/04/20 0827 (!) 147/82     Pulse Rate 11/04/20 0827 72     Resp 11/04/20 0827 18     Temp 11/04/20 0827 98.3 F (36.8 C)     Temp Source 11/04/20 0827 Oral     SpO2 11/04/20 0827 98 %     Weight --      Height --      Head Circumference --      Peak Flow --      Pain Score 11/04/20 0826 5     Pain Loc --      Pain Edu? --      Excl. in [place]? --    No data found.  Updated Vital Signs BP 100/68   Pulse 64   Temp (!) 97.3 F (36.3 C)   Resp 19   SpO2 98%   Visual Acuity Right Eye Distance:   Left Eye Distance:   Bilateral Distance:    Right Eye Near:   Left Eye Near:    Bilateral Near:     Physical Exam Vitals and nursing note reviewed.  Constitutional:      General: She is not in acute distress.    Appearance: Normal appearance. She is not ill-appearing.  HENT:     Head: Normocephalic and atraumatic.     Salivary Glands: Right salivary gland is not diffusely enlarged or tender. Left salivary gland is not diffusely enlarged or tender.     Right Ear: Tympanic membrane, ear canal and external ear normal. No drainage. No middle ear effusion. There is no impacted cerumen. Tympanic membrane is not erythematous or bulging.     Left Ear: Tympanic membrane, ear canal and external ear normal. No drainage.  No middle ear effusion. There is no impacted cerumen. Tympanic membrane is not erythematous or bulging.      Nose: Nose normal. No nasal  deformity, septal deviation, mucosal edema, congestion or rhinorrhea.     Right Turbinates: Not enlarged, swollen or pale.     Left Turbinates: Not enlarged, swollen or pale.     Right Sinus: No maxillary sinus tenderness or frontal sinus tenderness.     Left Sinus: No maxillary sinus tenderness or frontal sinus tenderness.     Mouth/Throat:     Lips: Pink. No lesions.     Mouth: Mucous membranes are moist. No oral lesions.     Pharynx: Oropharynx is clear. Uvula midline. No posterior oropharyngeal erythema or uvula swelling.     Tonsils: No tonsillar exudate. 0 on the right. 0 on the left.  Eyes:     General: Lids are normal.        Right eye: No discharge.        Left eye: No discharge.     Extraocular Movements: Extraocular movements intact.     Conjunctiva/sclera: Conjunctivae normal.     Right eye: Right conjunctiva is not injected.     Left eye: Left conjunctiva is not injected.  Neck:     Trachea: Trachea and phonation normal.  Cardiovascular:     Rate and Rhythm: Normal rate and regular rhythm.     Pulses: Normal pulses.     Heart sounds: Normal heart sounds. No murmur heard.   No friction rub. No gallop.  Pulmonary:     Effort: Pulmonary effort is normal. No accessory muscle usage, prolonged expiration or respiratory distress.     Breath sounds: Normal breath sounds. No stridor, decreased air movement or transmitted upper airway sounds. No decreased breath sounds, wheezing, rhonchi or rales.  Chest:     Chest wall: No tenderness.  Musculoskeletal:        General: Normal range of motion.     Cervical back: Normal range of motion and neck supple. Normal range of motion.  Lymphadenopathy:     Cervical: No cervical adenopathy.  Skin:    General: Skin is warm and dry.     Findings: No erythema or rash.  Neurological:     General: No focal deficit present.     Mental Status: She is alert and oriented to person, place, and time.  Psychiatric:         Mood and Affect: Mood normal.        Behavior: Behavior normal.     UC Treatments / Results  Labs (all labs ordered are listed, but only abnormal results are displayed) Labs Reviewed  COVID-19, FLU A+B AND RSV    EKG   Radiology No results found.  Procedures Procedures (including critical care time)  Medications Ordered in UC Medications - No data to display  Initial Impression / Assessment and Plan / UC Course  I have reviewed the triage vital signs and the nursing notes.  Pertinent labs & imaging results that were available during my care of the patient were reviewed by me and considered in my medical decision making (see chart for details).     Physical exam today is unremarkable however given patient's history of recurrent sinusitis as well as her reported incompetent state, I agree that rather than allowing her to wait for a bacterial sinus infection may be more beneficial to treat her prophylactically.  Patient verbalized understanding and agreement of plan as discussed.  All questions were addressed during visit.  Please see discharge instructions below for further details of plan.  Final Clinical Impressions(s) / UC Diagnoses   Final diagnoses:  Acute sinusitis, recurrence not specified, unspecified location     Discharge Instructions      For bacterial sinus infection, please begin doxycycline, 1 tablet twice daily for the next 30 days.  For symptomatic treatment of sinusitis, please begin naproxen 500 mg twice daily for the next week, please do not take meloxicam while you are taking the naproxen.  This high dose of naproxen should adequately replace your lower dose meloxicam and when your sinusitis is improved you can go back to taking her meloxicam daily.  Atrovent nasal spray is recommended to significantly shrink the swelling in your nasal passages, improve your work of breathing and enhance drainage of your sinuses.  Sudafed has been prescribed  to dry up your mucous membranes in your nasal passages as well as your sinuses, this makes your upper respiratory tract and hospital for bacterial residency.  I also recommend that you resume montelukast if you have tolerated this medication in the past, it also reduces inflammation in the respiratory tract.  Please follow-up with your primary care provider in the next 2 weeks to discuss your progress and make adjustments to your treatment regimen if needed.  The results of your COVID, flu and RSV testing will be made available to you once they are complete, typically this is 24 to 48 hours, the result will also be posted to your MyChart.     ED Prescriptions     Medication Sig Dispense Auth. Provider   pseudoephedrine (SUDAFED) 30 MG tablet Take 2 tablets (60 mg total) by mouth every 8 (eight) hours as needed for up to 4 days for congestion. 24 tablet Lynden Oxford Scales, PA-C   naproxen (NAPROSYN) 500 MG tablet Take 1 tablet (500 mg total) by mouth 2 (two) times daily. 30 tablet Lynden Oxford Scales, PA-C   doxycycline (VIBRAMYCIN) 100 MG capsule Take 1 capsule (100 mg total) by mouth 2 (two) times daily. 60 capsule Lynden Oxford Scales, PA-C   fluconazole (DIFLUCAN) 150 MG tablet Take 1 tablet (150 mg total) by mouth daily for 7 days. 7 tablet Lynden Oxford Scales, PA-C   ipratropium (ATROVENT) 0.03 % nasal spray Place 2 sprays into both nostrils every 12 (twelve) hours. 30 mL Lynden Oxford Scales, PA-C   montelukast (SINGULAIR) 10 MG tablet Take 1 tablet (10 mg total) by mouth at bedtime. 30 tablet Lynden Oxford Scales, PA-C      PDMP not reviewed this encounter.    Lynden Oxford Scales, [place] 11/08/20 [phone]

## 2020-11-08 NOTE — ED Triage Notes (Signed)
Pt presents with complaints of facial pressure and drainage x 2 days. Pt is concerned for sinus infection. Pt has tried sinus otc medication with no relief.

## 2020-11-10 LAB — COVID-19, FLU A+B AND RSV
Influenza A, NAA: NOT DETECTED
Influenza B, NAA: NOT DETECTED
RSV, NAA: NOT DETECTED
SARS-CoV-2, NAA: NOT DETECTED

## 2021-02-01 ENCOUNTER — Other Ambulatory Visit: Payer: Self-pay

## 2021-02-01 ENCOUNTER — Ambulatory Visit
Admission: EM | Admit: 2021-02-01 | Discharge: 2021-02-01 | Disposition: A | Discharge status: Home / Self Care | Attending: Emergency Medicine | Admitting: Emergency Medicine

## 2021-02-01 DIAGNOSIS — J029 Acute pharyngitis, unspecified: Secondary | ICD-10-CM | POA: Diagnosis not present

## 2021-02-01 DIAGNOSIS — Z20822 Contact with and (suspected) exposure to covid-19: Secondary | ICD-10-CM | POA: Diagnosis not present

## 2021-02-01 LAB — POCT RAPID STREP A (OFFICE): Rapid Strep A Screen: NEGATIVE

## 2021-02-01 NOTE — ED Provider Notes (Signed)
[facility]    CSN: [id number] Arrival date & time: 02/01/21  0845    HISTORY   Chief Complaint  Patient presents with   Sore Throat   HPI Charlene Griffin is a 31 y.o. female. Pt reports dry scratchy throat last night and raspy voice this morning, states is very painful to swallow.  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    The history is provided by the patient.  Past Medical History:  Diagnosis Date   ADHD    Allergy    Alopecia    Anxiety    Bronchitis    Celiac disease    Complication of anesthesia    intubation bronchitis   GERD (gastroesophageal reflux disease)    rare   Hemorrhoids    Immune deficiency disorder ([place])    Insomnia    Major depression    Migraine with aura    Panic attack    with surgery   PCOS (polycystic ovarian syndrome)    PONV (postoperative nausea and vomiting)    Tinnitus    Umbilical hernia    Patient Active Problem List   Diagnosis Date Noted   Incisional hernia 09/02/2019   Migraine with aura    Non-seasonal allergic rhinitis 04/04/2018   Tinnitus of both ears 04/04/2018   Alopecia areata 02/08/2017   Enlarged thyroid gland 02/08/2017   Anxiety 12/11/2015   Attention deficit hyperactivity disorder (ADHD) 12/11/2015   Past Surgical History:  Procedure Laterality Date   HERNIA REPAIR     x 2   INCISIONAL HERNIA REPAIR N/A 09/02/2019   Procedure: LAPAROSCOPIC INCISIONAL HERNIA REPAIR WITH MESH;  Surgeon: Kinsinger, Arta Bruce, MD;  Location: [facility];  Service: General;  Laterality: N/A;   LAPAROSCOPY N/A 12/29/2018   Procedure: LAPAROSCOPY DIAGNOSTIC;  Surgeon: Salvadore Dom, MD;  Location: [place];  Service: Gynecology;   Laterality: N/A;  1 hour surgery time/ follow Dr Elza Rafter case   LEFT OOPHORECTOMY     OVARIAN CYST REMOVAL Left 12/29/2018   Procedure: LAPARASCOPIC OOPHARECTOMY;  Surgeon: Salvadore Dom, MD;  Location: [facility];  Service: Gynecology;  Laterality: Left;   WISDOM TOOTH EXTRACTION     OB History     Gravida  0   Para  0   Term  0   Preterm  0   AB  0   Living  0      SAB  0   IAB  0   Ectopic  0   Multiple  0   Live Births  0          Home Medications    Prior to Admission medications   Medication Sig Start Date End Date Taking? Authorizing Provider  Cetirizine HCl 10 MG CAPS Take 1 capsule (10 mg total) by mouth daily for 10 days. 08/23/20 09/02/20  Wieters, Hallie C, PA-C  ipratropium (ATROVENT) 0.03 % nasal spray Place 2 sprays into both nostrils every 12 (twelve) hours. 11/08/20   Lynden Oxford Scales, PA-C  montelukast (SINGULAIR) 10 MG tablet Take 1 tablet (10 mg total) by mouth at bedtime. 11/08/20   Lynden Oxford Scales, PA-C  naproxen (NAPROSYN) 500 MG tablet Take 1 tablet (500 mg total) by mouth  2 (two) times daily. 11/08/20   Lynden Oxford Scales, PA-C   Family History Family History  Problem Relation Age of Onset   Colon cancer Maternal Grandfather        passed away 60   Healthy Mother    Healthy Father    Asthma Sister    Factor V Leiden deficiency Sister    Ovarian cancer Paternal Grandmother    Social History Social History   Tobacco Use   Smoking status: Some Days    Types: E-cigarettes   Smokeless tobacco: Never  Vaping Use   Vaping Use: Some days   Substances: Nicotine, Flavoring  Substance Use Topics   Alcohol use: Yes    Comment: socially   Drug use: Never   Allergies   Botox [botulinum toxin type a] and Gluten meal  Review of Systems Review of Systems Pertinent findings noted in history of present illness.   Physical Exam Triage Vital Signs ED Triage Vitals  Enc Vitals Group     BP  11/04/20 0827 (!) 147/82     Pulse Rate 11/04/20 0827 72     Resp 11/04/20 0827 18     Temp 11/04/20 0827 98.3 F (36.8 C)     Temp Source 11/04/20 0827 Oral     SpO2 11/04/20 0827 98 %     Weight --      Height --      Head Circumference --      Peak Flow --      Pain Score 11/04/20 0826 5     Pain Loc --      Pain Edu? --      Excl. in [place]? --   No data found.  Updated Vital Signs BP 110/76 (BP Location: Right Arm)    Pulse 60    Temp 97.6 F (36.4 C) (Oral)    Resp 20    LMP 01/15/2021 (Exact Date)    SpO2 99%   Physical Exam Vitals and nursing note reviewed.  Constitutional:      General: She is not in acute distress.    Appearance: Normal appearance. She is not ill-appearing.  HENT:     Head: Normocephalic and atraumatic.     Salivary Glands: Right salivary gland is not diffusely enlarged or tender. Left salivary gland is not diffusely enlarged or tender.     Right Ear: Tympanic membrane, ear canal and external ear normal. No drainage. No middle ear effusion. There is no impacted cerumen. Tympanic membrane is not erythematous or bulging.     Left Ear: Tympanic membrane, ear canal and external ear normal. No drainage.  No middle ear effusion. There is no impacted cerumen. Tympanic membrane is not erythematous or bulging.     Nose: Nose normal. No nasal deformity, septal deviation, mucosal edema, congestion or rhinorrhea.     Right Turbinates: Not enlarged, swollen or pale.     Left Turbinates: Not enlarged, swollen or pale.     Right Sinus: No maxillary sinus tenderness or frontal sinus tenderness.     Left Sinus: No maxillary sinus tenderness or frontal sinus tenderness.     Mouth/Throat:     Lips: Pink. No lesions.     Mouth: Mucous membranes are moist. No oral lesions.     Pharynx: Oropharynx is clear. Uvula midline. Posterior oropharyngeal erythema present. No uvula swelling.     Tonsils: No tonsillar exudate. 0 on the right. 0 on the left.  Eyes:     General: Lids  are normal.        Right eye: No discharge.        Left eye: No discharge.     Extraocular Movements: Extraocular movements intact.     Conjunctiva/sclera: Conjunctivae normal.     Right eye: Right conjunctiva is not injected.     Left eye: Left conjunctiva is not injected.  Neck:     Trachea: Trachea and phonation normal.  Cardiovascular:     Rate and Rhythm: Normal rate and regular rhythm.     Pulses: Normal pulses.     Heart sounds: Normal heart sounds. No murmur heard.   No friction rub. No gallop.  Pulmonary:     Effort: Pulmonary effort is normal. No accessory muscle usage, prolonged expiration or respiratory distress.     Breath sounds: Normal breath sounds. No stridor, decreased air movement or transmitted upper airway sounds. No decreased breath sounds, wheezing, rhonchi or rales.  Chest:     Chest wall: No tenderness.  Musculoskeletal:        General: Normal range of motion.     Cervical back: Normal range of motion and neck supple. Normal range of motion.  Lymphadenopathy:     Cervical: No cervical adenopathy.  Skin:    General: Skin is warm and dry.     Findings: No erythema or rash.  Neurological:     General: No focal deficit present.     Mental Status: She is alert and oriented to person, place, and time.  Psychiatric:        Mood and Affect: Mood normal.        Behavior: Behavior normal.    Visual Acuity Right Eye Distance:   Left Eye Distance:   Bilateral Distance:    Right Eye Near:   Left Eye Near:    Bilateral Near:     UC Couse / Diagnostics / Procedures:    EKG  Radiology No results found.  Procedures Procedures (including critical care time)  UC Diagnoses / Final Clinical Impressions(s)   I have reviewed the triage vital signs and the nursing notes.  Pertinent labs & imaging results that were available during my care of the patient were reviewed by me and considered in my medical decision making (see chart for details).   Final  diagnoses:  Exposure to COVID-19 virus  Acute pharyngitis, unspecified etiology   Patient is well-appearing on exam with mild erythema in her throat.  COVID and strep testing was performed today.  Rapid strep test was negative, throat culture will be performed per protocol.  Culture is positive we will provide her with antibiotics.  Patient provided with a note for work.  Patient advised that if COVID test is negative today it could potentially be due to testing too soon, have advised her that if she still feels unwell in 3 days to return for repeat testing.  Conservative care recommended, supportive medications advised.  Return precautions advised.  ED Prescriptions   None    PDMP not reviewed this encounter.  Pending results:  Labs Reviewed  NOVEL CORONAVIRUS, NAA  CULTURE, GROUP A STREP [facility])  POCT RAPID STREP A (OFFICE)    Medications Ordered in UC: Medications - No data to display  Disposition Upon Discharge:  Condition: stable for discharge home Home: take medications as prescribed; routine discharge instructions as discussed; follow up as advised.  Patient presented with an acute illness with associated systemic symptoms and significant discomfort requiring urgent management. In my opinion, this is  a condition that a prudent lay person (someone who possesses an average knowledge of health and medicine) may potentially expect to result in complications if not addressed urgently such as respiratory distress, impairment of bodily function or dysfunction of bodily organs.   Routine symptom specific, illness specific and/or disease specific instructions were discussed with the patient and/or caregiver at length.   As such, the patient has been evaluated and assessed, work-up was performed and treatment was provided in alignment with urgent care protocols and evidence based medicine.  Patient/parent/caregiver has been advised that the patient may require follow up for further testing  and treatment if the symptoms continue in spite of treatment, as clinically indicated and appropriate.  If the patient was tested for COVID-19, Influenza and/or RSV, then the patient/parent/guardian was advised to isolate at home pending the results of his/her diagnostic coronavirus test and potentially longer if theyre positive. I have also advised pt that if his/her COVID-19 test returns positive, it's recommended to self-isolate for at least 10 days after symptoms first appeared AND until fever-free for 24 hours without fever reducer AND other symptoms have improved or resolved. Discussed self-isolation recommendations as well as instructions for household member/close contacts as per the [facility] and [place] DHHS, and also gave patient the [place] packet with this information.  Patient/parent/caregiver has been advised to return to the [facility] or PCP in 3-5 days if no better; to PCP or the Emergency Department if new signs and symptoms develop, or if the current signs or symptoms continue to change or worsen for further workup, evaluation and treatment as clinically indicated and appropriate  The patient will follow up with their current PCP if and as advised. If the patient does not currently have a PCP we will assist them in obtaining one.   The patient may need specialty follow up if the symptoms continue, in spite of conservative treatment and management, for further workup, evaluation, consultation and treatment as clinically indicated and appropriate.  Patient/parent/caregiver verbalized understanding and agreement of plan as discussed.  All questions were addressed during visit.  Please see discharge instructions below for further details of plan.  Discharge Instructions:   Discharge Instructions      You were tested for COVID-19 and streptococcal pharyngitis today.  The result of your COVID-19 testing will be posted to your MyChart once it is complete, this typically takes 24 to 48 hours.  If there is  a positive result, you will be contacted by phone with further recommendations, if any.   If her COVID test is negative and you continue to have significant symptoms, particularly worsening symptoms, after 3 days of rest, please return for repeat COVID-19 test.  Your rapid strep test today was negative, throat culture will be performed per our protocol.  Culture typically takes 3 to 5 days and the result will be posted to your MyChart initially.  There is a positive result, you will be contacted by phone and antibiotics will be prescribed for you.   Please see the list below for recommended medications, dosages and frequencies to provide relief of your current symptoms:     Ibuprofen  (Advil, Motrin): This is a good anti-inflammatory medication which addresses aches, pains and inflammation of the upper airways that causes sinus and nasal congestion as well as in the lower airways which makes your cough feel tight and sometimes burn.  I recommend that you take between 400 to 600 mg every 6-8 hours as needed.      Acetaminophen (  Tylenol): This is a good fever reducer.  If your body temperature rises above 101.5 as measured with a thermometer, it is recommended that you take 1,000 mg every 8 hours until your temperature falls below 101.5, please not take more than 3,000 mg of acetaminophen either as a separate medication or as in ingredient in an over-the-counter cold/flu preparation within a 24-hour period.      Chloraseptic Throat Spray: Spray 5 sprays into affected area every 2 hours, hold for 15 seconds and either swallow or spit it out.  This is a excellent numbing medication because it is a spray, you can put it right where you needed and so sucking on a lozenge and numbing your entire mouth.      Conservative care is also recommended at this time.  This includes rest, pushing clear fluids and activity as tolerated.  Warm beverages such as teas and broths versus cold beverages/popsicles and frozen  sherbet/sorbet are your choice, both warm and cold are beneficial.  You may also notice that your appetite is reduced; this is okay as long as you are drinking plenty of clear fluids.   Please follow-up within the next 3 to 5 days either with your primary care provider or urgent care if your symptoms do not resolve.  If you do not have a primary care provider, we will assist you in finding one.  Thank you for visiting urgent care today.  I appreciate the opportunity to participate in your care.      This office note has been dictated using Museum/gallery curator.  Unfortunately, and despite my best efforts, this method of dictation can sometimes lead to occasional typographical or grammatical errors.  I apologize in advance if this occurs.     Lynden Oxford Scales, PA-C 02/01/21 1237

## 2021-02-01 NOTE — Discharge Instructions (Addendum)
You were tested for COVID-19 and streptococcal pharyngitis today.  The result of your COVID-19 testing will be posted to your MyChart once it is complete, this typically takes 24 to 48 hours.  If there is a positive result, you will be contacted by phone with further recommendations, if any.   If her COVID test is negative and you continue to have significant symptoms, particularly worsening symptoms, after 3 days of rest, please return for repeat COVID-19 test.  Your rapid strep test today was negative, throat culture will be performed per our protocol.  Culture typically takes 3 to 5 days and the result will be posted to your MyChart initially.  There is a positive result, you will be contacted by phone and antibiotics will be prescribed for you.   Please see the list below for recommended medications, dosages and frequencies to provide relief of your current symptoms:     Ibuprofen  (Advil, Motrin): This is a good anti-inflammatory medication which addresses aches, pains and inflammation of the upper airways that causes sinus and nasal congestion as well as in the lower airways which makes your cough feel tight and sometimes burn.  I recommend that you take between 400 to 600 mg every 6-8 hours as needed.      Acetaminophen (Tylenol): This is a good fever reducer.  If your body temperature rises above 101.5 as measured with a thermometer, it is recommended that you take 1,000 mg every 8 hours until your temperature falls below 101.5, please not take more than 3,000 mg of acetaminophen either as a separate medication or as in ingredient in an over-the-counter cold/flu preparation within a 24-hour period.      Chloraseptic Throat Spray: Spray 5 sprays into affected area every 2 hours, hold for 15 seconds and either swallow or spit it out.  This is a excellent numbing medication because it is a spray, you can put it right where you needed and so sucking on a lozenge and numbing your entire mouth.       Conservative care is also recommended at this time.  This includes rest, pushing clear fluids and activity as tolerated.  Warm beverages such as teas and broths versus cold beverages/popsicles and frozen sherbet/sorbet are your choice, both warm and cold are beneficial.  You may also notice that your appetite is reduced; this is okay as long as you are drinking plenty of clear fluids.   Please follow-up within the next 3 to 5 days either with your primary care provider or urgent care if your symptoms do not resolve.  If you do not have a primary care provider, we will assist you in finding one.  Thank you for visiting urgent care today.  I appreciate the opportunity to participate in your care.

## 2021-02-01 NOTE — ED Triage Notes (Signed)
Pt reports dry scratchy throat last night and raspy voice this morning.  No fever, body aches, cough, etc.  COVID exposure Friday and Monday, other person tested positive today.

## 2021-02-02 LAB — SARS-COV-2, NAA 2 DAY TAT

## 2021-02-02 LAB — NOVEL CORONAVIRUS, NAA: SARS-CoV-2, NAA: NOT DETECTED

## 2021-02-03 LAB — CULTURE, GROUP A STREP (THRC)

## 2021-02-22 ENCOUNTER — Emergency Department (HOSPITAL_BASED_OUTPATIENT_CLINIC_OR_DEPARTMENT_OTHER)
Admission: EM | Admit: 2021-02-22 | Discharge: 2021-02-22 | Disposition: A | Discharge status: Home / Self Care | Attending: Emergency Medicine | Admitting: Emergency Medicine

## 2021-02-22 ENCOUNTER — Encounter (HOSPITAL_BASED_OUTPATIENT_CLINIC_OR_DEPARTMENT_OTHER): Payer: Self-pay | Admitting: Emergency Medicine

## 2021-02-22 ENCOUNTER — Other Ambulatory Visit: Payer: Self-pay

## 2021-02-22 DIAGNOSIS — N939 Abnormal uterine and vaginal bleeding, unspecified: Secondary | ICD-10-CM | POA: Diagnosis present

## 2021-02-22 LAB — COMPREHENSIVE METABOLIC PANEL
ALT: 23 U/L (ref 0–44)
AST: 24 U/L (ref 15–41)
Albumin: 4.6 g/dL (ref 3.5–5.0)
Alkaline Phosphatase: 60 U/L (ref 38–126)
Anion gap: 10 (ref 5–15)
BUN: 15 mg/dL (ref 6–20)
CO2: 27 mmol/L (ref 22–32)
Calcium: 9.7 mg/dL (ref 8.9–10.3)
Chloride: 97 mmol/L — ABNORMAL LOW (ref 98–111)
Creatinine, Ser: 0.76 mg/dL (ref 0.44–1.00)
GFR, Estimated: >60 mL/min (ref >60)
Glucose, Bld: 89 mg/dL (ref 70–99)
Potassium: 4 mmol/L (ref 3.5–5.1)
Sodium: 134 mmol/L — ABNORMAL LOW (ref 135–145)
Total Bilirubin: 0.5 mg/dL (ref 0.3–1.2)
Total Protein: 8.3 g/dL — ABNORMAL HIGH (ref 6.5–8.1)

## 2021-02-22 LAB — CBC WITH DIFFERENTIAL/PLATELET
Abs Immature Granulocytes: 0.01 10*3/uL (ref 0.00–0.07)
Basophils Absolute: 0 10*3/uL (ref 0.0–0.1)
Basophils Relative: 1 %
Eosinophils Absolute: 0.2 10*3/uL (ref 0.0–0.5)
Eosinophils Relative: 2 %
HCT: 40.5 % (ref 36.0–46.0)
Hemoglobin: 14 g/dL (ref 12.0–15.0)
Immature Granulocytes: 0 %
Lymphocytes Relative: 34 %
Lymphs Abs: 2.5 10*3/uL (ref 0.7–4.0)
MCH: 31.1 pg (ref 26.0–34.0)
MCHC: 34.6 g/dL (ref 30.0–36.0)
MCV: 90 fL (ref 80.0–100.0)
Monocytes Absolute: 0.7 10*3/uL (ref 0.1–1.0)
Monocytes Relative: 9 %
Neutro Abs: 4 10*3/uL (ref 1.7–7.7)
Neutrophils Relative %: 54 %
Platelets: 304 10*3/uL (ref 150–400)
RBC: 4.5 MIL/uL (ref 3.87–5.11)
RDW: 12.7 % (ref 11.5–15.5)
WBC: 7.4 10*3/uL (ref 4.0–10.5)
nRBC: 0 % (ref 0.0–0.2)

## 2021-02-22 NOTE — ED Triage Notes (Signed)
Pt reports vaginal bleeding that is more than normal for her; she is being followed byOB GYN for same; they suggested she come here for bloodwork

## 2021-02-22 NOTE — ED Provider Notes (Signed)
[facility] Provider Note   CSN: [id number] Arrival date & time: 02/22/21  1805     History Chief Complaint  Patient presents with   Vaginal Bleeding    Charlene Griffin is a 31 y.o. female history of PCOS s/p left oophorectomy who presents the emergency department with vaginal bleeding for the last 4 to 5 days.  Patient is a longstanding history of vaginal bleeding and has been bleeding chronically for the last 3 months.  Patient is being worked up with her OB/GYN.  Her last 3 days her bleeding has significantly increased and she is passing a lot of bright red blood with clots.  She notified her OB/GYN who recommended coming in to check blood work.  Patient denies any pain, she denies any fevers, chills, nausea, vomiting, diarrhea, urinary complaints.  No pelvic pain.  Patient is on birth control from her OB/GYN.  Patient was instructed by her OB/GYN to call them tomorrow after they got results.   Vaginal Bleeding     Home Medications Prior to Admission medications   Medication Sig Start Date End Date Taking? Authorizing Provider  dicyclomine (BENTYL) 20 MG tablet Take 20 mg by mouth every 6 (six) hours.    [provider]  DULoxetine (CYMBALTA) 60 MG capsule Take 60 mg by mouth daily.    [provider]  montelukast (SINGULAIR) 10 MG tablet Take 1 tablet (10 mg total) by mouth at bedtime. 11/08/20   Lynden Oxford Scales, PA-C  naproxen (NAPROSYN) 500 MG tablet Take 1 tablet (500 mg total) by mouth 2 (two) times daily. 11/08/20   Lynden Oxford Scales, PA-C      Allergies    Botox [botulinum toxin type a] and Gluten meal    Review of Systems   Review of Systems  Genitourinary:  Positive for vaginal bleeding.  All other systems reviewed and are negative.  Physical Exam Updated Vital Signs BP 120/75 (BP Location: Left Arm)    Pulse 72    Temp 98.3 F (36.8 C) (Oral)    Resp 18    Ht 5\' 6"  (1.676 m)    Wt 73.9 kg    SpO2  100%    BMI 26.31 kg/m  Physical Exam Vitals and nursing note reviewed.  Constitutional:      Appearance: Normal appearance.  HENT:     Head: Normocephalic and atraumatic.  Eyes:     General:        Right eye: No discharge.        Left eye: No discharge.     Conjunctiva/sclera: Conjunctivae normal.  Pulmonary:     Effort: Pulmonary effort is normal.  Skin:    General: Skin is warm and dry.     Findings: No rash.  Neurological:     General: No focal deficit present.     Mental Status: She is alert.  Psychiatric:        Mood and Affect: Mood normal.        Behavior: Behavior normal.    ED Results / Procedures / Treatments   Labs (all labs ordered are listed, but only abnormal results are displayed) Labs Reviewed  COMPREHENSIVE METABOLIC PANEL - Abnormal; Notable for the following components:      Result Value   Sodium 134 (*)    Chloride 97 (*)    Total Protein 8.3 (*)    All other components within normal limits  CBC WITH DIFFERENTIAL/PLATELET    EKG None  Radiology No results found.  Procedures Procedures    Medications Ordered in ED Medications - No data to display  ED Course/ Medical Decision Making/ A&P                           Medical Decision Making Amount and/or Complexity of Data Reviewed Labs: ordered.   This patient presents to the ED for concern of anemia from vaginal bleeding, this involves an extensive number of treatment options, and is a complaint that carries with it a high risk of complications and morbidity.  The differential diagnosis includes anemia.   Co morbidities that complicate the patient evaluation  PCOS Left oophorectomy Fibromyalgia   Additional history obtained:  External records from outside source obtained and reviewed including records from OB/GYN office.  Patient has had irregular bleeding for some time.   Lab Tests:  I Ordered, and personally interpreted labs.  The pertinent results include: CBC which did  not reveal any leukocytosis or anemia.  CMP with mild hyponatremia otherwise normal.  Patient not having pain and I do not feel that a pelvic ultrasound will be necessary at this time.   Cardiac Monitoring:  The patient was maintained on a cardiac monitor.  I personally viewed and interpreted the cardiac monitored which showed an underlying rhythm of: Normal sinus rhythm.  Normal rate.  Oxygenate well on room air.   Test Considered:  Pelvic ultrasound   Problem List / ED Course:  Vaginal Bleeding   Reevaluation:  After the interventions noted above, I reevaluated the patient and found that they have :improved   Social Determinants of Health:  None   Dispostion:  After consideration of the diagnostic results and the patients response to treatment, I feel that the patent would benefit from outpatient follow-up.  She will call her OB/GYN tomorrow and follow-up with them.  Her vaginal bleeding has resolved since has been in the department.  No evidence of anemia.  Patient is otherwise well-appearing.  Strict turn precautions were discussed with her at bedside.  She is safer discharge.  Final Clinical Impression(s) / ED Diagnoses Final diagnoses:  Vaginal bleeding    Rx / DC Orders ED Discharge Orders     None         Cherrie Gauze 02/22/21 1949    Drenda Freeze, MD 02/24/21 1459

## 2021-02-22 NOTE — Discharge Instructions (Signed)
Please follow-up with your OB/GYN.  Return to the emergency department for worsening symptoms.

## 2021-03-03 ENCOUNTER — Telehealth: Payer: Self-pay

## 2021-03-03 NOTE — Telephone Encounter (Signed)
NOTES SCANNED TO REFERRAL 

## 2021-03-23 NOTE — Progress Notes (Signed)
? ? ?Charlene Conners, NP ?Reason for referral-near syncope ? ?HPI: 31 year old female for evaluation of near syncope at request of [name], NP.  Patient seen at [facility] February 2023 for presyncope.  At that time she apparently was having heavy vaginal bleeding and also stated that these episodes occur when she has fibromyalgia flareups.  At that time hemoglobin was 13.8, sodium 136 with potassium 4.2, BUN 11, creatinine 0.74 and electrocardiogram interpreted as normal (no copy available for my review).  Most recent laboratories March 2017 showed hemoglobin 14, potassium 3.4, creatinine 0.80, normal liver functions.  Cardiology now asked to evaluate.  Patient has had several syncopal or near syncopal episodes over the past 10 months.  Each of these episodes occurred while she was having significant pain in her low back and hips.  There was no preceding nausea, dyspnea, palpitations or chest pain.  Her first episode in June 2022 had a duration of approximately 30 minutes.  Subsequent episodes have been in the 15 to 20-minute range.  Otherwise she has some dyspnea on exertion but no orthopnea, PND, pedal edema, exertional chest pain.  No palpitations.  She has had difficulties with diarrhea and heavy menstrual cycles.  2 of her episodes occurred during menstruation. ? ?Current Outpatient Medications  ?Medication Sig Dispense Refill  ? albuterol (PROVENTIL) (2.5 MG/3ML) 0.083% nebulizer solution Inhale into the lungs as needed.    ? dicyclomine (BENTYL) 20 MG tablet Take 20 mg by mouth every 6 (six) hours.    ? DULoxetine (CYMBALTA) 60 MG capsule Take 60 mg by mouth daily.    ? montelukast (SINGULAIR) 10 MG tablet Take 1 tablet (10 mg total) by mouth at bedtime. 30 tablet 0  ? naproxen (NAPROSYN) 500 MG tablet Take 1 tablet (500 mg total) by mouth 2 (two) times daily. 30 tablet 0  ? ?No current facility-administered medications for this visit.  ? ? ?Allergies  ?Allergen Reactions  ? Botox [Botulinum  Toxin Type A] Rash and Other (See Comments)  ?  Mood changes   ? Gluten Meal   ?  Blister & hives  ? ? ? ?Past Medical History:  ?Diagnosis Date  ? ADHD   ? Allergy   ? Alopecia   ? Anxiety   ? Bronchitis   ? Celiac disease   ? Complication of anesthesia   ? intubation bronchitis  ? GERD (gastroesophageal reflux disease)   ? rare  ? Hemorrhoids   ? Immune deficiency disorder ([place])   ? Insomnia   ? Major depression   ? Migraine with aura   ? Panic attack   ? with surgery  ? PCOS (polycystic ovarian syndrome)   ? PONV (postoperative nausea and vomiting)   ? Tinnitus   ? Umbilical hernia   ? ? ?Past Surgical History:  ?Procedure Laterality Date  ? HERNIA REPAIR    ? x 2  ? INCISIONAL HERNIA REPAIR N/A 09/02/2019  ? Procedure: LAPAROSCOPIC INCISIONAL HERNIA REPAIR WITH MESH;  Surgeon: Kinsinger, Arta Bruce, MD;  Location: [facility];  Service: General;  Laterality: N/A;  ? LAPAROSCOPY N/A 12/29/2018  ? Procedure: LAPAROSCOPY DIAGNOSTIC;  Surgeon: Salvadore Dom, MD;  Location: [facility];  Service: Gynecology;  Laterality: N/A;  1 hour surgery time/ follow Dr Elza Rafter case  ? LEFT OOPHORECTOMY    ? OVARIAN CYST REMOVAL Left 12/29/2018  ? Procedure: LAPARASCOPIC OOPHARECTOMY;  Surgeon: Salvadore Dom, MD;  Location: [facility];  Service: Gynecology;  Laterality: Left;  ?  WISDOM TOOTH EXTRACTION    ? ? ?Social History  ? ?Socioeconomic History  ? Marital status: Married  ?  Spouse name: Not on file  ? Number of children: Not on file  ? Years of education: Not on file  ? Highest education level: Not on file  ?Occupational History  ? Not on file  ?Tobacco Use  ? Smoking status: Some Days  ?  Types: Cigarettes  ? Smokeless tobacco: Never  ?Vaping Use  ? Vaping Use: Some days  ? Substances: Nicotine, Flavoring  ?Substance and Sexual Activity  ? Alcohol use: Yes  ?  Comment: socially  ? Drug use: Never  ? Sexual activity: Yes  ?  Birth control/protection: Pill  ?Other Topics Concern  ? Not  on file  ?Social History Narrative  ? Not on file  ? ?Social Determinants of Health  ? ?Financial Resource Strain: Not on file  ?Food Insecurity: Not on file  ?Transportation Needs: Not on file  ?Physical Activity: Not on file  ?Stress: Not on file  ?Social Connections: Not on file  ?Intimate Partner Violence: Not on file  ? ? ?Family History  ?Problem Relation Age of Onset  ? Colon cancer Maternal Grandfather   ?     passed away 04/28/12  ? Healthy Mother   ? Healthy Father   ? Asthma Sister   ? Factor V Leiden deficiency Sister   ? Ovarian cancer Paternal Grandmother   ? ? ?ROS: Diarrhea, heavy menstrual cycles, low back and hip pain but no fevers or chills, productive cough, hemoptysis, dysphasia, odynophagia, melena, hematochezia, dysuria, hematuria, rash, seizure activity, orthopnea, PND, pedal edema, claudication. Remaining systems are negative. ? ?Physical Exam:  ? ?Blood pressure 118/64, pulse 77, height 5\' 6"  (1.676 m), weight 159 lb 12.8 oz (72.5 kg), SpO2 99 %. ? ?General:  Well developed/well nourished in NAD ?Skin warm/dry ?Patient not depressed ?No peripheral clubbing ?Back-normal ?HEENT-normal/normal eyelids ?Neck supple/normal carotid upstroke bilaterally; no bruits; no JVD; no thyromegaly ?chest - CTA/ normal expansion ?CV - RRR/normal S1 and S2; no murmurs, rubs or gallops;  PMI nondisplaced ?Abdomen -NT/ND, no HSM, no mass, + bowel sounds, no bruit ?2+ femoral pulses, no bruits ?Ext-no edema, chords, 2+ DP ?Neuro-grossly nonfocal ? ?ECG -normal sinus rhythm at a rate of 77, no ST changes.  Normal QT interval.  Personally reviewed ? ?A/P ? ?1 syncope/near syncope-etiology unclear.  Her electrocardiogram is normal.  Some of her descriptions sound potentially to be vagally mediated related to low back pain and hip pain.  However duration would seem inconsistent with this as it is in the 15-minute to 30-minute range.  She also apparently was having some symptoms when she was in the emergency room and no  clear arrhythmias documented.  I will arrange an echocardiogram to assess LV function.  If normal we will not pursue further cardiac evaluation.  I also asked her to maintain hydration and increase salt intake. ? ?Kirk Ruths, MD ? ?

## 2021-04-03 ENCOUNTER — Emergency Department (HOSPITAL_BASED_OUTPATIENT_CLINIC_OR_DEPARTMENT_OTHER)
Admission: EM | Admit: 2021-04-03 | Discharge: 2021-04-04 | Disposition: A | Discharge status: Home / Self Care | Attending: Emergency Medicine | Admitting: Emergency Medicine

## 2021-04-03 ENCOUNTER — Encounter (HOSPITAL_BASED_OUTPATIENT_CLINIC_OR_DEPARTMENT_OTHER): Payer: Self-pay

## 2021-04-03 ENCOUNTER — Emergency Department (HOSPITAL_COMMUNITY)

## 2021-04-03 ENCOUNTER — Other Ambulatory Visit: Payer: Self-pay

## 2021-04-03 DIAGNOSIS — M62838 Other muscle spasm: Secondary | ICD-10-CM | POA: Insufficient documentation

## 2021-04-03 DIAGNOSIS — K649 Unspecified hemorrhoids: Secondary | ICD-10-CM | POA: Diagnosis not present

## 2021-04-03 DIAGNOSIS — R202 Paresthesia of skin: Secondary | ICD-10-CM | POA: Diagnosis not present

## 2021-04-03 DIAGNOSIS — R32 Unspecified urinary incontinence: Secondary | ICD-10-CM | POA: Insufficient documentation

## 2021-04-03 DIAGNOSIS — M545 Low back pain, unspecified: Secondary | ICD-10-CM | POA: Insufficient documentation

## 2021-04-03 DIAGNOSIS — R200 Anesthesia of skin: Secondary | ICD-10-CM | POA: Diagnosis not present

## 2021-04-03 DIAGNOSIS — G8929 Other chronic pain: Secondary | ICD-10-CM

## 2021-04-03 DIAGNOSIS — R159 Full incontinence of feces: Secondary | ICD-10-CM | POA: Insufficient documentation

## 2021-04-03 LAB — COMPREHENSIVE METABOLIC PANEL
ALT: 20 U/L (ref 0–44)
AST: 21 U/L (ref 15–41)
Albumin: 4.4 g/dL (ref 3.5–5.0)
Alkaline Phosphatase: 53 U/L (ref 38–126)
Anion gap: 9 (ref 5–15)
BUN: 11 mg/dL (ref 6–20)
CO2: 22 mmol/L (ref 22–32)
Calcium: 9.1 mg/dL (ref 8.9–10.3)
Chloride: 103 mmol/L (ref 98–111)
Creatinine, Ser: 0.8 mg/dL (ref 0.44–1.00)
GFR, Estimated: >60 mL/min (ref >60)
Glucose, Bld: 88 mg/dL (ref 70–99)
Potassium: 3.4 mmol/L — ABNORMAL LOW (ref 3.5–5.1)
Sodium: 134 mmol/L — ABNORMAL LOW (ref 135–145)
Total Bilirubin: 0.5 mg/dL (ref 0.3–1.2)
Total Protein: 8 g/dL (ref 6.5–8.1)

## 2021-04-03 LAB — CBC WITH DIFFERENTIAL/PLATELET
Abs Immature Granulocytes: 0.02 10*3/uL (ref 0.00–0.07)
Basophils Absolute: 0 10*3/uL (ref 0.0–0.1)
Basophils Relative: 0 %
Eosinophils Absolute: 0.1 10*3/uL (ref 0.0–0.5)
Eosinophils Relative: 2 %
HCT: 39.3 % (ref 36.0–46.0)
Hemoglobin: 14 g/dL (ref 12.0–15.0)
Immature Granulocytes: 0 %
Lymphocytes Relative: 28 %
Lymphs Abs: 2.3 10*3/uL (ref 0.7–4.0)
MCH: 31.5 pg (ref 26.0–34.0)
MCHC: 35.6 g/dL (ref 30.0–36.0)
MCV: 88.5 fL (ref 80.0–100.0)
Monocytes Absolute: 0.5 10*3/uL (ref 0.1–1.0)
Monocytes Relative: 7 %
Neutro Abs: 5.3 10*3/uL (ref 1.7–7.7)
Neutrophils Relative %: 63 %
Platelets: 326 10*3/uL (ref 150–400)
RBC: 4.44 MIL/uL (ref 3.87–5.11)
RDW: 12.9 % (ref 11.5–15.5)
WBC: 8.3 10*3/uL (ref 4.0–10.5)
nRBC: 0 % (ref 0.0–0.2)

## 2021-04-03 LAB — URINALYSIS, ROUTINE W REFLEX MICROSCOPIC
Bilirubin Urine: NEGATIVE
Glucose, UA: NEGATIVE mg/dL
Hgb urine dipstick: NEGATIVE
Ketones, ur: NEGATIVE mg/dL
Leukocytes,Ua: NEGATIVE
Nitrite: NEGATIVE
Protein, ur: NEGATIVE mg/dL
Specific Gravity, Urine: 1.025 (ref 1.005–1.030)
pH: 7 (ref 5.0–8.0)

## 2021-04-03 LAB — PREGNANCY, URINE: Preg Test, Ur: NEGATIVE

## 2021-04-03 MED ORDER — LORAZEPAM 2 MG/ML IJ SOLN
1.0000 mg | Freq: Once | INTRAMUSCULAR | Status: AC
  Administered 2021-04-03: 1 mg via INTRAVENOUS
  Filled 2021-04-03: qty 1

## 2021-04-03 MED ORDER — GADOBUTROL 1 MMOL/ML IV SOLN
7.5000 mL | Freq: Once | INTRAVENOUS | Status: AC | PRN
  Administered 2021-04-03: 7.5 mL via INTRAVENOUS

## 2021-04-03 MED ORDER — LORAZEPAM 2 MG/ML IJ SOLN
1.0000 mg | Freq: Once | INTRAMUSCULAR | Status: AC | PRN
  Administered 2021-04-03: 1 mg via INTRAVENOUS
  Filled 2021-04-03: qty 1

## 2021-04-03 NOTE — ED Provider Notes (Signed)
?[facility] EMERGENCY DEPARTMENT ?Provider Note ? ? ?CSN: [id number] ?Arrival date & time: 04/03/21  1606 ? ?  ? ?History ? ?Chief Complaint  ?Patient presents with  ? Urinary Incontinence  ? ? ?Charlene Griffin is a 31 y.o. female. ? ?HPI ?31 year old female with a history of chronic back pain, recently diagnosed fibromyalgia (Jan 2023), celiac disease, anxiety, and PCOS presents with a chief complaint of bowel incontinence.  She states that she has chronic back pain and has followed with [facility] and had injections as recently as November 2022.  She has had worsening back pain ever since then but over the last 5 days or so its been a lot worse.  Primarily low back that radiates down the right and left side.  At times she will get tingling in her legs and sometimes feet numbness.  Sometimes she states she will get some spasms briefly in each leg.  Seems to alternate.  No fevers or IV drug abuse.  2 days ago she was trying to pass gas and defecated on herself and it was like diarrhea.  She had increased stools from 1 a day to 2 or 3 a day but they are normal in caliber.  However today just before arrival to the emergency department she was sitting on the couch and just all of a sudden had bowel incontinence with no warning or passing gas.  Went to the bathtub and when she was getting out and her husband was pulling her up she had urinary incontinence.  No abdominal pain.  No current weakness or numbness in her extremities. ? ?Home Medications ?Prior to Admission medications   ?Medication Sig Start Date End Date Taking? Authorizing Provider  ?dicyclomine (BENTYL) 20 MG tablet Take 20 mg by mouth every 6 (six) hours.    [provider]  ?DULoxetine (CYMBALTA) 60 MG capsule Take 60 mg by mouth daily.    [provider]  ?montelukast (SINGULAIR) 10 MG tablet Take 1 tablet (10 mg total) by mouth at bedtime. 11/08/20   Lynden Oxford Scales, PA-C  ?naproxen (NAPROSYN) 500 MG tablet Take 1  tablet (500 mg total) by mouth 2 (two) times daily. 11/08/20   Lynden Oxford Scales, PA-C  ?   ? ?Allergies    ?Botox [botulinum toxin type a] and Gluten meal   ? ?Review of Systems   ?Review of Systems  ?Constitutional:  Negative for fever.  ?Gastrointestinal:  Negative for abdominal pain.  ?Musculoskeletal:  Positive for back pain.  ?Neurological:  Positive for numbness (tingling). Negative for weakness.  ? ?Physical Exam ?Updated Vital Signs ?BP 124/70 (BP Location: Left Arm)   Pulse 74   Temp 97.9 ?F (36.6 ?C) (Oral)   Resp 18   Ht 5\' 6"  (1.676 m)   Wt 74.4 kg   LMP 02/20/2021   SpO2 100%   BMI 26.47 kg/m?  ?Physical Exam ?Vitals and nursing note reviewed. Exam conducted with a chaperone present.  ?Constitutional:   ?   Appearance: She is well-developed.  ?HENT:  ?   Head: Normocephalic and atraumatic.  ?Cardiovascular:  ?   Rate and Rhythm: Normal rate and regular rhythm.  ?   Heart sounds: Normal heart sounds.  ?Pulmonary:  ?   Effort: Pulmonary effort is normal.  ?   Breath sounds: Normal breath sounds.  ?Abdominal:  ?   Palpations: Abdomen is soft.  ?   Tenderness: There is no abdominal tenderness.  ?Genitourinary: ?   Comments: Small  hemorrhoids. She reports decreased sensation to light touch near her anus. She seems to have intact rectal tone ?Skin: ?   General: Skin is warm and dry.  ?Neurological:  ?   Mental Status: She is alert.  ?   Deep Tendon Reflexes: Babinski sign absent on the right side. Babinski sign absent on the left side.  ?   Reflex Scores: ?     Patellar reflexes are 3+ on the right side and 2+ on the left side. ?     Achilles reflexes are 2+ on the right side and 2+ on the left side. ?   Comments: 5/5 strength in BLE. Grossly normal sensation bilaterally. Right patellar reflexes repeatedly seems to induce non-sustained clonus.  ? ? ?ED Results / Procedures / Treatments   ?Labs ?(all labs ordered are listed, but only abnormal results are displayed) ?Labs Reviewed  ?URINALYSIS,  ROUTINE W REFLEX MICROSCOPIC  ?PREGNANCY, URINE  ?COMPREHENSIVE METABOLIC PANEL  ?CBC WITH DIFFERENTIAL/PLATELET  ? ? ?EKG ?None ? ?Radiology ?No results found. ? ?Procedures ?Procedures  ? ? ?Medications Ordered in ED ?Medications  ?LORazepam (ATIVAN) injection 1 mg (has no administration in time range)  ? ? ?ED Course/ Medical Decision Making/ A&P ?  ?                        ?Medical Decision Making ?Amount and/or Complexity of Data Reviewed ?Labs: ordered. ?Radiology: ordered. ? ? ?Patient presents with bowel and bladder incontinence.  Seems to have intact rectal tone on my exam and her bladder scan is okay without retention.  However she does seem to have clonus with her patellar reflex on the right side.  No weakness at this time.  I discussed her case with on-call teleneurology, Dr. Leonel Ramsay, who advises that this is concerning for spinal cord pathology and instead of just L-spine MRI she will need C, T, and L-spine MRIs with and without contrast.  Patient has had MRIs before and states she will just need antianxiety medicine to help get through it.  Her pregnancy test and urinalysis are normal.  Labs will be obtained but otherwise she will need to be transferred to [facility] for these MRIs.  No indication for CTs after discussion with neuro. ? ?I discussed case with Dr. Ashok Cordia, who accepts in transfer.  Patient would like to go by private vehicle.  Upon discussion with neurology, if for some reason the MRIs are all completely normal, they can touch base with neurology at [facility] to discuss any other lab work that would be helpful from an outpatient work-up perspective. ? ? ? ? ? ? ? ?Final Clinical Impression(s) / ED Diagnoses ?Final diagnoses:  ?None  ? ? ?Rx / DC Orders ?ED Discharge Orders   ? ? None  ? ?  ? ? ?  ?Sherwood Gambler, MD ?04/03/21 1754 ? ?

## 2021-04-03 NOTE — ED Notes (Signed)
ED Provider at bedside. 

## 2021-04-03 NOTE — ED Notes (Signed)
Patient transported to MRI 

## 2021-04-03 NOTE — ED Triage Notes (Signed)
Pt c/o urinary and stool incontinence x 3 days-also c/o lower back pain that radiates down both legs-NAD-steady gait ?

## 2021-04-03 NOTE — ED Provider Notes (Signed)
Patient transferred from [facility] for MRI of her spine.  She has history of fibromyalgia complaining of chronic back pain and loss of bowel or bladder function. ? ?MRI of the spine with and without contrast ordered and pending resolution. ? ?Patient otherwise has been ambulatory here in the ER. ?  ?Luna Fuse, MD ?04/03/21 2257 ? ?

## 2021-04-03 NOTE — ED Notes (Signed)
Report called to [facility] spoke to Training and development officer for MRI.  Pt to transfer by private vehicle, IV to remain in place per Dr. Regenia Skeeter.  Charge made aware of premed ordered. ?

## 2021-04-03 NOTE — ED Notes (Signed)
Pt had episode of urinary incontinence while sitting in chair awaiting paperwork for transfer. Provided paper scrubs and washed up prior to leaving for [facility] ED via private vehicle. ?

## 2021-04-03 NOTE — ED Notes (Signed)
Bladder scan post void for 26ml.  Dr Regenia Skeeter made aware. ?

## 2021-04-03 NOTE — Discharge Instructions (Addendum)
You were seen today for back pain.  Your MRI is not normal without any spinal cord impingement impingement or signal.  Follow-up with your primary physician. ?

## 2021-04-04 ENCOUNTER — Encounter: Payer: Self-pay | Admitting: Cardiology

## 2021-04-04 ENCOUNTER — Ambulatory Visit (INDEPENDENT_AMBULATORY_CARE_PROVIDER_SITE_OTHER): Admitting: Cardiology

## 2021-04-04 VITALS — BP 118/64 | HR 77 | Ht 66.0 in | Wt 159.8 lb

## 2021-04-04 DIAGNOSIS — R55 Syncope and collapse: Secondary | ICD-10-CM

## 2021-04-04 MED ORDER — KETOROLAC TROMETHAMINE 30 MG/ML IJ SOLN
30.0000 mg | Freq: Once | INTRAMUSCULAR | Status: AC
  Administered 2021-04-04: 30 mg via INTRAVENOUS
  Filled 2021-04-04: qty 1

## 2021-04-04 MED ORDER — NAPROXEN 500 MG PO TABS: 500.0000 mg | ORAL_TABLET | Freq: Two times a day (BID) | ORAL | 0 refills | Status: DC

## 2021-04-04 NOTE — Patient Instructions (Signed)
  Testing/Procedures:  Your physician has requested that you have an echocardiogram. Echocardiography is a painless test that uses sound waves to create images of your heart. It provides your doctor with information about the size and shape of your heart and how well your heart's chambers and valves are working. This procedure takes approximately one hour. There are no restrictions for this procedure. [address]   Follow-Up: At [facility], you and your health needs are our priority.  As part of our continuing mission to provide you with exceptional heart care, we have created designated Provider Care Teams.  These Care Teams include your primary Cardiologist (physician) and Advanced Practice Providers (APPs -  Physician Assistants and Nurse Practitioners) who all work together to provide you with the care you need, when you need it.  We recommend signing up for the patient portal called "MyChart".  Sign up information is provided on this After Visit Summary.  MyChart is used to connect with patients for Virtual Visits (Telemedicine).  Patients are able to view lab/test results, encounter notes, upcoming appointments, etc.  Non-urgent messages can be sent to your provider as well.   To learn more about what you can do with MyChart, go to [email or web address].    Your next appointment:    AS NEEDED 

## 2021-04-04 NOTE — ED Provider Notes (Signed)
MRI spine reviewed.  No mass lesions or abnormal signal.  She has some degenerative changes.  Likely not contributing to her current pain.  Recommend naproxen twice daily and follow-up with primary physician. ? ?Patient has been ambulatory independently per nursing. ? ?After history, exam, and medical workup I feel the patient has been appropriately medically screened and is safe for discharge home. Pertinent diagnoses were discussed with the patient. Patient was given return precautions. ? ?  ?Merryl Hacker, MD ?04/04/21 [phone] ? ?

## 2021-04-04 NOTE — ED Notes (Signed)
Pt remains in MRI 

## 2021-04-18 ENCOUNTER — Ambulatory Visit (HOSPITAL_COMMUNITY): Attending: Cardiology

## 2021-04-18 DIAGNOSIS — R55 Syncope and collapse: Secondary | ICD-10-CM | POA: Diagnosis present

## 2021-04-18 LAB — ECHOCARDIOGRAM COMPLETE
Area-P 1/2: 4.06 cm2
S' Lateral: 3.6 cm

## 2021-04-21 ENCOUNTER — Other Ambulatory Visit (HOSPITAL_COMMUNITY)

## 2021-05-05 ENCOUNTER — Emergency Department (HOSPITAL_BASED_OUTPATIENT_CLINIC_OR_DEPARTMENT_OTHER)
Admission: EM | Admit: 2021-05-05 | Discharge: 2021-05-05 | Disposition: A | Discharge status: Home / Self Care | Attending: Emergency Medicine | Admitting: Emergency Medicine

## 2021-05-05 DIAGNOSIS — M791 Myalgia, unspecified site: Secondary | ICD-10-CM | POA: Diagnosis not present

## 2021-05-05 DIAGNOSIS — R112 Nausea with vomiting, unspecified: Secondary | ICD-10-CM | POA: Insufficient documentation

## 2021-05-05 DIAGNOSIS — R1030 Lower abdominal pain, unspecified: Secondary | ICD-10-CM | POA: Diagnosis present

## 2021-05-05 DIAGNOSIS — R252 Cramp and spasm: Secondary | ICD-10-CM | POA: Insufficient documentation

## 2021-05-05 DIAGNOSIS — R197 Diarrhea, unspecified: Secondary | ICD-10-CM | POA: Diagnosis not present

## 2021-05-05 DIAGNOSIS — R1084 Generalized abdominal pain: Secondary | ICD-10-CM | POA: Insufficient documentation

## 2021-05-05 LAB — URINALYSIS, ROUTINE W REFLEX MICROSCOPIC
Bilirubin Urine: NEGATIVE
Glucose, UA: NEGATIVE mg/dL
Hgb urine dipstick: NEGATIVE
Ketones, ur: 15 mg/dL — AB
Leukocytes,Ua: NEGATIVE
Nitrite: NEGATIVE
Protein, ur: 30 mg/dL — AB
Specific Gravity, Urine: 1.02 (ref 1.005–1.030)
pH: 7.5 (ref 5.0–8.0)

## 2021-05-05 LAB — COMPREHENSIVE METABOLIC PANEL
ALT: 28 U/L (ref 0–44)
AST: 27 U/L (ref 15–41)
Albumin: 4.8 g/dL (ref 3.5–5.0)
Alkaline Phosphatase: 55 U/L (ref 38–126)
Anion gap: 10 (ref 5–15)
BUN: 14 mg/dL (ref 6–20)
CO2: 26 mmol/L (ref 22–32)
Calcium: 9.8 mg/dL (ref 8.9–10.3)
Chloride: 98 mmol/L (ref 98–111)
Creatinine, Ser: 0.68 mg/dL (ref 0.44–1.00)
GFR, Estimated: >60 mL/min (ref >60)
Glucose, Bld: 99 mg/dL (ref 70–99)
Potassium: 3.8 mmol/L (ref 3.5–5.1)
Sodium: 134 mmol/L — ABNORMAL LOW (ref 135–145)
Total Bilirubin: 1 mg/dL (ref 0.3–1.2)
Total Protein: 8.5 g/dL — ABNORMAL HIGH (ref 6.5–8.1)

## 2021-05-05 LAB — CBC
HCT: 42 % (ref 36.0–46.0)
Hemoglobin: 14.9 g/dL (ref 12.0–15.0)
MCH: 31.3 pg (ref 26.0–34.0)
MCHC: 35.5 g/dL (ref 30.0–36.0)
MCV: 88.2 fL (ref 80.0–100.0)
Platelets: 301 10*3/uL (ref 150–400)
RBC: 4.76 MIL/uL (ref 3.87–5.11)
RDW: 12.5 % (ref 11.5–15.5)
WBC: 9.6 10*3/uL (ref 4.0–10.5)
nRBC: 0 % (ref 0.0–0.2)

## 2021-05-05 LAB — PREGNANCY, URINE: Preg Test, Ur: NEGATIVE

## 2021-05-05 LAB — URINALYSIS, MICROSCOPIC (REFLEX)

## 2021-05-05 LAB — LIPASE, BLOOD: Lipase: 22 U/L (ref 11–51)

## 2021-05-05 MED ORDER — METHOCARBAMOL 500 MG PO TABS: 500.0000 mg | ORAL_TABLET | Freq: Two times a day (BID) | ORAL | 0 refills | Status: DC

## 2021-05-05 MED ORDER — METOCLOPRAMIDE HCL 10 MG PO TABS: 10.0000 mg | ORAL_TABLET | Freq: Four times a day (QID) | ORAL | 0 refills | Status: DC

## 2021-05-05 MED ORDER — METOCLOPRAMIDE HCL 5 MG/ML IJ SOLN
10.0000 mg | Freq: Once | INTRAMUSCULAR | Status: AC
  Administered 2021-05-05: 10 mg via INTRAVENOUS
  Filled 2021-05-05: qty 2

## 2021-05-05 MED ORDER — ONDANSETRON HCL 4 MG/2ML IJ SOLN
4.0000 mg | Freq: Once | INTRAMUSCULAR | Status: AC
  Administered 2021-05-05: 4 mg via INTRAVENOUS
  Filled 2021-05-05: qty 2

## 2021-05-05 MED ORDER — FENTANYL CITRATE PF 50 MCG/ML IJ SOSY
50.0000 ug | PREFILLED_SYRINGE | Freq: Once | INTRAMUSCULAR | Status: AC
  Administered 2021-05-05: 50 ug via INTRAVENOUS
  Filled 2021-05-05: qty 1

## 2021-05-05 MED ORDER — SODIUM CHLORIDE 0.9 % IV BOLUS
1000.0000 mL | Freq: Once | INTRAVENOUS | Status: AC
  Administered 2021-05-05: 1000 mL via INTRAVENOUS

## 2021-05-05 NOTE — ED Notes (Signed)
Pt reports she is feeling much better and thanking staff for her care. Pt A&Ox4 ambulatory at d/c with independent steady gait. Pt verbalized understanding of d/c instructions, prescriptions and follow up care. ?

## 2021-05-05 NOTE — ED Triage Notes (Signed)
Patient complains of generalized abdominal pain with vomiting x today. Was told by her provider to come in for fluids.  ?

## 2021-05-05 NOTE — ED Provider Notes (Signed)
?[facility] EMERGENCY DEPARTMENT ?Provider Note ? ? ?CSN: [id number] ?Arrival date & time: 05/05/21  1400 ? ?  ? ?History ? ?Chief Complaint  ?Patient presents with  ? Emesis  ? Abdominal Pain  ? ? ?Charlene Griffin is a 31 y.o. female with history of anxiety, PCOS, fibromyalgia and celiac disease who presents the emergency department complaining of abdominal pain, muscle cramping, and vomiting.  Patient states that she began having lower abdominal pain and back pain yesterday, and she was unsure if it was related to her fibromyalgia or celiac/cross contamination of her food.  Reports that she has been having intermittent episodes of diarrhea normal stools.  No blood. She states that starting about 2 AM this morning she said numerous episodes of vomiting.  She tried drinking some Pedialyte slowly, but threw all this up.  Patient is also complaining of some muscle cramping, especially in her hands and her calves.  She was feeling well leading up until yesterday. No fever, urinary symptoms, vaginal discharge or bleeding.  ? ? ?Emesis ?Associated symptoms: abdominal pain, diarrhea and myalgias   ?Associated symptoms: no chills, no cough and no fever   ?Abdominal Pain ?Associated symptoms: diarrhea, nausea and vomiting   ?Associated symptoms: no chest pain, no chills, no constipation, no cough, no dysuria, no fever, no hematuria and no shortness of breath   ? ?  ? ?Home Medications ?Prior to Admission medications   ?Medication Sig Start Date End Date Taking? Authorizing Provider  ?methocarbamol (ROBAXIN) 500 MG tablet Take 1 tablet (500 mg total) by mouth 2 (two) times daily. 05/05/21  Yes Particia Strahm T, PA-C  ?metoCLOPramide (REGLAN) 10 MG tablet Take 1 tablet (10 mg total) by mouth every 6 (six) hours. 05/05/21  Yes Cyril Woodmansee T, PA-C  ?albuterol (PROVENTIL) (2.5 MG/3ML) 0.083% nebulizer solution Inhale into the lungs as needed. 01/19/20   [provider]  ?dicyclomine (BENTYL) 20 MG  tablet Take 20 mg by mouth every 6 (six) hours.    [provider]  ?DULoxetine (CYMBALTA) 60 MG capsule Take 60 mg by mouth daily.    [provider]  ?montelukast (SINGULAIR) 10 MG tablet Take 1 tablet (10 mg total) by mouth at bedtime. 11/08/20   Lynden Oxford Scales, PA-C  ?naproxen (NAPROSYN) 500 MG tablet Take 1 tablet (500 mg total) by mouth 2 (two) times daily. 04/04/21   Horton, Barbette Hair, MD  ?   ? ?Allergies    ?Botox [botulinum toxin type a] and Gluten meal   ? ?Review of Systems   ?Review of Systems  ?Constitutional:  Negative for chills and fever.  ?Respiratory:  Negative for cough and shortness of breath.   ?Cardiovascular:  Negative for chest pain.  ?Gastrointestinal:  Positive for abdominal pain, diarrhea, nausea and vomiting. Negative for blood in stool and constipation.  ?Genitourinary:  Negative for dysuria, flank pain, hematuria and urgency.  ?Musculoskeletal:  Positive for myalgias.  ?All other systems reviewed and are negative. ? ?Physical Exam ?Updated Vital Signs ?BP 122/71 (BP Location: Left Arm)   Pulse 86   Temp 99.8 ?F (37.7 ?C) (Oral)   Resp 17   Ht 5\' 6"  (1.676 m)   Wt 72.6 kg   LMP 04/06/2021   SpO2 100%   BMI 25.82 kg/m?  ?Physical Exam ?Vitals and nursing note reviewed.  ?Constitutional:   ?   Appearance: Normal appearance.  ?HENT:  ?   Head: Normocephalic and atraumatic.  ?Eyes:  ?   Conjunctiva/sclera: Conjunctivae  normal.  ?Cardiovascular:  ?   Rate and Rhythm: Normal rate and regular rhythm.  ?Pulmonary:  ?   Effort: Pulmonary effort is normal. No respiratory distress.  ?   Breath sounds: Normal breath sounds.  ?Abdominal:  ?   General: There is no distension.  ?   Palpations: Abdomen is soft.  ?   Tenderness: There is generalized abdominal tenderness. There is no right CVA tenderness, left CVA tenderness, guarding or rebound.  ?Musculoskeletal:  ?   Comments: Muscle contractures noted to the left thumb and the right calf.  ?Skin: ?   General: Skin is  warm and dry.  ?Neurological:  ?   General: No focal deficit present.  ?   Mental Status: She is alert.  ? ? ?ED Results / Procedures / Treatments   ?Labs ?(all labs ordered are listed, but only abnormal results are displayed) ?Labs Reviewed  ?COMPREHENSIVE METABOLIC PANEL - Abnormal; Notable for the following components:  ?    Result Value  ? Sodium 134 (*)   ? Total Protein 8.5 (*)   ? All other components within normal limits  ?URINALYSIS, ROUTINE W REFLEX MICROSCOPIC - Abnormal; Notable for the following components:  ? APPearance HAZY (*)   ? Ketones, ur 15 (*)   ? Protein, ur 30 (*)   ? All other components within normal limits  ?URINALYSIS, MICROSCOPIC (REFLEX) - Abnormal; Notable for the following components:  ? Bacteria, UA RARE (*)   ? All other components within normal limits  ?LIPASE, BLOOD  ?CBC  ?PREGNANCY, URINE  ? ? ?EKG ?None ? ?Radiology ?No results found. ? ?Procedures ?Procedures  ? ? ?Medications Ordered in ED ?Medications  ?ondansetron (ZOFRAN) injection 4 mg (4 mg Intravenous Given 05/05/21 1429)  ?sodium chloride 0.9 % bolus 1,000 mL (0 mLs Intravenous Stopped 05/05/21 1716)  ?metoCLOPramide (REGLAN) injection 10 mg (10 mg Intravenous Given 05/05/21 1542)  ?fentaNYL (SUBLIMAZE) injection 50 mcg (50 mcg Intravenous Given 05/05/21 1542)  ? ? ?ED Course/ Medical Decision Making/ A&P ?  ?                        ?Medical Decision Making ?Amount and/or Complexity of Data Reviewed ?Labs: ordered. ? ?Risk ?Prescription drug management. ? ? ?This patient presents to the ED for concern of abdominal pain and vomiting, this involves an extensive number of treatment options, and is a complaint that carries with it a high risk of complications and morbidity. The emergent differential diagnosis prior to evaluation includes, but is not limited to,  AAA, mesenteric ischemia, appendicitis, diverticulitis, DKA, gastritis/gastroenteritis, nephrolithiasis, pancreatitis, peritonitis, constipation, UTI, SBO/LBO, biliary  disease, IBD/IBS, PUD, hepatitis, ectopic pregnancy, ovarian torsion, PID. This is not an exhaustive differential.  ? ?Past Medical History / Co-morbidities / Social History: ?anxiety, PCOS, fibromyalgia and celiac disease ? ?Additional history: ?Chart reviewed. Pertinent results include: Patient most recently seen by PCP several days ago for chronic unspecified pelvic pain and low back pain, consistent with her previous presentations of fibromyalgia. ? ?Physical Exam: ?Physical exam performed. The pertinent findings include: Patient is afebrile, not tachycardic, no acute distress.  Abdomen is soft, with generalized tenderness.  No guarding.  Some muscle contractures noted to the left thumb as well as the right calf consistent with cramping. ? ?Lab Tests: ?I ordered, and personally interpreted labs.  The pertinent results include: ?Cytosis, normal hemoglobin.  Electrolytes grossly within normal limits.  Normal lipase.  Urinalysis negative for hematuria or infection.  Negative pregnancy. ?  ?Medications: ?I ordered medication including IV fluids, antiemetics, analgesics for abdominal pain and vomiting. Reevaluation of the patient after these medicines showed that the patient improved. I have reviewed the patients home medicines and have made adjustments as needed. ? ?Disposition: ?After consideration of the diagnostic results and the patients response to treatment, I feel that patient's not requiring admission or inpatient treatment for symptoms.  I am reassured by the fact that patient's symptoms most completely resolved after the medications given.  Repeat abdominal exam remains nonsurgical.  Anticipate symptoms could have been related to viral gastroenteritis, or cross-contamination patient celiac disease.  Will discharge home with prescription for antiemetic.  Patient states that she has tried muscle relaxers in the past for her chronic cramping, and like to try a different one today.  We will send a prescription  for this.  We discussed reasons to return to the emergency department, patient's agreeable to the plan. ? ?Final Clinical Impression(s) / ED Diagnoses ?Final diagnoses:  ?Generalized abdominal pain  ?Nausea and

## 2021-05-05 NOTE — Discharge Instructions (Addendum)
You were seen in the emergency department for abdominal pain. ? ?As we discussed, your electrolyte levels have looked pretty good today. We gave you IV fluids, nausea medication, and pain medication and I'm reassured you're feeling much better! ? ?You can take the nausea medicine Reglan every 6 hours as needed. You can take the muscle relaxer up to twice daily.  ? ?Continue to monitor how you're doing and return to the ER for new or worsening symptoms.  ?

## 2021-06-12 ENCOUNTER — Emergency Department (HOSPITAL_BASED_OUTPATIENT_CLINIC_OR_DEPARTMENT_OTHER)
Admission: EM | Admit: 2021-06-12 | Discharge: 2021-06-12 | Disposition: A | Discharge status: Home / Self Care | Attending: Emergency Medicine | Admitting: Emergency Medicine

## 2021-06-12 ENCOUNTER — Other Ambulatory Visit: Payer: Self-pay

## 2021-06-12 ENCOUNTER — Emergency Department (HOSPITAL_BASED_OUTPATIENT_CLINIC_OR_DEPARTMENT_OTHER)

## 2021-06-12 ENCOUNTER — Encounter (HOSPITAL_BASED_OUTPATIENT_CLINIC_OR_DEPARTMENT_OTHER): Payer: Self-pay | Admitting: Emergency Medicine

## 2021-06-12 DIAGNOSIS — H539 Unspecified visual disturbance: Secondary | ICD-10-CM

## 2021-06-12 DIAGNOSIS — G43101 Migraine with aura, not intractable, with status migrainosus: Secondary | ICD-10-CM | POA: Diagnosis not present

## 2021-06-12 DIAGNOSIS — R519 Headache, unspecified: Secondary | ICD-10-CM | POA: Diagnosis present

## 2021-06-12 LAB — CBC
HCT: 41 % (ref 36.0–46.0)
Hemoglobin: 14.3 g/dL (ref 12.0–15.0)
MCH: 31.4 pg (ref 26.0–34.0)
MCHC: 34.9 g/dL (ref 30.0–36.0)
MCV: 89.9 fL (ref 80.0–100.0)
Platelets: 348 10*3/uL (ref 150–400)
RBC: 4.56 MIL/uL (ref 3.87–5.11)
RDW: 13.2 % (ref 11.5–15.5)
WBC: 8 10*3/uL (ref 4.0–10.5)
nRBC: 0 % (ref 0.0–0.2)

## 2021-06-12 LAB — URINALYSIS, ROUTINE W REFLEX MICROSCOPIC
Bilirubin Urine: NEGATIVE
Glucose, UA: NEGATIVE mg/dL
Hgb urine dipstick: NEGATIVE
Ketones, ur: NEGATIVE mg/dL
Leukocytes,Ua: NEGATIVE
Nitrite: NEGATIVE
Protein, ur: NEGATIVE mg/dL
Specific Gravity, Urine: 1.025 (ref 1.005–1.030)
pH: 6 (ref 5.0–8.0)

## 2021-06-12 LAB — BASIC METABOLIC PANEL
Anion gap: 8 (ref 5–15)
BUN: 15 mg/dL (ref 6–20)
CO2: 28 mmol/L (ref 22–32)
Calcium: 9.3 mg/dL (ref 8.9–10.3)
Chloride: 101 mmol/L (ref 98–111)
Creatinine, Ser: 0.76 mg/dL (ref 0.44–1.00)
GFR, Estimated: >60 mL/min (ref >60)
Glucose, Bld: 94 mg/dL (ref 70–99)
Potassium: 3.7 mmol/L (ref 3.5–5.1)
Sodium: 137 mmol/L (ref 135–145)

## 2021-06-12 LAB — PREGNANCY, URINE: Preg Test, Ur: NEGATIVE

## 2021-06-12 MED ORDER — MAGNESIUM SULFATE 50 % IJ SOLN
2.0000 g | Freq: Once | INTRAMUSCULAR | Status: AC
  Administered 2021-06-12: 2 g via INTRAVENOUS
  Filled 2021-06-12: qty 4

## 2021-06-12 MED ORDER — DEXAMETHASONE SODIUM PHOSPHATE 10 MG/ML IJ SOLN
10.0000 mg | Freq: Once | INTRAMUSCULAR | Status: AC
  Administered 2021-06-12: 10 mg via INTRAVENOUS
  Filled 2021-06-12: qty 1

## 2021-06-12 MED ORDER — ACETAMINOPHEN 325 MG PO TABS
ORAL_TABLET | ORAL | Status: AC
  Filled 2021-06-12: qty 1

## 2021-06-12 MED ORDER — SODIUM CHLORIDE 0.9 % IV BOLUS
1000.0000 mL | Freq: Once | INTRAVENOUS | Status: AC
  Administered 2021-06-12: 1000 mL via INTRAVENOUS

## 2021-06-12 MED ORDER — PROCHLORPERAZINE EDISYLATE 10 MG/2ML IJ SOLN
10.0000 mg | Freq: Once | INTRAMUSCULAR | Status: AC
  Administered 2021-06-12: 10 mg via INTRAVENOUS
  Filled 2021-06-12: qty 2

## 2021-06-12 MED ORDER — ACETAMINOPHEN 325 MG PO TABS
650.0000 mg | ORAL_TABLET | Freq: Once | ORAL | Status: AC
  Administered 2021-06-12: 650 mg via ORAL
  Filled 2021-06-12: qty 2

## 2021-06-12 NOTE — ED Triage Notes (Signed)
Patient reports migraine since 06/01/21. Went to urgent care on 06/02/21 and was negative for COVID. States on 06/06/21 she went back to urgent care and she had pink eye in both eyes, since then reports blurry vision, and migraine has not resolved.

## 2021-06-12 NOTE — ED Provider Notes (Signed)
[facility] Provider Note   CSN: [id number] Arrival date & time: 06/12/21  [id number]     History  Chief Complaint  Patient presents with   Headache    Charlene Griffin is a 31 y.o. female with past medical history significant for recurrent migraines who presents with constellation of symptoms including headache that has been ongoing for 10 days, bilateral eye blurry vision with distance, as well as some feelings of dizziness, weakness.  Patient reports a history of migraines in the past, takes Nurtec, has had Nurtec 3 out of the 10 days for her migraine with no relief.  She received Toradol at urgent care prior to arrival with no relief of her symptoms.  Patient denies any diplopia, feeling like a curtain has fallen over her vision, unilateral vision changes or eye pain.  She does endorse some photophobia which is normal for her with migraine.   Headache     Home Medications Prior to Admission medications   Medication Sig Start Date End Date Taking? Authorizing Provider  albuterol (PROVENTIL) (2.5 MG/3ML) 0.083% nebulizer solution Inhale into the lungs as needed. 01/19/20   [provider]  dicyclomine (BENTYL) 20 MG tablet Take 20 mg by mouth every 6 (six) hours.    [provider]  DULoxetine (CYMBALTA) 60 MG capsule Take 60 mg by mouth daily.    [provider]  methocarbamol (ROBAXIN) 500 MG tablet Take 1 tablet (500 mg total) by mouth 2 (two) times daily. 05/05/21   Roemhildt, Lorin T, PA-C  metoCLOPramide (REGLAN) 10 MG tablet Take 1 tablet (10 mg total) by mouth every 6 (six) hours. 05/05/21   Roemhildt, Lorin T, PA-C  montelukast (SINGULAIR) 10 MG tablet Take 1 tablet (10 mg total) by mouth at bedtime. 11/08/20   Lynden Oxford Scales, PA-C  naproxen (NAPROSYN) 500 MG tablet Take 1 tablet (500 mg total) by mouth 2 (two) times daily. 04/04/21   Horton, Barbette Hair, MD      Allergies    Botox [botulinum toxin type a],  Benadryl [diphenhydramine], and Gluten meal    Review of Systems   Review of Systems  Neurological:  Positive for headaches.  All other systems reviewed and are negative.  Physical Exam Updated Vital Signs BP 109/62   Pulse 93   Temp 97.8 F (36.6 C) (Oral)   Resp 16   Ht 5\' 6"  (1.676 m)   Wt 70.3 kg   LMP 05/24/2021   SpO2 97%   BMI 25.02 kg/m  Physical Exam Vitals and nursing note reviewed.  Constitutional:      General: She is not in acute distress.    Appearance: Normal appearance.  HENT:     Head: Normocephalic and atraumatic.  Eyes:     General:        Right eye: No discharge.        Left eye: No discharge.  Cardiovascular:     Rate and Rhythm: Normal rate and regular rhythm.     Heart sounds: No murmur heard.   No friction rub. No gallop.  Pulmonary:     Effort: Pulmonary effort is normal.     Breath sounds: Normal breath sounds.  Abdominal:     General: Bowel sounds are normal.     Palpations: Abdomen is soft.  Skin:    General: Skin is warm and dry.     Capillary Refill: Capillary refill takes less than 2 seconds.  Neurological:     Mental Status:  She is alert and oriented to person, place, and time.     Comments: Cranial nerves II through XII grossly intact.  Intact finger-nose, intact heel-to-shin.  Romberg negative, gait normal.  Alert and oriented x3.  Moves all 4 limbs spontaneously, normal coordination.  No pronator drift.  Intact strength 5 out of 5 bilateral upper and lower extremities.  Patient endorsed some hesitancy on Romberg but did not have any step out.  She endorsed some feelings of weakness or difficulty with coordination on the right compared to the left, but I do not note any abnormalities on objective exam findings  Psychiatric:        Mood and Affect: Mood normal.        Behavior: Behavior normal.    ED Results / Procedures / Treatments   Labs (all labs ordered are listed, but only abnormal results are displayed) Labs Reviewed   URINALYSIS, ROUTINE W REFLEX MICROSCOPIC - Abnormal; Notable for the following components:      Result Value   APPearance HAZY (*)    All other components within normal limits  CBC  BASIC METABOLIC PANEL  PREGNANCY, URINE    EKG None  Radiology CT Head Wo Contrast  Result Date: 06/12/2021 CLINICAL DATA:  Headache, new or worsening symptoms. History of migraine headache. EXAM: CT HEAD WITHOUT CONTRAST TECHNIQUE: Contiguous axial images were obtained from the base of the skull through the vertex without intravenous contrast. RADIATION DOSE REDUCTION: This exam was performed according to the departmental dose-optimization program which includes automated exposure control, adjustment of the mA and/or kV according to patient size and/or use of iterative reconstruction technique. COMPARISON:  CT examination dated October 02, 2019 FINDINGS: Brain: No evidence of acute infarction, hemorrhage, hydrocephalus, extra-axial collection or mass lesion/mass effect. Vascular: No hyperdense vessel or unexpected calcification. Skull: Normal. Negative for fracture or focal lesion. Sinuses/Orbits: No acute finding. Other: None. IMPRESSION: No acute intracranial abnormality. Electronically Signed   By: Keane Police D.O.   On: 06/12/2021 22:19    Procedures Procedures    Medications Ordered in ED Medications  sodium chloride 0.9 % bolus 1,000 mL (0 mLs Intravenous Stopped 06/12/21 2305)  prochlorperazine (COMPAZINE) injection 10 mg (10 mg Intravenous Given 06/12/21 2130)  acetaminophen (TYLENOL) tablet 650 mg (650 mg Oral Given 06/12/21 2131)  magnesium sulfate (IV Push/IM) injection 2 g (2 g Intravenous Given 06/12/21 2130)  dexamethasone (DECADRON) injection 10 mg (10 mg Intravenous Given 06/12/21 2130)    ED Course/ Medical Decision Making/ A&P                           Medical Decision Making Amount and/or Complexity of Data Reviewed Labs: ordered. Radiology: ordered.  Risk OTC drugs. Prescription drug  management.   This patient is a 31 y.o. female who presents to the ED for concern of persistent migraine for 10 days, as well as vision changes that she describes as blurry vision as a distance that is equal bilaterally, this involves an extensive number of treatment options, and is a complaint that carries with it a high risk of complications and morbidity. The emergent differential diagnosis prior to evaluation includes, but is not limited to, migraine, status migrainosus, dural venous thrombosis, other acute intracranial abnormality, considered vision changes as part of new presentation of multiple sclerosis, retinal vein or artery occlusion, acute angle-closure glaucoma versus other.   This is not an exhaustive differential.   Past Medical History / Co-morbidities /  Social History: Patient with history of severe migraines, she reports that she takes Nurtec, she does also have a neurologist, history of fibromyalgia, no other significant health abnormalities  Additional history: Chart reviewed. Pertinent results include: Reviewed lab work and imaging from previous emergency department visits as well as urgent care visit just prior to arrival with recommendation to present to the emergency department  Physical Exam: Physical exam performed. The pertinent findings include: Patient with no objective neurologic deficits other than her equal bilaterally blurry vision at a distance.  Her pupils are equal round reactive to light, she shows no signs of ongoing bacterial conjunctivitis, and she is not having any eye pain.  She had endorsed some dizziness, weakness but her neurologic exam was reassuring.  Lab Tests: I ordered, and personally interpreted labs.  The pertinent results include: Patient not pregnant, unremarkable BMP, urinalysis, CBC   Imaging Studies: I ordered imaging studies including CT head without contrast. I independently visualized and interpreted imaging which showed no evidence of  intrathoracic abnormality. I agree with the radiologist interpretation.   Medications: I ordered medication including migraine cocktail for headache. Reevaluation of the patient after these medicines showed that the patient resolved. I have reviewed the patients home medicines and have made adjustments as needed.   Disposition: After consideration of the diagnostic results and the patients response to treatment, I feel that patient with complete resolution of her migraine on reevaluation.  She continues to have no worrisome neurologic deficits.  Her vision changes are difficult to assess, however in the context of her overall clinical condition I have a high suspicion that she may be developing nearsightedness.  I have low clinical suspicion for new presentation of multiple sclerosis.  Encouraged her to follow-up with optometry/ophthalmology for formal eye evaluation.   I discussed this case with my attending physician Dr. Laverta Baltimore who cosigned this note including patient's presenting symptoms, physical exam, and planned diagnostics and interventions. Attending physician stated agreement with plan or made changes to plan which were implemented.    Final Clinical Impression(s) / ED Diagnoses Final diagnoses:  Migraine with aura and with status migrainosus, not intractable  Vision changes    Rx / DC Orders ED Discharge Orders     None         Dorien Chihuahua 06/12/21 2323    Margette Fast, MD 06/13/21 1327

## 2021-06-12 NOTE — Discharge Instructions (Signed)
Discussed your lab work and imaging today was reassuring, I do not see any intracranial cause for her migraine.  I am reassured that your symptoms resolved with the medicines that we have provided.  Based on your description of your vision changes I think that you may be nearsighted despite not having a history of any vision problems in the past.  I recommend that you follow-up with an ophthalmologist or optometrist to get your vision assessed at your earliest convenience.  If you have significant pain with vision, you seem like you have 1 side become significantly worse than the other, you develop persistent double vision or you have return of your migraine I recommend that you return to the emergency department for further evaluation.

## 2021-07-03 ENCOUNTER — Encounter: Payer: Self-pay | Admitting: Neurology

## 2021-07-03 ENCOUNTER — Ambulatory Visit (INDEPENDENT_AMBULATORY_CARE_PROVIDER_SITE_OTHER): Admitting: Neurology

## 2021-07-03 VITALS — BP 108/73 | HR 54 | Ht 66.0 in | Wt 165.0 lb

## 2021-07-03 DIAGNOSIS — G43709 Chronic migraine without aura, not intractable, without status migrainosus: Secondary | ICD-10-CM

## 2021-07-03 DIAGNOSIS — M62838 Other muscle spasm: Secondary | ICD-10-CM | POA: Diagnosis not present

## 2021-07-03 MED ORDER — LAMOTRIGINE 100 MG PO TABS: 100.0000 mg | ORAL_TABLET | Freq: Every day | ORAL | 6 refills | Status: DC

## 2021-07-03 MED ORDER — GABAPENTIN 300 MG PO CAPS: 600.0000 mg | ORAL_CAPSULE | Freq: Three times a day (TID) | ORAL | 6 refills | Status: DC

## 2021-07-03 MED ORDER — NURTEC 75 MG PO TBDP: ORAL_TABLET | ORAL | 11 refills | Status: DC

## 2021-07-03 MED ORDER — LAMOTRIGINE 25 MG PO TABS: ORAL_TABLET | ORAL | 0 refills | Status: DC

## 2021-07-04 ENCOUNTER — Telehealth: Payer: Self-pay | Admitting: Neurology

## 2021-07-04 LAB — ANA W/REFLEX IF POSITIVE: Anti Nuclear Antibody (ANA): NEGATIVE

## 2021-07-04 LAB — CK: Total CK: 79 U/L (ref 32–182)

## 2021-07-04 LAB — CALCIUM, IONIZED: Calcium, Ion: 5.2 mg/dL (ref 4.5–5.6)

## 2021-07-04 LAB — THYROID PANEL WITH TSH
Free Thyroxine Index: 1.5 (ref 1.2–4.9)
T3 Uptake Ratio: 28 % (ref 24–39)
T4, Total: 5.4 ug/dL (ref 4.5–12.0)
TSH: 0.705 u[IU]/mL (ref 0.450–4.500)

## 2021-07-04 LAB — SEDIMENTATION RATE: Sed Rate: 2 mm/hr (ref 0–32)

## 2021-07-04 LAB — CALCIUM: Calcium: 9.6 mg/dL (ref 8.7–10.2)

## 2021-07-04 LAB — VITAMIN B12: Vitamin B-12: 385 pg/mL (ref 232–1245)

## 2021-07-04 LAB — C-REACTIVE PROTEIN: CRP: <1 mg/L (ref 0–10)

## 2021-07-04 LAB — RPR: RPR Ser Ql: NONREACTIVE

## 2021-07-04 LAB — MAGNESIUM: Magnesium: 2.5 mg/dL — ABNORMAL HIGH (ref 1.6–2.3)

## 2021-07-04 LAB — HIV ANTIBODY (ROUTINE TESTING W REFLEX): HIV Screen 4th Generation wRfx: NONREACTIVE

## 2021-07-04 LAB — VITAMIN D 25 HYDROXY (VIT D DEFICIENCY, FRACTURES): Vit D, 25-Hydroxy: 21.3 ng/mL — ABNORMAL LOW (ref 30.0–100.0)

## 2021-07-04 NOTE — Telephone Encounter (Signed)
Please call patient, following he is FDA package insert for pregnancy related complication for Nurtec  would suggest her not to take Nurtec if she is trying to get pregnant, Tylenol as needed for migraine    8.1 Pregnancy  Risk Summary  There are no adequate data on the developmental risk associated with the use of NURTEC ODT  in pregnant women. In animal studies, oral administration of rimegepant during organogenesis  resulted in adverse effects on development in rats (decreased fetal body weight and increased  incidence of fetal variations) at exposures greater than those used clinically and which were  associated with maternal toxicity. The evaluation of developmental effects following oral  administration of rimegepant throughout pregnancy and lactation was inadequate (see Data).  In the [place] general population, the estimated background risk of major birth defects and  miscarriage in clinically recognized pregnancies is 2 to 4% and 15 to 20%, respectively. The  estimated rate of major birth defects (2.2 to 2.9%) and miscarriage (17%) among deliveries to  women with migraine are similar to rates reported in women without migraine.  Reference ID: [id number]    Clinical Considerations  Disease-Associated Maternal and/or Embryo/Fetal Risk  Published data have suggested that women with migraine may be at increased risk of  preeclampsia and gestational hypertension during pregnancy.  Data  Animal Data  Oral administration of rimegepant (0, 10, 60, or 300 mg/kg/day) to pregnant rats during the  period of organogenesis resulted in decreased fetal body weight and an increased incidence of  fetal variations at the highest dose tested (300 mg/kg/day), which was associated with maternal  toxicity. Plasma exposures (AUC) at the no-effect dose (60 mg/kg/day) for adverse effects on  embryofetal development were approximately 45 times that in humans at the maximum  recommended human dose (MRHD) of 75  mg/day.  Oral administration of rimegepant (0, 10, 25, or 50 mg/kg/day) to pregnant rabbits during the  period of organogenesis resulted in no adverse effects on embryofetal development. The highest  dose tested (50 mg/kg/day) was associated with plasma exposures (AUC) approximately 10  times that in humans at the [facility].  The prenatal and postnatal development study in rats, in which rimegepant (0, 10, 25, or 60  mg/kg/day) was orally administered throughout gestation and lactation, was inadequate to assess  for adverse effects of rimegepant during these periods of development.

## 2021-07-04 NOTE — Telephone Encounter (Signed)
Results given to pt,  Pt is asking if it is safe to take Nurtec if she is trying to get pregnant?         Laboratory evaluation showed:   -Mildly decreased vitamin D level 21.3, you would benefit over-the-counter D3 supplement, 1000 units daily   -Slight elevated magnesium level, 2.5, if you are on magnesium supplement, you may stop taking it.   -Rest of the laboratory evaluation showed no significant abnormalities.    Levert Feinstein, M.D. Ph.D.

## 2021-09-11 IMAGING — CT CT ABD-PELV W/ CM
2 of 4 series · 16 of 46 positions shown, 18 images · IV contrast (OMNIPAQUE 300)
Comparison: None.

CLINICAL DATA: Left lower quadrant abdominal pain for months.
History of polycystic ovarian syndrome. History of a left
oophorectomy.

EXAM:
CT ABDOMEN AND PELVIS WITH CONTRAST
TECHNIQUE: Multidetector CT imaging of the abdomen and pelvis was performed
using the standard protocol following bolus administration of
intravenous contrast.
CONTRAST:  100mL OMNIPAQUE IOHEXOL 300 MG/ML  SOLN

[Series 2: abd/pel w · axial · 0.72mm/px · z∈[-506,-86]mm · 13 of 92 slices shown, 15 images]
[im 4/92  soft-tissue]
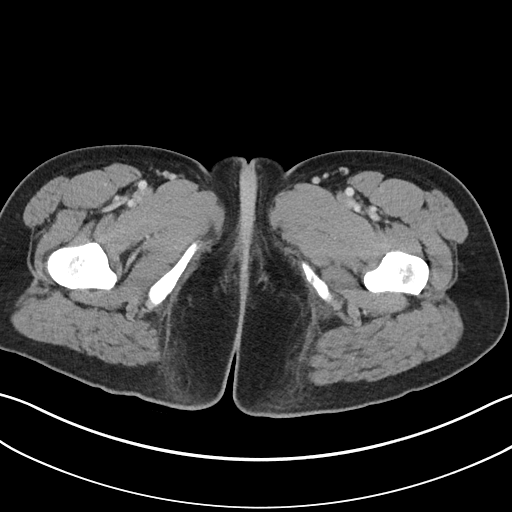
[im 4/92  bone]
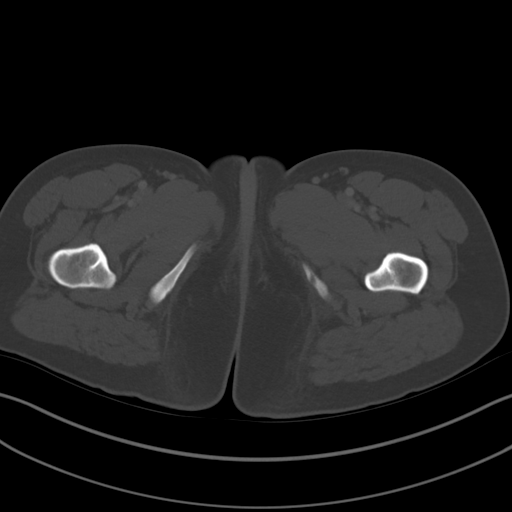
[im 12/92  soft-tissue]
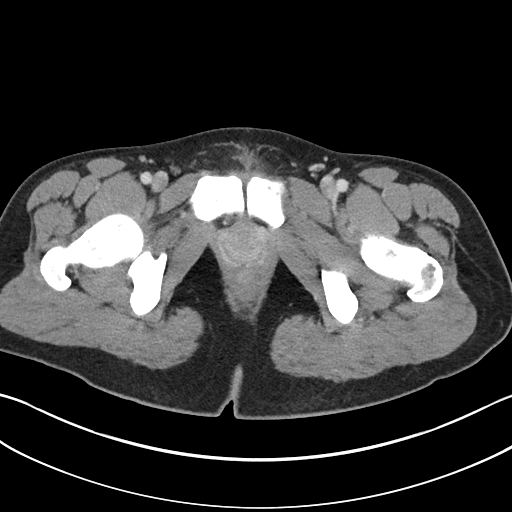
[im 19/92  soft-tissue]
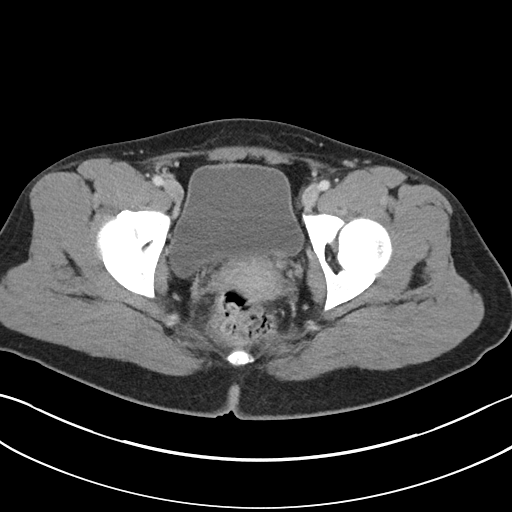
[im 27/92  soft-tissue]
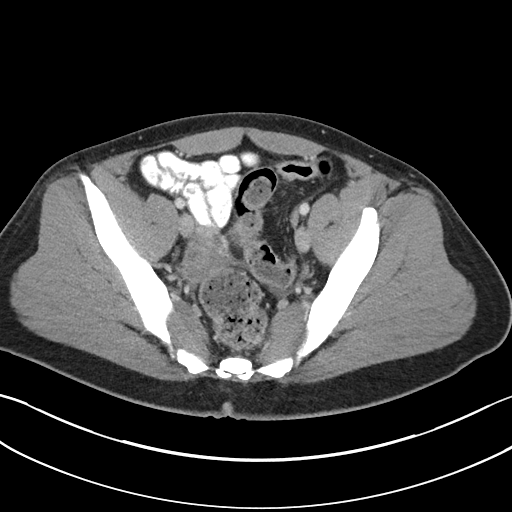
[im 31/92  soft-tissue]
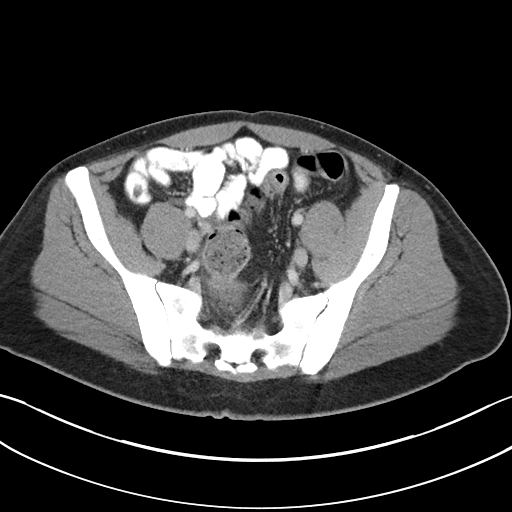
[im 38/92  soft-tissue]
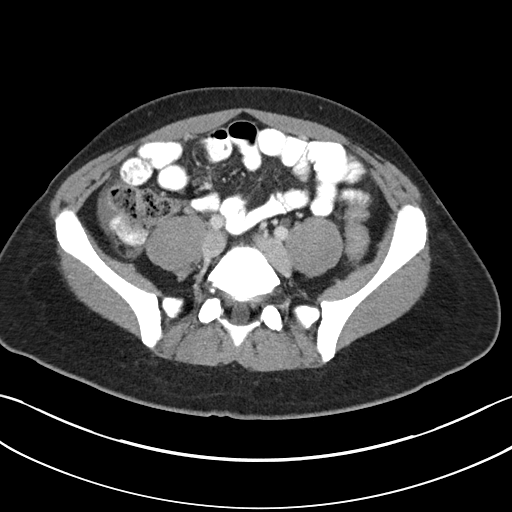
[im 46/92  soft-tissue]
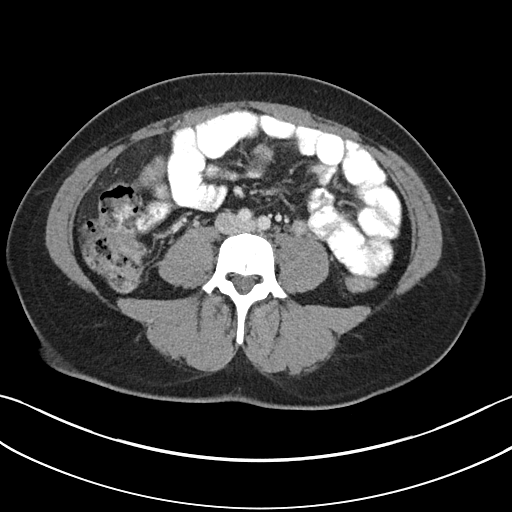
[im 54/92  soft-tissue]
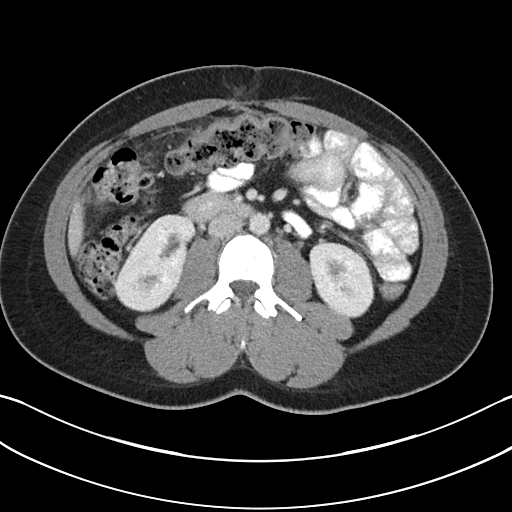
[im 61/92  soft-tissue]
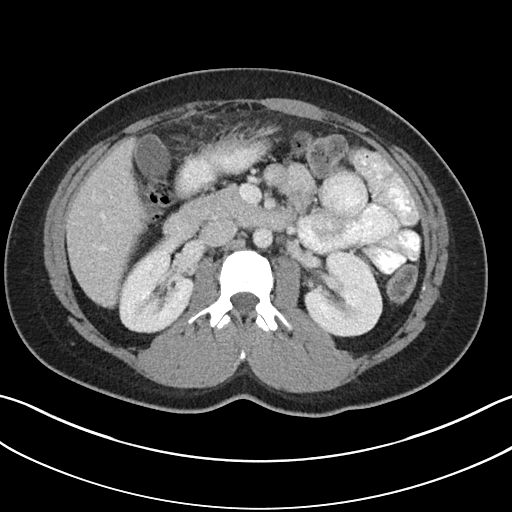
[im 61/92  bone]
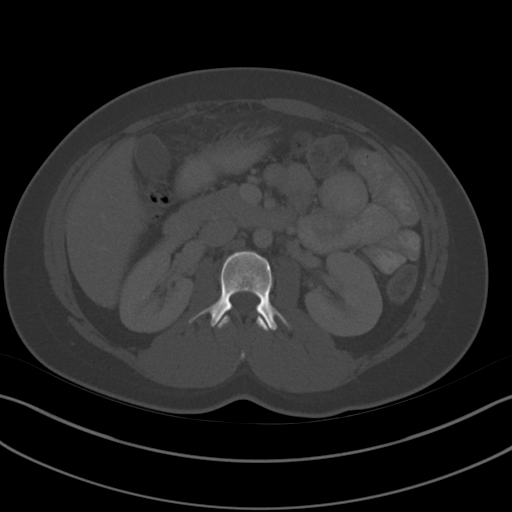
[im 65/92  soft-tissue]
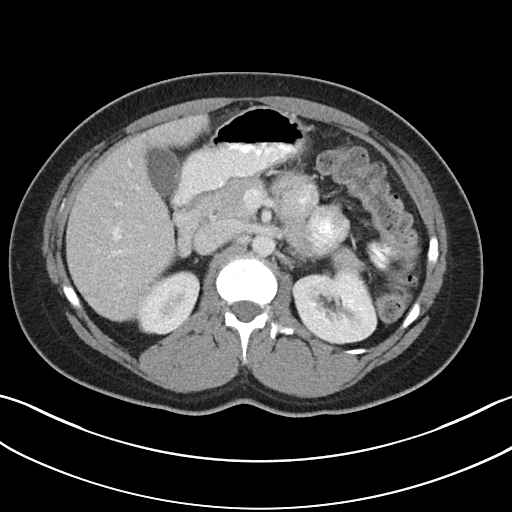
[im 73/92  soft-tissue]
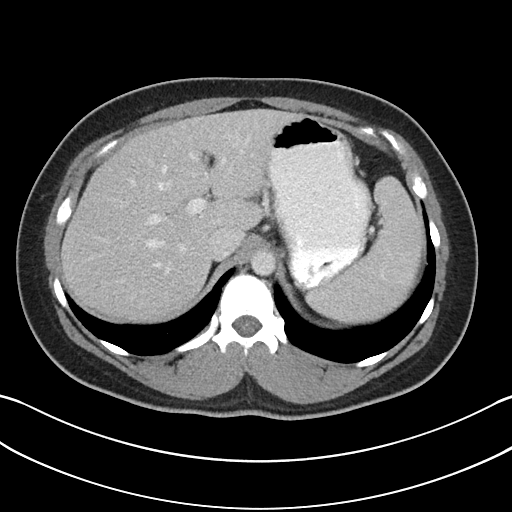
[im 80/92  soft-tissue]
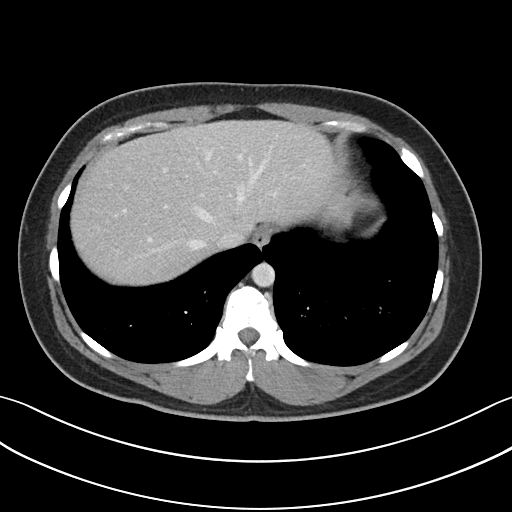
[im 88/92  soft-tissue]
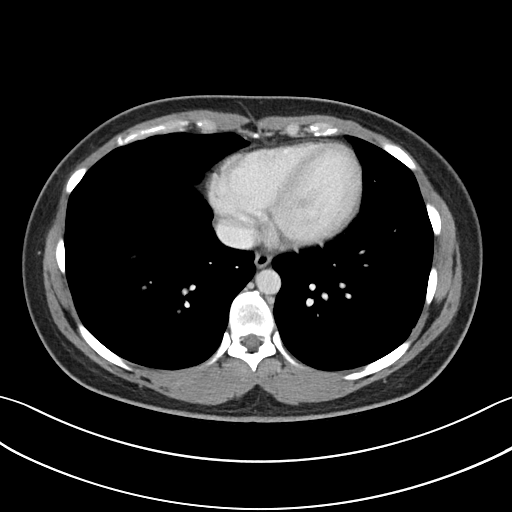

[Series 5: abd/pel w st · coronal · 0.74mm/px · 3 of 100 slices shown]
[im 34/100  soft-tissue]
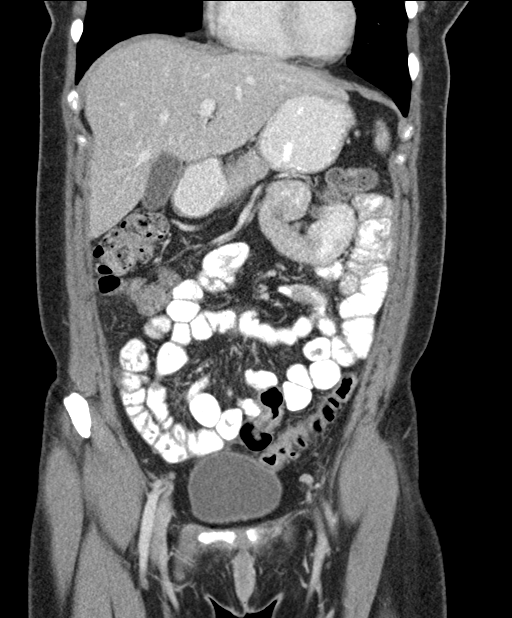
[im 45/100  soft-tissue]
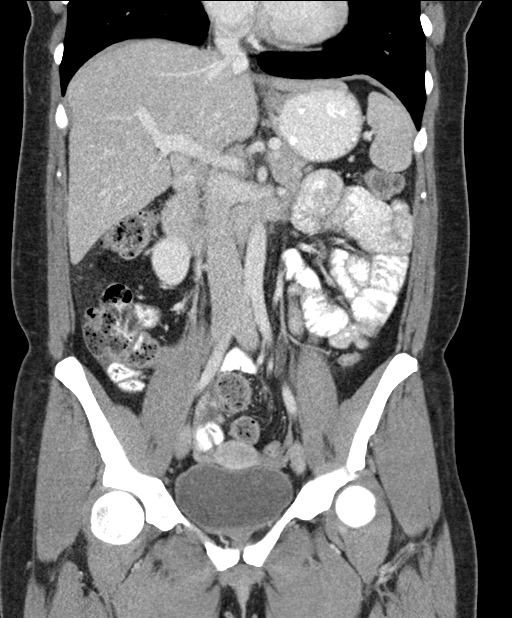
[im 56/100  soft-tissue]
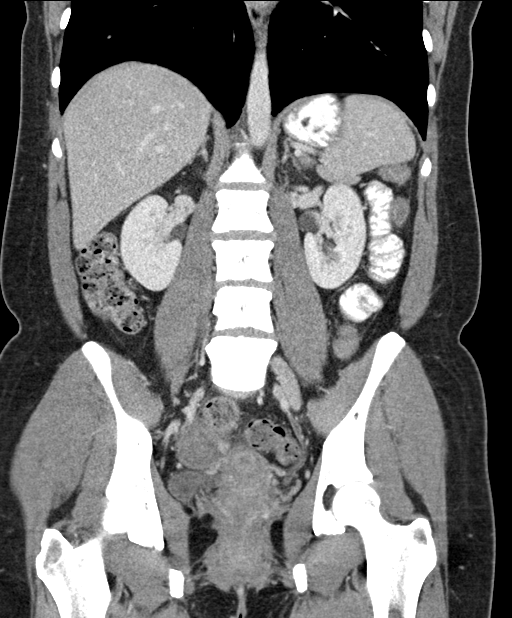

[16 of 46 positions shown; findings below may reference images not displayed]

FINDINGS: Lower chest: Clear lung bases.  Heart normal in size.

Hepatobiliary: 2 small low-density liver lesions, 1 cm lesion
central right lobe near the intrahepatic inferior vena cava, the
other superiorly left lobe, approximately 4 mm. These are both
likely cysts. Liver normal in size and overall attenuation. No other
masses or lesions. Normal gallbladder. No bile duct dilation.

Pancreas: Unremarkable. No pancreatic ductal dilatation or
surrounding inflammatory changes.

Spleen: Normal in size without focal abnormality.

Adrenals/Urinary Tract: Adrenal glands are unremarkable. Kidneys are
normal, without renal calculi, focal lesion, or hydronephrosis.
Bladder is unremarkable.

Stomach/Bowel: Mild increase in colonic stool burden. No colonic
wall thickening. No inflammation. Are few small scattered left colon
diverticula.

Normal stomach. Small bowel is normal in caliber. No wall thickening
or inflammation.

Appendix not visualized.  No evidence of appendicitis.

Vascular/Lymphatic: No significant vascular findings are present. No
enlarged abdominal or pelvic lymph nodes.

Reproductive: Normal uterus. Normal sized right ovary. Minimal
pelvic free fluid, physiologic. No adnexal masses.

Other: Small fat containing umbilical and supraumbilical hernias.

Musculoskeletal: No acute or significant osseous findings.
IMPRESSION: 1. No acute findings. No findings to account for left lower quadrant
pain.
2. Mild increased colonic stool burden. No bowel inflammation. Few
small scattered left colon diverticula.
3. Two small liver lesions consistent with cysts.
4. Small umbilical and supraumbilical fat containing hernias. No
other abnormalities.

## 2021-10-23 NOTE — Patient Instructions (Signed)
Below is our plan:  We will continue gabapentin 600mg  at bedtime, duloxetine 60mg  at bedtime and restart lamotrigine per titration on bottle. Titrate to 100mg  daily. We will refer you to pain management for consideration of ESI and management of fibromyalgia.   Please make sure you are staying well hydrated. I recommend 50-60 ounces daily. Well balanced diet and regular exercise encouraged. Consistent sleep schedule with 6-8 hours recommended.   Please continue follow up with care team as directed.   Follow up in 6 months   You may receive a survey regarding today's visit. I encourage you to leave honest feed back as I do use this information to improve patient care. Thank you for seeing me today!

## 2021-10-23 NOTE — Progress Notes (Unsigned)
No chief complaint on file.   HISTORY OF PRESENT ILLNESS:  10/23/21 ALL:  Charlene Griffin is a 31 y.o. female here today for follow up for migraines. She was seen in consult with Charlene Griffin 06/2021. Gabapentin was increased to 618m TID, lamotrigine added and titrated to 1011mBID and duloxetine 6070montinued. Nurtec used for abortive therapy. Since,    HISTORY (copied from Charlene YanRhea Griffin note)  Charlene Griffin 45ar old female, seen in request by primary care nurse practitioner   Charlene Heritageor evaluation of increased frequency of migraine headache, frequent muscle spasm, initial evaluation was on July 03, 2021   I reviewed and summarized the referring note. PMHX. Celiac disease Chronic Migraine Fibromyogia   She has a long history of chronic migraine headaches, was previously on headache clinic, reported tried different preventive medications in the past, including Topamax, and many others she forgot to name, also tried different treatments in the past, with suboptimal response, eventually her headache was under better control after she switches to gluten-free diet, also underwent physical therapy, but she began to have increased frequency of the headaches since beginning of 2023, reported 3-4 migraine headaches each week in May 2023, sometimes headache can last for more than 1 week  Her typical migraine starting at the occipital region, then become lateralized severe pressure/pounding headache with light noise sensitivity, nauseous, sometimes the pain was so intense, to the-point she has transient passing out episode, she presented to emergency room multiple times since 2023, for variable reasons, most recent 1 on June 12, 2021 for prolonged headache that resolved by Decadron fusion and Tylenol  She had extensive imaging study, I personally reviewed CT head showed no significant abnormality.  MRI of cervical, thoracic, lumbar spine showed mild cervical and lumbar  degenerative changes, no evidence of canal or foraminal narrowing  Laboratory evaluation showed normal CBC hemoglobin of 14.3, normal CMP, with exception of slight decreased sodium 134, normal calcium 9.8  Since 2022, she has frequent lower extremity muscle spasm, low back pain, increased since 2023, now she has multiple episode of very painful lower extremity muscle spasm, she showed me few video clips, muscles was caved in at her calf, the muscle spasm and involving bilateral lower extremity muscles sometimes the membranes muscles, sometimes it is so painful difficult for her to bear weight, but in between episode, she denies persistent sensory loss or gait abnormality, she denies bowel and bladder incontinence  She also carries a diagnosis of celiac disease, not based on biopsy, but based on positive response to gluten-free diet   REVIEW OF SYSTEMS: Out of a complete 14 system review of symptoms, the patient complains only of the following symptoms, and all other reviewed systems are negative.   ALLERGIES: Allergies  Allergen Reactions   Botox [Botulinum Toxin Type A] Rash and Other (See Comments)    Mood changes    Benadryl [Diphenhydramine] Nausea And Vomiting   Gluten Meal     Blister & hives     HOME MEDICATIONS: Outpatient Medications Prior to Visit  Medication Sig Dispense Refill   albuterol (PROVENTIL) (2.5 MG/3ML) 0.083% nebulizer solution Inhale into the lungs as needed.     dicyclomine (BENTYL) 20 MG tablet Take 20 mg by mouth every 6 (six) hours.     DULoxetine (CYMBALTA) 60 MG capsule Take 60 mg by mouth daily.     gabapentin (NEURONTIN) 300 MG capsule Take 2 capsules (600 mg total) by mouth 3 (three) times daily. [place]  capsule 6   lamoTRIgine (LAMICTAL) 100 MG tablet Take 1 tablet (100 mg total) by mouth at bedtime. 30 tablet 6   lamoTRIgine (LAMICTAL) 25 MG tablet 1 qhs x 1st wk 2 qhs x  2nd wk 3 qhs x 3rd wk 42 tablet 0   methocarbamol (ROBAXIN) 500 MG tablet Take 1  tablet (500 mg total) by mouth 2 (two) times daily. 20 tablet 0   montelukast (SINGULAIR) 10 MG tablet Take 1 tablet (10 mg total) by mouth at bedtime. 30 tablet 0   naproxen (NAPROSYN) 500 MG tablet Take 1 tablet (500 mg total) by mouth 2 (two) times daily. 30 tablet 0   norethindrone (MICRONOR) 0.35 MG tablet Take 1 tablet by mouth daily.     Rimegepant Sulfate (NURTEC) 75 MG TBDP Take 1 tab at onset of migraine.  May repeat in 2 hrs, if needed.  Max dose: 2 tabs/day. This is a 30 day prescription. 12 tablet 11   No facility-administered medications prior to visit.     PAST MEDICAL HISTORY: Past Medical History:  Diagnosis Date   ADHD    Allergy    Alopecia    Anxiety    Bronchitis    Celiac disease    Complication of anesthesia    intubation bronchitis   GERD (gastroesophageal reflux disease)    rare   Hemorrhoids    Immune deficiency disorder ([place])    Insomnia    Major depression    Migraine with aura    Panic attack    with surgery   PCOS (polycystic ovarian syndrome)    PONV (postoperative nausea and vomiting)    Tinnitus    Umbilical hernia      PAST SURGICAL HISTORY: Past Surgical History:  Procedure Laterality Date   HERNIA REPAIR     x 2   INCISIONAL HERNIA REPAIR N/A 09/02/2019   Procedure: LAPAROSCOPIC INCISIONAL HERNIA REPAIR WITH MESH;  Surgeon: Griffin, Charlene Bruce, MD;  Location: [facility];  Service: General;  Laterality: N/A;   LAPAROSCOPY N/A 12/29/2018   Procedure: LAPAROSCOPY DIAGNOSTIC;  Surgeon: Charlene Dom, MD;  Location: [place];  Service: Gynecology;  Laterality: N/A;  1 hour surgery time/ follow Charlene Charlene Griffin case   LEFT OOPHORECTOMY     OVARIAN CYST REMOVAL Left 12/29/2018   Procedure: LAPARASCOPIC OOPHARECTOMY;  Surgeon: Charlene Dom, MD;  Location: [facility];  Service: Gynecology;  Laterality: Left;   WISDOM TOOTH EXTRACTION       FAMILY HISTORY: Family History  Problem Relation Age of  Onset   Colon cancer Maternal Grandfather        passed away 23   Healthy Mother    Healthy Father    Asthma Sister    Factor V Leiden deficiency Sister    Ovarian cancer Paternal Grandmother      SOCIAL HISTORY: Social History   Socioeconomic History   Marital status: Married    Spouse name: Not on file   Number of children: Not on file   Years of education: Not on file   Highest education level: Not on file  Occupational History   Not on file  Tobacco Use   Smoking status: Never   Smokeless tobacco: Never  Vaping Use   Vaping Use: Some days   Substances: Nicotine, Flavoring  Substance and Sexual Activity   Alcohol use: Yes    Comment: socially   Drug use: Never   Sexual activity: Yes    Birth  control/protection: Pill  Other Topics Concern   Not on file  Social History Narrative   Not on file   Social Determinants of Health   Financial Resource Strain: Not on file  Food Insecurity: Not on file  Transportation Needs: Not on file  Physical Activity: Not on file  Stress: Not on file  Social Connections: Not on file  Intimate Partner Violence: Not on file     PHYSICAL EXAM  There were no vitals filed for this visit. There is no height or weight on file to calculate BMI.  Generalized: Well developed, in no acute distress  Cardiology: normal rate and rhythm, no murmur auscultated  Respiratory: clear to auscultation bilaterally    Neurological examination  Mentation: Alert oriented to time, place, history taking. Follows all commands speech and language fluent Cranial nerve II-XII: Pupils were equal round reactive to light. Extraocular movements were full, visual field were full on confrontational test. Facial sensation and strength were normal. Uvula tongue midline. Head turning and shoulder shrug  were normal and symmetric. Motor: The motor testing reveals 5 over 5 strength of all 4 extremities. Good symmetric motor tone is noted throughout.  Sensory:  Sensory testing is intact to soft touch on all 4 extremities. No evidence of extinction is noted.  Coordination: Cerebellar testing reveals good finger-nose-finger and heel-to-shin bilaterally.  Gait and station: Gait is normal. Tandem gait is normal. Romberg is negative. No drift is seen.  Reflexes: Deep tendon reflexes are symmetric and normal bilaterally.    DIAGNOSTIC DATA (LABS, IMAGING, TESTING) - I reviewed patient records, labs, notes, testing and imaging myself where available.  Lab Results  Component Value Date   WBC 8.0 06/12/2021   HGB 14.3 06/12/2021   HCT 41.0 06/12/2021   MCV 89.9 06/12/2021   PLT 348 06/12/2021      Component Value Date/Time   NA 137 06/12/2021 2109   K 3.7 06/12/2021 2109   CL 101 06/12/2021 2109   CO2 28 06/12/2021 2109   GLUCOSE 94 06/12/2021 2109   BUN 15 06/12/2021 2109   CREATININE 0.76 06/12/2021 2109   CREATININE 0.80 07/24/2019 1445   CALCIUM 9.6 07/03/2021 1028   PROT 8.5 (H) 05/05/2021 1422   ALBUMIN 4.8 05/05/2021 1422   AST 27 05/05/2021 1422   AST 18 07/24/2019 1445   ALT 28 05/05/2021 1422   ALT 18 07/24/2019 1445   ALKPHOS 55 05/05/2021 1422   BILITOT 1.0 05/05/2021 1422   BILITOT 1.1 07/24/2019 1445   GFRNONAA >60 06/12/2021 2109   GFRNONAA >60 07/24/2019 1445   GFRAA >60 09/06/2019 1742   GFRAA >60 07/24/2019 1445   Lab Results  Component Value Date   CHOL 163 04/23/2019   HDL 43 04/23/2019   LDLCALC 106 (H) 04/23/2019   TRIG 72 04/23/2019   CHOLHDL 3.8 04/23/2019   No results found for: "HGBA1C" Lab Results  Component Value Date   VITAMINB12 385 07/03/2021   Lab Results  Component Value Date   TSH 0.705 07/03/2021        No data to display               No data to display           ASSESSMENT AND PLAN  31 y.o. year old female  has a past medical history of ADHD, Allergy, Alopecia, Anxiety, Bronchitis, Celiac disease, Complication of anesthesia, GERD (gastroesophageal reflux disease),  Hemorrhoids, Immune deficiency disorder ([place]), Insomnia, Major depression, Migraine with aura, Panic attack,  PCOS (polycystic ovarian syndrome), PONV (postoperative nausea and vomiting), Tinnitus, and Umbilical hernia. here with    No diagnosis found.  Charlene Griffin ***.  Healthy lifestyle habits encouraged. *** will follow up with PCP as directed. *** will return to see me in ***, sooner if needed. *** verbalizes understanding and agreement with this plan.   No orders of the defined types were placed in this encounter.    No orders of the defined types were placed in this encounter.    Debbora Presto, MSN, FNP-C 10/23/2021, 4:39 PM  [facility] [address], [place], [place] [address] [phone]

## 2021-10-25 ENCOUNTER — Encounter: Payer: Self-pay | Admitting: Family Medicine

## 2021-10-25 ENCOUNTER — Ambulatory Visit (INDEPENDENT_AMBULATORY_CARE_PROVIDER_SITE_OTHER): Admitting: Family Medicine

## 2021-10-25 VITALS — BP 113/78 | HR 82 | Ht 66.0 in | Wt 166.5 lb

## 2021-10-25 DIAGNOSIS — G43709 Chronic migraine without aura, not intractable, without status migrainosus: Secondary | ICD-10-CM | POA: Diagnosis not present

## 2021-10-25 DIAGNOSIS — M5136 Other intervertebral disc degeneration, lumbar region: Secondary | ICD-10-CM

## 2021-10-25 DIAGNOSIS — M545 Low back pain, unspecified: Secondary | ICD-10-CM

## 2021-10-25 DIAGNOSIS — M797 Fibromyalgia: Secondary | ICD-10-CM | POA: Diagnosis not present

## 2021-10-25 MED ORDER — LAMOTRIGINE 100 MG PO TABS: 100.0000 mg | ORAL_TABLET | Freq: Every day | ORAL | 6 refills | Status: DC

## 2021-11-23 ENCOUNTER — Encounter: Payer: Self-pay | Admitting: Physical Medicine & Rehabilitation

## 2021-11-23 ENCOUNTER — Encounter: Attending: Physical Medicine & Rehabilitation | Admitting: Physical Medicine & Rehabilitation

## 2021-11-23 VITALS — BP 118/75 | HR 81 | Ht 66.0 in | Wt 168.0 lb

## 2021-11-23 DIAGNOSIS — M62838 Other muscle spasm: Secondary | ICD-10-CM

## 2021-11-23 DIAGNOSIS — M47812 Spondylosis without myelopathy or radiculopathy, cervical region: Secondary | ICD-10-CM

## 2021-11-23 DIAGNOSIS — M533 Sacrococcygeal disorders, not elsewhere classified: Secondary | ICD-10-CM

## 2021-11-23 DIAGNOSIS — M4306 Spondylolysis, lumbar region: Secondary | ICD-10-CM | POA: Diagnosis not present

## 2021-11-23 DIAGNOSIS — M797 Fibromyalgia: Secondary | ICD-10-CM | POA: Diagnosis not present

## 2021-11-23 MED ORDER — PREGABALIN 75 MG PO CAPS: 75.0000 mg | ORAL_CAPSULE | Freq: Two times a day (BID) | ORAL | 2 refills | Status: DC

## 2021-11-23 NOTE — Progress Notes (Signed)
Subjective:    Patient ID: Charlene Griffin, female    DOB: 05/10/1990, 31 y.o.   MRN: [id number]  HPI Charlene Griffin is a 31 y.o. year old female  who  has a past medical history of ADHD, Allergy, Alopecia, Anxiety, Bronchitis, Celiac disease, Complication of anesthesia, GERD (gastroesophageal reflux disease), Hemorrhoids, Immune deficiency disorder ([place]), Insomnia, Major depression, Migraine with aura, Panic attack, PCOS (polycystic ovarian syndrome), PONV (postoperative nausea and vomiting), Tinnitus, and Umbilical hernia.   She has been diagnosed with fibromyalgia.  They are presenting to PM&R clinic as a new patient for pain management evaluation.  She reports she has had migraines for many years.  About 2 years ago she developed pain in her lower back and hips.  Pain is sometimes worse on the left and other times worse on the right.  Pain is worsened by any type of activity.  She is unable to work consistently due to her chronic pain.  Pain is worse when the weather changes.  She is also developed a tremor that is worse in her right lower extremity.  Tremor is worse when pain is most severe.  She reports she has had this since the beginning of the year.  She reports a addiction history in her family and would like to avoid opioid medications.  She reports poor sleep.  Chronic anxiety and depression.  No SI or HI.  Reports a lot of stress due to her pain and not being able to work.  Prior constipation but this improved after she stopped eating gluten. She is currently on birth control and not try to get pregnant.  She may consider getting pregnant in the future.  She reports she felt the best when she was in a warm dry climate.  She reports the pain is so severe it will cause her to vomit.  She reports muscle cramping in her legs.  Denies bowel or bladder incontinence.   Prior treatments Massage helps Dry needling helps SI joint belts help previously SI joint injections and  ESI  injections by Dr. Dorene Ar She has been doing PT twice a week Home exercise program with mild benefit, she tries to do 10 to 20 minutes of walking Daily stretching  Prior medications She is currently on Cymbalta She takes gabapentin 300 mg.  She only takes at night due to sedation.  She not sure if it is helping PCP has recently started sertraline Tylenol and Advil do not help  Pain Inventory Average Pain 9 Pain Right Now 9 My pain is constant, sharp, dull, stabbing, tingling, and aching  In the last 24 hours, has pain interfered with the following? General activity 8 Relation with others 8 Enjoyment of life 8 What TIME of day is your pain at its worst? morning  and night Sleep (in general) Fair  Pain is worse with: walking, bending, sitting, standing, and some activites Pain improves with:  nothing Relief from Meds: 1  use a cane how many minutes can you walk? 10-20 ability to climb steps?  yes do you drive?  yes  employed # of hrs/week 34 what is your job? [employer] I need assistance with the following:  meal prep, household duties, and shopping  tremor trouble walking spasms dizziness depression anxiety  Any changes since last visit?  no  Any changes since last visit?  no    Family History  Problem Relation Age of Onset   Colon cancer Maternal Grandfather  passed away 2014   Healthy Mother    Healthy Father    Asthma Sister    Factor V Leiden deficiency Sister    Ovarian cancer Paternal Grandmother    Social History   Socioeconomic History   Marital status: Married    Spouse name: Not on file   Number of children: Not on file   Years of education: Not on file   Highest education level: Not on file  Occupational History   Not on file  Tobacco Use   Smoking status: Never   Smokeless tobacco: Never  Vaping Use   Vaping Use: Some days   Substances: Nicotine, Flavoring  Substance and Sexual Activity   Alcohol use: Yes    Comment:  socially   Drug use: Never   Sexual activity: Yes    Birth control/protection: Pill  Other Topics Concern   Not on file  Social History Narrative   Not on file   Social Determinants of Health   Financial Resource Strain: Not on file  Food Insecurity: Not on file  Transportation Needs: Not on file  Physical Activity: Not on file  Stress: Not on file  Social Connections: Not on file   Past Surgical History:  Procedure Laterality Date   HERNIA REPAIR     x 2   INCISIONAL HERNIA REPAIR N/A 09/02/2019   Procedure: LAPAROSCOPIC INCISIONAL HERNIA REPAIR WITH MESH;  Surgeon: Kinsinger, Arta Bruce, MD;  Location: [facility];  Service: General;  Laterality: N/A;   LAPAROSCOPY N/A 12/29/2018   Procedure: LAPAROSCOPY DIAGNOSTIC;  Surgeon: Salvadore Dom, MD;  Location: [place];  Service: Gynecology;  Laterality: N/A;  1 hour surgery time/ follow Dr Elza Rafter case   LEFT OOPHORECTOMY     OVARIAN CYST REMOVAL Left 12/29/2018   Procedure: LAPARASCOPIC OOPHARECTOMY;  Surgeon: Salvadore Dom, MD;  Location: [facility];  Service: Gynecology;  Laterality: Left;   WISDOM TOOTH EXTRACTION     Past Medical History:  Diagnosis Date   ADHD    Allergy    Alopecia    Anxiety    Bronchitis    Celiac disease    Complication of anesthesia    intubation bronchitis   GERD (gastroesophageal reflux disease)    rare   Hemorrhoids    Immune deficiency disorder ([place])    Insomnia    Major depression    Migraine with aura    Panic attack    with surgery   PCOS (polycystic ovarian syndrome)    PONV (postoperative nausea and vomiting)    Tinnitus    Umbilical hernia    BP 287/86   Pulse 81   Ht 5' 6"  (1.676 m)   Wt 168 lb (76.2 kg)   SpO2 99%   BMI 27.12 kg/m   Opioid Risk Score:   Fall Risk Score:  `1  Depression screen [facility] 2/9     11/23/2021    2:06 PM 07/14/2020    3:28 PM  Depression screen PHQ 2/9  Decreased Interest 3 0  Down, Depressed,  Hopeless 2 0  PHQ - 2 Score 5 0  Altered sleeping 3   Tired, decreased energy 3   Change in appetite 1   Feeling bad or failure about yourself  1   Trouble concentrating 0   Moving slowly or fidgety/restless 3   Suicidal thoughts 0   PHQ-9 Score 16   Difficult doing work/chores Very difficult      Review of Systems  Constitutional: Negative.   HENT: Negative.    Eyes: Negative.   Respiratory: Negative.    Cardiovascular: Negative.   Gastrointestinal:  Positive for abdominal pain, constipation, diarrhea, nausea and vomiting.  Endocrine: Negative.   Genitourinary: Negative.   Musculoskeletal:  Positive for gait problem.       Spasms  Skin: Negative.   Allergic/Immunologic: Negative.   Neurological:  Positive for dizziness and tremors.  Hematological: Negative.   Psychiatric/Behavioral:  Positive for dysphoric mood. The patient is nervous/anxious.   All other systems reviewed and are negative.      Objective:   Physical Exam   Gen: no distress, normal appearing HEENT: oral mucosa pink and moist, NCAT Chest: normal effort, normal rate of breathing Abd: soft, non-distended Ext: no edema Psych: pleasant, normal affect Skin: intact Neuro: Awake and alert, follows commands, cranial nerves II through XII grossly intact, strength 5 out of 5 in all 4 extremities, sensation intact in all 4 extremities Right lower extremity tremor-improved during MSK testing when her hip was internally and externally rotated No dysmetria Musculoskeletal: Tenderness to palpation throughout lumbar thoracic and cervical paraspinal muscles, pain throughout periscapular muscles, tenderness around bilateral shoulders, bilateral hips tenderness noted in her thighs Left wrist tenderness, bilateral elbow tenderness noted No significant tenderness around the knees and ankles with some mild  Spurling's negative SLR negative, FABER and FADIR resulted in diffuse pain throughout her back Normal gait Normal  muscle tone and bulk     Head CT 06/12/21 No acute intracranial abnormality.   MRI spine 04/03/21 IMPRESSION: 1. Normal MRI appearance of the spinal cord. No cord signal changes to suggest myelopathy. No abnormal enhancement. 2. Mild degenerative disc disease at C3-4 through C6-7, with additional mild noncompressive disc bulging at L2-3. No significant stenosis or neural impingement. 3. Mild facet hypertrophy at L3-4 and L4-5.    Assessment & Plan:   Fibromyalgia -Continue duloxetine 60 mg daily -Discontinue gabapentin we will start Lyrica 75 mg twice daily.  She can try taking this medication at night first to see how she tolerates it. -She denies being pregnant and is not currently trying to get pregnant.  Discussed risk of using this medication during pregnancy. -She would like to avoid opioid medications -Reports that she has had rheumatological referral in the past and may have an appointment scheduled.  Consider repeat consult to rheumatology to rule out rheumatological condition  Lumbar and cervical spondylosis, history of SI joint dysfunction -These conditions may be contributing to her pain, however pain is very diffuse and I suspect it is more likely consistent with fibromyalgia  Tremor and muscle spasms in right lower extremity -Unclear origin, she does report it worsened when her pain is more severe. -Continue follow-up with neurology -Do not know if gabapentin could be contributing, will monitor for changes as she discontinues gabapentin and tries Lyrica  Migraine headache -Continue follow-up with neurology  Depression and anxiety Currently on duloxetine and Zoloft, will be cautious to add additional serotonergic drug at this time

## 2022-01-09 ENCOUNTER — Encounter: Payer: Self-pay | Admitting: Physical Medicine & Rehabilitation

## 2022-01-09 ENCOUNTER — Encounter: Attending: Physical Medicine & Rehabilitation | Admitting: Physical Medicine & Rehabilitation

## 2022-01-09 VITALS — BP 117/73 | HR 75 | Ht 66.0 in | Wt 180.0 lb

## 2022-01-09 DIAGNOSIS — M533 Sacrococcygeal disorders, not elsewhere classified: Secondary | ICD-10-CM | POA: Diagnosis not present

## 2022-01-09 DIAGNOSIS — M2550 Pain in unspecified joint: Secondary | ICD-10-CM | POA: Insufficient documentation

## 2022-01-09 DIAGNOSIS — M4306 Spondylolysis, lumbar region: Secondary | ICD-10-CM | POA: Insufficient documentation

## 2022-01-09 DIAGNOSIS — M47812 Spondylosis without myelopathy or radiculopathy, cervical region: Secondary | ICD-10-CM | POA: Insufficient documentation

## 2022-01-09 DIAGNOSIS — M62838 Other muscle spasm: Secondary | ICD-10-CM | POA: Insufficient documentation

## 2022-01-09 DIAGNOSIS — M797 Fibromyalgia: Secondary | ICD-10-CM | POA: Diagnosis not present

## 2022-01-09 NOTE — Progress Notes (Unsigned)
Subjective:    Patient ID: Charlene Griffin, female    DOB: [birth year], 32 y.o.   MRN: [id number]  HPI Charlene Griffin is a 32 y.o. year old female  who  has a past medical history of ADHD, Allergy, Alopecia, Anxiety, Bronchitis, Celiac disease, Complication of anesthesia, GERD (gastroesophageal reflux disease), Hemorrhoids, Immune deficiency disorder (HCC), Insomnia, Major depression, Migraine with aura, Panic attack, PCOS (polycystic ovarian syndrome), PONV (postoperative nausea and vomiting), Tinnitus, and Umbilical hernia.   She has been diagnosed with fibromyalgia.  They are presenting to PM&R clinic as a new patient for pain management evaluation.  She reports she has had migraines for many years.  About 2 years ago she developed pain in her lower back and hips.  Pain is sometimes worse on the left and other times worse on the right.  Pain is worsened by any type of activity.  She is unable to work consistently due to her chronic pain.  Pain is worse when the weather changes.  She is also developed a tremor that is worse in her right lower extremity.  Tremor is worse when pain is most severe.  She reports she has had this since the beginning of the year.  She reports a addiction history in her family and would like to avoid opioid medications.  She reports poor sleep.  Chronic anxiety and depression.  No SI or HI.  Reports a lot of stress due to her pain and not being able to work.  Prior constipation but this improved after she stopped eating gluten. She is currently on birth control and not try to get pregnant.  She may consider getting pregnant in the future.  She reports she felt the best when she was in a warm dry climate.  She reports the pain is so severe it will cause her to vomit.  She reports muscle cramping in her legs.  Denies bowel or bladder incontinence.     Prior treatments Massage helps Dry needling helps SI joint belts help previously SI joint injections and  ESI  injections by Dr. Peggye Pitt She has been doing PT twice a week Home exercise program with mild benefit, she tries to do 10 to 20 minutes of walking Daily stretching   Prior medications She is currently on Cymbalta She takes gabapentin 300 mg.  She only takes at night due to sedation.  She not sure if it is helping PCP has recently started sertraline Tylenol and Advil do not help    Interval history Charlene Griffin is here for follow-up regarding her chronic pain.  She continues to have pain mostly in her back and neck.  She reports that the Lyrica has decreased her overall pain significantly.  She has noted some weight gain.  She is uncertain if this is due to increased appetite from the Lyrica or due to a decrease in vomiting episodes.  Previously she reports pain had caused her to have vomiting however this is not occurring as frequently.  She also feels like her jaw is more tight since using the medication.  She has an appointment with dentistry for possible new mouthguard at night.  She reports having fewer tremors in her right lower extremity however when they occur they last longer.  She is not attempting to get pregnant at this time.  She has been attempting to exercise by doing walks and using a stationary bike.  She does  Plan to increase her activity further.  She reports  TENS unit when she uses this during a physical therapy session.    Pain Inventory Average Pain 5 Pain Right Now 5 My pain is intermittent, sharp, stabbing, and aching  In the last 24 hours, has pain interfered with the following? General activity 7 Relation with others 7 Enjoyment of life 7 What TIME of day is your pain at its worst? morning  and night Sleep (in general) Poor  Pain is worse with: walking, bending, sitting, and inactivity Pain improves with: rest, heat/ice, therapy/exercise, and medication Relief from Meds:  good  Family History  Problem Relation Age of Onset   Colon cancer  Maternal Grandfather        passed away 1   Healthy Mother    Healthy Father    Asthma Sister    Factor V Leiden deficiency Sister    Ovarian cancer Paternal Grandmother    Social History   Socioeconomic History   Marital status: Married    Spouse name: Not on file   Number of children: Not on file   Years of education: Not on file   Highest education level: Not on file  Occupational History   Not on file  Tobacco Use   Smoking status: Never   Smokeless tobacco: Never  Vaping Use   Vaping Use: Some days   Substances: Nicotine, Flavoring  Substance and Sexual Activity   Alcohol use: Yes    Comment: socially   Drug use: Never   Sexual activity: Yes    Birth control/protection: Pill  Other Topics Concern   Not on file  Social History Narrative   Not on file   Social Determinants of Health   Financial Resource Strain: Not on file  Food Insecurity: Not on file  Transportation Needs: Not on file  Physical Activity: Not on file  Stress: Not on file  Social Connections: Not on file   Past Surgical History:  Procedure Laterality Date   HERNIA REPAIR     x 2   [place] N/A 09/02/2019   Procedure: LAPAROSCOPIC INCISIONAL HERNIA REPAIR WITH MESH;  Surgeon: Kinsinger, Arta Bruce, MD;  Location: [facility];  Service: General;  Laterality: N/A;   LAPAROSCOPY N/A 12/29/2018   Procedure: LAPAROSCOPY DIAGNOSTIC;  Surgeon: Salvadore Dom, MD;  Location: [place];  Service: Gynecology;  Laterality: N/A;  1 hour surgery time/ follow Dr Elza Rafter case   LEFT OOPHORECTOMY     OVARIAN CYST REMOVAL Left 12/29/2018   Procedure: LAPARASCOPIC OOPHARECTOMY;  Surgeon: Salvadore Dom, MD;  Location: [facility];  Service: Gynecology;  Laterality: Left;   WISDOM TOOTH EXTRACTION     Past Surgical History:  Procedure Laterality Date   HERNIA REPAIR     x 2   INCISIONAL HERNIA REPAIR N/A 09/02/2019   Procedure: LAPAROSCOPIC  INCISIONAL HERNIA REPAIR WITH MESH;  Surgeon: Kinsinger, Arta Bruce, MD;  Location: [facility];  Service: General;  Laterality: N/A;   LAPAROSCOPY N/A 12/29/2018   Procedure: LAPAROSCOPY DIAGNOSTIC;  Surgeon: Salvadore Dom, MD;  Location: [facility];  Service: Gynecology;  Laterality: N/A;  1 hour surgery time/ follow Dr Elza Rafter case   LEFT OOPHORECTOMY     OVARIAN CYST REMOVAL Left 12/29/2018   Procedure: LAPARASCOPIC OOPHARECTOMY;  Surgeon: Salvadore Dom, MD;  [facility];  Service: Gynecology;  Laterality: Left;   WISDOM TOOTH EXTRACTION     Past Medical History:  Diagnosis Date   ADHD  Allergy    Alopecia    Anxiety    Bronchitis    Celiac disease    Complication of anesthesia    intubation bronchitis   GERD (gastroesophageal reflux disease)    rare   Hemorrhoids    Immune deficiency disorder (HCC)    Insomnia    Major depression    Migraine with aura    Panic attack    with surgery   PCOS (polycystic ovarian syndrome)    PONV (postoperative nausea and vomiting)    Tinnitus    Umbilical hernia    BP 322/02   Pulse 75   Ht 5\' 6"  (1.676 m)   Wt 180 lb (81.6 kg)   BMI 29.05 kg/m   Opioid Risk Score:   Fall Risk Score:  `1  Depression screen [facility] 2/9     11/23/2021    2:06 PM 07/14/2020    3:28 PM  Depression screen PHQ 2/9  Decreased Interest 3 0  Down, Depressed, Hopeless 2 0  PHQ - 2 Score 5 0  Altered sleeping 3   Tired, decreased energy 3   Change in appetite 1   Feeling bad or failure about yourself  1   Trouble concentrating 0   Moving slowly or fidgety/restless 3   Suicidal thoughts 0   PHQ-9 Score 16   Difficult doing work/chores Very difficult     Review of Systems  Musculoskeletal:  Positive for back pain and neck pain.       Pain in both hips  All other systems reviewed and are negative.      Objective:   Physical Exam    Gen: no distress, normal appearing HEENT: oral mucosa pink  and moist, NCAT Chest: normal effort, normal rate of breathing Abd: soft, non-distended Ext: no edema Psych: pleasant, normal affect Skin: intact Neuro: Awake and alert, follows commands, cranial nerves II through XII grossly intact, strength 5 out of 5 in all 4 extremities, sensation intact in all 4 extremities Right lower extremity tremor noted  Musculoskeletal: Tenderness to palpation throughout lumbar thoracic and cervical paraspinal muscles, pain throughout periscapular muscles, tenderness around bilateral  tenderness noted in her thighs Minimal Left wrist tenderness, bilateral elbow tenderness, shoulders, bilateral hips -improved from last visit Spurling's negative Slump negative Normal gait Normal muscle tone and bulk       Head CT 06/12/21 No acute intracranial abnormality.    MRI spine 04/03/21 IMPRESSION: 1. Normal MRI appearance of the spinal cord. No cord signal changes to suggest myelopathy. No abnormal enhancement. 2. Mild degenerative disc disease at C3-4 through C6-7, with additional mild noncompressive disc bulging at L2-3. No significant stenosis or neural impingement. 3. Mild facet hypertrophy at L3-4 and L4-5.     Assessment & Plan:  Fibromyalgia -Continue duloxetine 60 mg daily -Continue  Lyrica 75 mg twice daily.   -She denies being pregnant and is not currently trying to get pregnant. Discussed risk of using medication if pregnant.  -She would like to avoid opioid medications -Will place order for zynex device   Lumbar and cervical spondylosis, history of SI joint dysfunction -These conditions may be contributing to her pain, however pain is very diffuse and I suspect it is more likely consistent with fibromyalgia -Reports she had rheumatology consult ordered in past but she says she never got a call, will place consult, she would like to rule out other possible rheumatological diseases, she does have hx of SI joint dysfunction that can sometimes be  associated with seronegative spondyloarthropathies   Tremor and muscle spasms in right lower extremity -Unclear origin, she does report it worsened when her pain is more severe. -Continue follow-up with neurology, she reports f/u scheduled for this   Migraine headache -Continue follow-up with neurology   Depression and anxiety -Currently on duloxetine and Zoloft, will be cautious to add additional serotonergic drug at this time

## 2022-01-17 ENCOUNTER — Telehealth: Payer: Self-pay | Admitting: Family Medicine

## 2022-01-17 NOTE — Telephone Encounter (Signed)
Pt called stating that she has been having tremors since April and now they are getting worse to the point of being in pain. Pt would like to be advise.

## 2022-03-16 ENCOUNTER — Encounter: Attending: Physical Medicine & Rehabilitation | Admitting: Physical Medicine & Rehabilitation

## 2022-03-16 ENCOUNTER — Encounter: Payer: Self-pay | Admitting: Physical Medicine & Rehabilitation

## 2022-03-16 VITALS — BP 115/63 | HR 70 | Ht 66.0 in | Wt 180.0 lb

## 2022-03-16 DIAGNOSIS — M2550 Pain in unspecified joint: Secondary | ICD-10-CM | POA: Insufficient documentation

## 2022-03-16 DIAGNOSIS — M4306 Spondylolysis, lumbar region: Secondary | ICD-10-CM | POA: Diagnosis not present

## 2022-03-16 DIAGNOSIS — F32A Depression, unspecified: Secondary | ICD-10-CM | POA: Insufficient documentation

## 2022-03-16 DIAGNOSIS — M62838 Other muscle spasm: Secondary | ICD-10-CM | POA: Insufficient documentation

## 2022-03-16 DIAGNOSIS — M797 Fibromyalgia: Secondary | ICD-10-CM | POA: Insufficient documentation

## 2022-03-16 DIAGNOSIS — M47812 Spondylosis without myelopathy or radiculopathy, cervical region: Secondary | ICD-10-CM | POA: Insufficient documentation

## 2022-03-16 MED ORDER — TIZANIDINE HCL 2 MG PO CAPS: 2.0000 mg | ORAL_CAPSULE | Freq: Three times a day (TID) | ORAL | 3 refills | Status: DC

## 2022-03-16 NOTE — Progress Notes (Unsigned)
Subjective:    Patient ID: Charlene Griffin, female    DOB: 13-Jan-1990, 32 y.o.   MRN: [id number]  HPI  Tens unit helps  Work limited by pain Muscle spasms Pain Inventory Average Pain 9 Pain Right Now 8 My pain is constant, sharp, stabbing, tingling, and aching  In the last 24 hours, has pain interfered with the following? General activity 9 Relation with others 9 Enjoyment of life 9 What TIME of day is your pain at its worst? morning  and evening Sleep (in general) Poor  Pain is worse with: walking, bending, sitting, and standing Pain improves with: rest and TENS Relief from Meds: 1  Family History  Problem Relation Age of Onset   Colon cancer Maternal Grandfather        passed away 56   Healthy Mother    Healthy Father    Asthma Sister    Factor V Leiden deficiency Sister    Ovarian cancer Paternal Grandmother    Social History   Socioeconomic History   Marital status: Married    Spouse name: Not on file   Number of children: Not on file   Years of education: Not on file   Highest education level: Not on file  Occupational History   Not on file  Tobacco Use   Smoking status: Never   Smokeless tobacco: Never  Vaping Use   Vaping Use: Some days   Substances: Nicotine, Flavoring  Substance and Sexual Activity   Alcohol use: Yes    Comment: socially   Drug use: Never   Sexual activity: Yes    Birth control/protection: Pill  Other Topics Concern   Not on file  Social History Narrative   Not on file   Social Determinants of Health   Financial Resource Strain: Not on file  Food Insecurity: Not on file  Transportation Needs: Not on file  Physical Activity: Not on file  Stress: Not on file  Social Connections: Not on file   Past Surgical History:  Procedure Laterality Date   HERNIA REPAIR     x 2   [place] N/A 09/02/2019   Procedure: LAPAROSCOPIC INCISIONAL HERNIA REPAIR WITH MESH;  Surgeon: Kinsinger, Arta Bruce, MD;   Location: [facility];  Service: General;  Laterality: N/A;   LAPAROSCOPY N/A 12/29/2018   Procedure: LAPAROSCOPY DIAGNOSTIC;  Surgeon: Salvadore Dom, MD;  Location: [place];  Service: Gynecology;  Laterality: N/A;  1 hour surgery time/ follow Dr Elza Rafter case   LEFT OOPHORECTOMY     OVARIAN CYST REMOVAL Left 12/29/2018   Procedure: LAPARASCOPIC OOPHARECTOMY;  Surgeon: Salvadore Dom, MD;  Location: [facility];  Service: Gynecology;  Laterality: Left;   WISDOM TOOTH EXTRACTION     Past Surgical History:  Procedure Laterality Date   HERNIA REPAIR     x 2   INCISIONAL HERNIA REPAIR N/A 09/02/2019   Procedure: LAPAROSCOPIC INCISIONAL HERNIA REPAIR WITH MESH;  Surgeon: Kinsinger, Arta Bruce, MD;  Location: [facility];  Service: General;  Laterality: N/A;   LAPAROSCOPY N/A 12/29/2018   Procedure: LAPAROSCOPY DIAGNOSTIC;  Surgeon: Salvadore Dom, MD;  Location[facility];  Service: Gynecology;  Laterality: N/A;  1 hour surgery time/ follow Dr Elza Rafter case   LEFT OOPHORECTOMY     OVARIAN CYST REMOVAL Left 12/29/2018   Procedure: LAPARASCOPIC OOPHARECTOMY;  Surgeon: Salvadore Dom, MD;  Location: [facility];  Service: Gynecology;  Laterality: Left;   WISDOM  TOOTH EXTRACTION     Past Medical History:  Diagnosis Date   ADHD    Allergy    Alopecia    Anxiety    Bronchitis    Celiac disease    Complication of anesthesia    intubation bronchitis   GERD (gastroesophageal reflux disease)    rare   Hemorrhoids    Immune deficiency disorder ([place])    Insomnia    Major depression    Migraine with aura    Panic attack    with surgery   PCOS (polycystic ovarian syndrome)    PONV (postoperative nausea and vomiting)    Tinnitus    Umbilical hernia    Pulse 70   Ht '5\' 6"'$  (1.676 m)   Wt 180 lb (81.6 kg)   SpO2 99%   BMI 29.05 kg/m   Opioid Risk Score:   Fall Risk Score:  `1  Depression screen [facility] 2/9      03/16/2022    2:51 PM 01/09/2022    9:55 AM 11/23/2021    2:06 PM 07/14/2020    3:28 PM  Depression screen PHQ 2/9  Decreased Interest 0 0 3 0  Down, Depressed, Hopeless 0 0 2 0  PHQ - 2 Score 0 0 5 0  Altered sleeping   3   Tired, decreased energy   3   Change in appetite   1   Feeling bad or failure about yourself    1   Trouble concentrating   0   Moving slowly or fidgety/restless   3   Suicidal thoughts   0   PHQ-9 Score   16   Difficult doing work/chores   Very difficult       Review of Systems  Musculoskeletal:  Positive for back pain and gait problem.  All other systems reviewed and are negative.     Objective:   Physical Exam        Assessment & Plan:

## 2022-03-17 ENCOUNTER — Encounter: Payer: Self-pay | Admitting: Physical Medicine & Rehabilitation

## 2022-03-17 MED ORDER — TIZANIDINE HCL 2 MG PO CAPS: 2.0000 mg | ORAL_CAPSULE | Freq: Three times a day (TID) | ORAL | 3 refills | Status: DC | PRN

## 2022-03-19 ENCOUNTER — Telehealth: Payer: Self-pay | Admitting: *Deleted

## 2022-03-19 MED ORDER — TIZANIDINE HCL 2 MG PO TABS: 2.0000 mg | ORAL_TABLET | Freq: Three times a day (TID) | ORAL | 3 refills | Status: DC | PRN

## 2022-03-19 NOTE — Telephone Encounter (Signed)
  Started PA on Tizanidine capsules: Per patient's insurance provider.   Tizanidine tablets and other formulary muscle relaxants are available to [employer] beneficiaries without the need of prior authorization. Please consider changing the prescription to the tizanidine tablets or another formulary muscle relaxant.

## 2022-04-16 ENCOUNTER — Other Ambulatory Visit: Payer: Self-pay | Admitting: Physical Medicine & Rehabilitation

## 2022-04-24 ENCOUNTER — Ambulatory Visit: Admitting: Family Medicine

## 2022-04-25 NOTE — Progress Notes (Deleted)
Office Visit Note  Patient: Charlene Griffin             Date of Birth: [birth year]           MRN: [id number]             PCP: Filomena Jungling, NP Referring: Fanny Dance, MD Visit Date: 04/27/2022 Occupation: @  Subjective:  No chief complaint on file.   History of Present Illness: Charlene Griffin is a 32 y.o. female here for evaluation of chronic ongoing joint and body pains with presume fibromyalgia needing evaluation ruling out additional inflammatory condition or underlying cause of arthritis. Lab testing last year was negative for ANA.***     Activities of Daily Living:  Patient reports morning stiffness for *** {minute/hour:19697}.   Patient {ACTIONS;DENIES/REPORTS:21021675::"Denies"} nocturnal pain.  Difficulty dressing/grooming: {ACTIONS;DENIES/REPORTS:21021675::"Denies"} Difficulty climbing stairs: {ACTIONS;DENIES/REPORTS:21021675::"Denies"} Difficulty getting out of chair: {ACTIONS;DENIES/REPORTS:21021675::"Denies"} Difficulty using hands for taps, buttons, cutlery, and/or writing: {ACTIONS;DENIES/REPORTS:21021675::"Denies"}  No Rheumatology ROS completed.   PMFS History:  Patient Active Problem List   Diagnosis Date Noted   Chronic migraine w/o aura w/o status migrainosus, not intractable 07/03/2021   Muscle spasms of both lower extremities 07/03/2021   Incisional hernia 09/02/2019   Migraine with aura    Non-seasonal allergic rhinitis 04/04/2018   Tinnitus of both ears 04/04/2018   Alopecia areata 02/08/2017   Enlarged thyroid gland 02/08/2017   Anxiety 12/11/2015   Attention deficit hyperactivity disorder (ADHD) 12/11/2015    Past Medical History:  Diagnosis Date   ADHD    Allergy    Alopecia    Anxiety    Bronchitis    Celiac disease    Complication of anesthesia    intubation bronchitis   GERD (gastroesophageal reflux disease)    rare   Hemorrhoids    Immune deficiency disorder (HCC)    Insomnia    Major depression     Migraine with aura    Panic attack    with surgery   PCOS (polycystic ovarian syndrome)    PONV (postoperative nausea and vomiting)    Tinnitus    Umbilical hernia     Family History  Problem Relation Age of Onset   Colon cancer Maternal Grandfather        passed away 72   Healthy Mother    Healthy Father    Asthma Sister    Factor V Leiden deficiency Sister    Ovarian cancer Paternal Grandmother    Past Surgical History:  Procedure Laterality Date   HERNIA REPAIR     x 2   INCISIONAL HERNIA REPAIR N/A 09/02/2019   Procedure: LAPAROSCOPIC INCISIONAL HERNIA REPAIR WITH MESH;  Surgeon: Kinsinger, De Blanch, MD;  Location: [facility];  Service: General;  Laterality: N/A;   LAPAROSCOPY N/A 12/29/2018   Procedure: LAPAROSCOPY DIAGNOSTIC;  Surgeon: Romualdo Bolk, MD;  Location[facility];  Service: Gynecology;  Laterality: N/A;  1 hour surgery time/ follow Dr Rica Records case   LEFT OOPHORECTOMY     OVARIAN CYST REMOVAL Left 12/29/2018   Procedure: LAPARASCOPIC OOPHARECTOMY;  Surgeon: Romualdo Bolk, MD;  Location: [facility];  Service: Gynecology;  Laterality: Left;   WISDOM TOOTH EXTRACTION     Social History   Social History Narrative   Not on file   Immunization History  Administered Date(s) Administered   Influenza,inj,Quad PF,6+ Mos 09/15/2018   Influenza-Unspecified 10/18/2016     Objective: Vital Signs: There were no vitals taken for this visit.  Physical Exam   Musculoskeletal Exam: ***  CDAI Exam: CDAI Score: -- Patient Global: --; Provider Global: -- Swollen: --; Tender: -- Joint Exam 04/27/2022   No joint exam has been documented for this visit   There is currently no information documented on the homunculus. Go to the Rheumatology activity and complete the homunculus joint exam.  Investigation: No additional findings.  Imaging: No results found.  Recent Labs: Lab Results  Component Value Date   WBC 8.0  06/12/2021   HGB 14.3 06/12/2021   PLT 348 06/12/2021   NA 137 06/12/2021   K 3.7 06/12/2021   CL 101 06/12/2021   CO2 28 06/12/2021   GLUCOSE 94 06/12/2021   BUN 15 06/12/2021   CREATININE 0.76 06/12/2021   BILITOT 1.0 05/05/2021   ALKPHOS 55 05/05/2021   AST 27 05/05/2021   ALT 28 05/05/2021   PROT 8.5 (H) 05/05/2021   ALBUMIN 4.8 05/05/2021   CALCIUM 9.6 07/03/2021   GFRAA >60 09/06/2019    Speciality Comments: No specialty comments available.  Procedures:  No procedures performed Allergies: Botox [botulinum toxin type a], Diphenhydramine, Gluten meal, and Onabotulinumtoxina (cosmetic)   Assessment / Plan:     Visit Diagnoses: No diagnosis found.  Orders: No orders of the defined types were placed in this encounter.  No orders of the defined types were placed in this encounter.   Face-to-face time spent with patient was *** minutes. Greater than 50% of time was spent in counseling and coordination of care.  Follow-Up Instructions: No follow-ups on file.   Fuller Plan, MD  Note - This record has been created using [employer].  Chart creation errors have been sought, but may not always  have been located. Such creation errors do not reflect on  the standard of medical care.

## 2022-04-27 ENCOUNTER — Encounter: Admitting: Internal Medicine

## 2022-05-11 ENCOUNTER — Encounter: Attending: Physical Medicine & Rehabilitation | Admitting: Physical Medicine & Rehabilitation

## 2022-05-11 ENCOUNTER — Encounter: Payer: Self-pay | Admitting: Physical Medicine & Rehabilitation

## 2022-05-11 VITALS — BP 120/64 | HR 66 | Ht 66.0 in | Wt 178.0 lb

## 2022-05-11 DIAGNOSIS — M62838 Other muscle spasm: Secondary | ICD-10-CM | POA: Insufficient documentation

## 2022-05-11 DIAGNOSIS — F411 Generalized anxiety disorder: Secondary | ICD-10-CM | POA: Diagnosis not present

## 2022-05-11 DIAGNOSIS — M797 Fibromyalgia: Secondary | ICD-10-CM | POA: Insufficient documentation

## 2022-05-11 DIAGNOSIS — F419 Anxiety disorder, unspecified: Secondary | ICD-10-CM | POA: Diagnosis not present

## 2022-05-11 MED ORDER — PREGABALIN 75 MG PO CAPS: 75.0000 mg | ORAL_CAPSULE | Freq: Three times a day (TID) | ORAL | 4 refills | Status: DC

## 2022-05-11 NOTE — Progress Notes (Unsigned)
Subjective:    Patient ID: Charlene Griffin, female    DOB: [birth year], 32 y.o.   MRN: [id number]  HPI  HPI Charlene Griffin is a 32 y.o. year old female  who  has a past medical history of ADHD, Allergy, Alopecia, Anxiety, Bronchitis, Celiac disease, Complication of anesthesia, GERD (gastroesophageal reflux disease), Hemorrhoids, Immune deficiency disorder (HCC), Insomnia, Major depression, Migraine with aura, Panic attack, PCOS (polycystic ovarian syndrome), PONV (postoperative nausea and vomiting), Tinnitus, and Umbilical hernia.   She has been diagnosed with fibromyalgia.  They are presenting to PM&R clinic as a new patient for pain management evaluation.  She reports she has had migraines for many years.  About 2 years ago she developed pain in her lower back and hips.  Pain is sometimes worse on the left and other times worse on the right.  Pain is worsened by any type of activity.  She is unable to work consistently due to her chronic pain.  Pain is worse when the weather changes.  She is also developed a tremor that is worse in her right lower extremity.  Tremor is worse when pain is most severe.  She reports she has had this since the beginning of the year.  She reports a addiction history in her family and would like to avoid opioid medications.  She reports poor sleep.  Chronic anxiety and depression.  No SI or HI.  Reports a lot of stress due to her pain and not being able to work.  Prior constipation but this improved after she stopped eating gluten. She is currently on birth control and not try to get pregnant.  She may consider getting pregnant in the future.  She reports she felt the best when she was in a warm dry climate.  She reports the pain is so severe it will cause her to vomit.  She reports muscle cramping in her legs.  Denies bowel or bladder incontinence.     Prior treatments Massage helps Dry needling helps SI joint belts help previously SI joint injections and   ESI injections by Dr. Peggye Pitt She has been doing PT twice a week Home exercise program with mild benefit, she tries to do 10 to 20 minutes of walking Daily stretching   Prior medications She is currently on Cymbalta She takes gabapentin 300 mg.  She only takes at night due to sedation.  She not sure if it is helping PCP has recently started sertraline Tylenol and Advil do not help     Interval history 01/09/2022 Charlene Griffin is here for follow-up regarding her chronic pain.  She continues to have pain mostly in her back and neck.  She reports that the Lyrica has decreased her overall pain significantly.  She has noted some weight gain.  She is uncertain if this is due to increased appetite from the Lyrica or due to a decrease in vomiting episodes.  Previously she reports pain had caused her to have vomiting however this is not occurring as frequently.  She also feels like her jaw is more tight since using the medication.  She has an appointment with dentistry for possible new mouthguard at night.  She reports having fewer tremors in her right lower extremity however when they occur they last longer.  She is not attempting to get pregnant at this time.  She has been attempting to exercise by doing walks and using a stationary bike.  She does  Plan to increase her activity  further.  She reports TENS unit when she uses this during a physical therapy session.   Interval history 03/17/2022 Charlene Griffin is here for follow-up of her chronic pain.  She reports pain has been worse since her last visit.  She reports that while the first month or 2 she felt the Lyrica was working, her pain is now back to prior baseline.  She also feels that Lyrica is causing weight gain.  She has worsening tremors in her right lower extremity that occur whenever she extends her knee and flex her hip.  She also reports during therapy stretching of her piriformis on the right will cause shooting pain down her leg.   She continues to have back and neck spasms as well.  Home TENS unit has been helping her pain.  She is very frustrated and having difficulty working due to her pain.  Interval history 05/11/22 Charlene Griffin is here for f/u visit.  She had an evaluation by Neurology . MRI C spine, T spine, EEG, EMG/NCS RLE completed without explanation of RLE temors. She says neurology feels that this may be anxiety related due to negative workup.  TENS and Lyrica continue to help her pain. She is also using duloxetine. She says she missed her rheumatology visit and was unable to reschedule.   Pain Inventory Average Pain 9 Pain Right Now 8 My pain is constant, sharp, and stabbing  In the last 24 hours, has pain interfered with the following? General activity 9 Relation with others 9 Enjoyment of life 9 What TIME of day is your pain at its worst? morning , daytime, and evening Sleep (in general) Poor  Pain is worse with: walking, bending, sitting, inactivity, and standing Pain improves with:  no Relief from Meds: 1  Family History  Problem Relation Age of Onset   Colon cancer Maternal Grandfather        passed away 9   Healthy Mother    Healthy Father    Asthma Sister    Factor V Leiden deficiency Sister    Ovarian cancer Paternal Grandmother    Social History   Socioeconomic History   Marital status: Married    Spouse name: Not on file   Number of children: Not on file   Years of education: Not on file   Highest education level: Not on file  Occupational History   Not on file  Tobacco Use   Smoking status: Never   Smokeless tobacco: Never  Vaping Use   Vaping Use: Some days   Substances: Nicotine, Flavoring  Substance and Sexual Activity   Alcohol use: Yes    Comment: socially   Drug use: Never   Sexual activity: Yes    Birth control/protection: Pill  Other Topics Concern   Not on file  Social History Narrative   Not on file   Social Determinants of Health   Financial  Resource Strain: Not on file  Food Insecurity: Not on file  Transportation Needs: Not on file  Physical Activity: Not on file  Stress: Not on file  Social Connections: Not on file   Past Surgical History:  Procedure Laterality Date   HERNIA REPAIR     x 2   INCISIONAL HERNIA REPAIR N/A 09/02/2019   Procedure: LAPAROSCOPIC INCISIONAL HERNIA REPAIR WITH MESH;  Surgeon: Kinsinger, De Blanch, MD;  Location: [facility];  Service: General;  Laterality: N/A;   LAPAROSCOPY N/A 12/29/2018   Procedure: LAPAROSCOPY DIAGNOSTIC;  Surgeon: Romualdo Bolk, MD;  Location:  [facility];  Service: Gynecology;  Laterality: N/A;  1 hour surgery time/ follow Dr Rica Records case   LEFT OOPHORECTOMY     OVARIAN CYST REMOVAL Left 12/29/2018   Procedure: LAPARASCOPIC OOPHARECTOMY;  Surgeon: Romualdo Bolk, MD;  Location: [facility];  Service: Gynecology;  Laterality: Left;   WISDOM TOOTH EXTRACTION     Past Surgical History:  Procedure Laterality Date   HERNIA REPAIR     x 2   INCISIONAL HERNIA REPAIR N/A 09/02/2019   Procedure: LAPAROSCOPIC INCISIONAL HERNIA REPAIR WITH MESH;  Surgeon: Kinsinger, De Blanch, MD;  Location: [facility];  Service: General;  Laterality: N/A;   LAPAROSCOPY N/A 12/29/2018   Procedure: LAPAROSCOPY DIAGNOSTIC;  Surgeon: Romualdo Bolk, MD;  Location: [facility];  Service: Gynecology;  Laterality: N/A;  1 hour surgery time/ follow Dr Rica Records case   LEFT OOPHORECTOMY     OVARIAN CYST REMOVAL Left 12/29/2018   Procedure: LAPARASCOPIC OOPHARECTOMY;  Surgeon: Romualdo Bolk, MD;  Location: [facility];  Service: Gynecology;  Laterality: Left;   WISDOM TOOTH EXTRACTION     Past Medical History:  Diagnosis Date   ADHD    Allergy    Alopecia    Anxiety    Bronchitis    Celiac disease    Complication of anesthesia    intubation bronchitis   GERD (gastroesophageal reflux disease)    rare   Hemorrhoids     Immune deficiency disorder (HCC)    Insomnia    Major depression    Migraine with aura    Panic attack    with surgery   PCOS (polycystic ovarian syndrome)    PONV (postoperative nausea and vomiting)    Tinnitus    Umbilical hernia    BP 120/64   Pulse 66   Ht 5\' 6"  (1.676 m)   Wt 178 lb (80.7 kg)   SpO2 98%   BMI 28.73 kg/m   Opioid Risk Score:   Fall Risk Score:  `1  Depression screen PHQ 2/9     05/11/2022    3:20 PM 03/16/2022    2:51 PM 01/09/2022    9:55 AM 11/23/2021    2:06 PM 07/14/2020    3:28 PM  Depression screen PHQ 2/9  Decreased Interest 0 0 0 3 0  Down, Depressed, Hopeless 0 0 0 2 0  PHQ - 2 Score 0 0 0 5 0  Altered sleeping    3   Tired, decreased energy    3   Change in appetite    1   Feeling bad or failure about yourself     1   Trouble concentrating    0   Moving slowly or fidgety/restless    3   Suicidal thoughts    0   PHQ-9 Score    16   Difficult doing work/chores    Very difficult      Review of Systems  Constitutional: Negative.   HENT: Negative.    Eyes: Negative.   Respiratory: Negative.    Cardiovascular: Negative.   Gastrointestinal: Negative.   Endocrine: Negative.   Genitourinary: Negative.   Musculoskeletal: Negative.   Skin: Negative.   Allergic/Immunologic: Negative.   Neurological: Negative.   Hematological: Negative.   Psychiatric/Behavioral: Negative.    All other systems reviewed and are negative.      Objective:   Physical Exam  Gen: no distress, normal appearing HEENT: oral mucosa pink and moist, NCAT  Chest: normal effort, normal rate of breathing Abd: soft, non-distended Ext: no edema Psych: pleasant, normal affect Skin: intact Neuro: Awake and alert, follows commands, cranial nerves II through XII grossly intact, no focal motor deficits noted, , sensation intact in all 4 extremities Musculoskeletal: Tenderness to palpation throughout lumbar thoracic and cervical paraspinal muscles, pain throughout  periscapular muscles tenderness noted in her thighs, shoulders Spurling's negative        Head CT 06/12/21 No acute intracranial abnormality.    MRI spine 04/03/21 IMPRESSION: 1. Normal MRI appearance of the spinal cord. No cord signal changes to suggest myelopathy. No abnormal enhancement. 2. Mild degenerative disc disease at C3-4 through C6-7, with additional mild noncompressive disc bulging at L2-3. No significant stenosis or neural impingement. 3. Mild facet hypertrophy at L3-4 and L4-5.      Assessment & Plan:  Fibromyalgia -Continue duloxetine 60 mg daily -increase Lyrica 75 mg TID daily.   -She denies being pregnant and is not currently trying to get pregnant.  -She would like to avoid opioid medications -Continue zynex device- helping using it a lot  -Continue   -She has started chiropractic care   Lumbar and cervical spondylosis, history of SI joint dysfunction -These conditions may be contributing to her pain, however pain is very diffuse and I suspect it is more likely consistent with fibromyalgia -She would still like to see rheum, says she was told she cannot see rheum at [facility] since she missed visit, she asked for consult different group, [place] rheum consult placed -Zynex Cryoheat device ordered   Tremor and muscle spasms in right lower extremity -Neurology workup completed,  MRI C spine, T spine, EEG, EMG/NCS RLE without cause found, consult psychiatry as may be anxiety related   Migraine headache -Continue follow-up with neurology   Depression and anxiety -Currently on duloxetine and Zoloft, will be cautious to add additional serotonergic drug at this time,, we talked about discussing this combination with psychiatry

## 2022-05-30 ENCOUNTER — Ambulatory Visit: Admitting: Podiatry

## 2022-05-31 ENCOUNTER — Ambulatory Visit (INDEPENDENT_AMBULATORY_CARE_PROVIDER_SITE_OTHER): Admitting: Podiatry

## 2022-05-31 ENCOUNTER — Encounter: Payer: Self-pay | Admitting: Podiatry

## 2022-05-31 ENCOUNTER — Ambulatory Visit (INDEPENDENT_AMBULATORY_CARE_PROVIDER_SITE_OTHER)

## 2022-05-31 DIAGNOSIS — M722 Plantar fascial fibromatosis: Secondary | ICD-10-CM

## 2022-05-31 DIAGNOSIS — M79672 Pain in left foot: Secondary | ICD-10-CM | POA: Diagnosis not present

## 2022-05-31 DIAGNOSIS — M79671 Pain in right foot: Secondary | ICD-10-CM

## 2022-05-31 MED ORDER — PREDNISONE 10 MG PO TABS
ORAL_TABLET | ORAL | 0 refills | Status: DC
Start: 2022-05-31 — End: 2022-06-27

## 2022-05-31 MED ORDER — TRIAMCINOLONE ACETONIDE 10 MG/ML IJ SUSP
10.0000 mg | Freq: Once | INTRAMUSCULAR | Status: AC
Start: 2022-05-31 — End: 2022-05-31
  Administered 2022-05-31: 10 mg

## 2022-05-31 NOTE — Progress Notes (Signed)
Subjective:   Patient ID: Charlene Griffin, female   DOB: 32 y.o.   MRN: [id number]   HPI Patient states she has had severe pain in her arch right over left and has fibromyalgia and other issues and does have tremors in the right leg which have been present for around 18 months and she is seen for neurologist with no result resolution.  Patient does not smoke likes to be active   Review of Systems  All other systems reviewed and are negative.       Objective:  Physical Exam Vitals and nursing note reviewed.  Constitutional:      Appearance: She is well-developed.  Pulmonary:     Effort: Pulmonary effort is normal.  Musculoskeletal:        General: Normal range of motion.  Skin:    General: Skin is warm.  Neurological:     Mental Status: She is alert.     Neurovascular status intact with patient found to have exquisite discomfort mid arch area right with inflammation mild in the left not to the same degree with tremors that she was able to duplicate right leg and her leg over the side of the chair with a spasm which occurs.  Patient has good digital perfusion well-oriented x 3 F2 appears to be some form of inflammatory condition with the possibility for some kind of spastic issue that not been identifiable     Assessment:  Above goes over the assessment     Plan:  Reviewed condition at this point I am going to try to treat the acute inflammation and see if we can reduce spasm with immobilization.  I did sterile prep injected the mid arch right 3 mg Kenalog 5 mg Xylocaine I applied a air fracture walker to immobilize which she states seems to make things feel better and I placed her on a 12-day Sterapred DS Dosepak.  Reappoint to recheck again in 3 weeks or earlier if issues occur and may require treatment for the left  X-rays bilateral negative for signs of a bone issue with moderate depression of the arch no other pathology noted

## 2022-06-08 ENCOUNTER — Other Ambulatory Visit: Payer: Self-pay | Admitting: Podiatry

## 2022-06-08 DIAGNOSIS — M722 Plantar fascial fibromatosis: Secondary | ICD-10-CM

## 2022-06-08 DIAGNOSIS — M79671 Pain in right foot: Secondary | ICD-10-CM

## 2022-06-13 ENCOUNTER — Other Ambulatory Visit: Payer: Self-pay

## 2022-06-13 ENCOUNTER — Encounter (HOSPITAL_BASED_OUTPATIENT_CLINIC_OR_DEPARTMENT_OTHER): Payer: Self-pay

## 2022-06-13 ENCOUNTER — Emergency Department (HOSPITAL_BASED_OUTPATIENT_CLINIC_OR_DEPARTMENT_OTHER)
Admission: EM | Admit: 2022-06-13 | Discharge: 2022-06-13 | Disposition: A | Discharge status: Home / Self Care | Attending: Emergency Medicine | Admitting: Emergency Medicine

## 2022-06-13 DIAGNOSIS — H814 Vertigo of central origin: Secondary | ICD-10-CM | POA: Insufficient documentation

## 2022-06-13 DIAGNOSIS — H8190 Unspecified disorder of vestibular function, unspecified ear: Secondary | ICD-10-CM

## 2022-06-13 DIAGNOSIS — R42 Dizziness and giddiness: Secondary | ICD-10-CM | POA: Diagnosis present

## 2022-06-13 LAB — CBC WITH DIFFERENTIAL/PLATELET
Abs Immature Granulocytes: 0.03 10*3/uL (ref 0.00–0.07)
Basophils Absolute: 0 10*3/uL (ref 0.0–0.1)
Basophils Relative: 0 %
Eosinophils Absolute: 0.2 10*3/uL (ref 0.0–0.5)
Eosinophils Relative: 3 %
HCT: 44 % (ref 36.0–46.0)
Hemoglobin: 15 g/dL (ref 12.0–15.0)
Immature Granulocytes: 0 %
Lymphocytes Relative: 28 %
Lymphs Abs: 2.2 10*3/uL (ref 0.7–4.0)
MCH: 30.8 pg (ref 26.0–34.0)
MCHC: 34.1 g/dL (ref 30.0–36.0)
MCV: 90.3 fL (ref 80.0–100.0)
Monocytes Absolute: 0.7 10*3/uL (ref 0.1–1.0)
Monocytes Relative: 9 %
Neutro Abs: 4.7 10*3/uL (ref 1.7–7.7)
Neutrophils Relative %: 60 %
Platelets: 324 10*3/uL (ref 150–400)
RBC: 4.87 MIL/uL (ref 3.87–5.11)
RDW: 13.2 % (ref 11.5–15.5)
WBC: 7.7 10*3/uL (ref 4.0–10.5)
nRBC: 0 % (ref 0.0–0.2)

## 2022-06-13 LAB — HCG, SERUM, QUALITATIVE: Preg, Serum: NEGATIVE

## 2022-06-13 LAB — BASIC METABOLIC PANEL
Anion gap: 7 (ref 5–15)
BUN: 14 mg/dL (ref 6–20)
CO2: 29 mmol/L (ref 22–32)
Calcium: 9.7 mg/dL (ref 8.9–10.3)
Chloride: 99 mmol/L (ref 98–111)
Creatinine, Ser: 0.75 mg/dL (ref 0.44–1.00)
GFR, Estimated: >60 mL/min (ref >60)
Glucose, Bld: 106 mg/dL — ABNORMAL HIGH (ref 70–99)
Potassium: 4 mmol/L (ref 3.5–5.1)
Sodium: 135 mmol/L (ref 135–145)

## 2022-06-13 MED ORDER — SODIUM CHLORIDE 0.9 % IV BOLUS
1000.0000 mL | Freq: Once | INTRAVENOUS | Status: AC
  Administered 2022-06-13: 1000 mL via INTRAVENOUS

## 2022-06-13 MED ORDER — PROCHLORPERAZINE EDISYLATE 10 MG/2ML IJ SOLN
10.0000 mg | Freq: Once | INTRAMUSCULAR | Status: AC
  Administered 2022-06-13: 10 mg via INTRAVENOUS
  Filled 2022-06-13: qty 2

## 2022-06-13 NOTE — ED Provider Notes (Signed)
EMERGENCY DEPARTMENT AT [facility] Provider Note   CSN: [id number] Arrival date & time: 06/13/22  0845     History  Chief Complaint  Patient presents with   Tinnitus    Charlene Griffin is a 32 y.o. female.  32 yo F with a chief complaints of recurrent dizziness.  This is a problem for her for some time now.  She is worried because for the past 3 days she has had recurrence of her symptoms almost exclusively when she is driving.  She never feels like it is aggravated by certain head movements as it happens spontaneously.  Seems to resolve with time.  Occurred while she was driving today and then has improved by the time of her evaluation here.  She is having a headache with that as well.  The headache feels like her typical migraine but she does not typically have vertiginous symptoms with her migraines.  She is not on any migraine medicines currently and she thinks it is due to success of physical therapy.  She has been worked up for her dizziness and tinnitus in the past, has seen ENT has had an MRI.  She had an episode yesterday where she also felt she might pass out.  Felt her vision started narrowing she did not actually pass out and things improved with time.        Home Medications Prior to Admission medications   Medication Sig Start Date End Date Taking? Authorizing Provider  albuterol (PROVENTIL) (2.5 MG/3ML) 0.083% nebulizer solution Inhale into the lungs as needed. 01/19/20   [provider]  clobetasol ointment (TEMOVATE) 0.05 %  03/02/22   [provider]  dicyclomine (BENTYL) 20 MG tablet Take 20 mg by mouth every 6 (six) hours.    [provider]  DULoxetine (CYMBALTA) 60 MG capsule Take 60 mg by mouth daily.    [provider]  ibuprofen (ADVIL) 600 MG tablet TAKE 1 TABLET BY MOUTH EVERY 4 TO 6 HOURS    [provider]  lamoTRIgine (LAMICTAL) 100 MG tablet Take 1 tablet (100 mg total) by mouth at  bedtime. 10/25/21   Lomax, Amy, NP  Meloxicam 5 MG CAPS Meloxicam    [provider]  montelukast (SINGULAIR) 10 MG tablet Take 1 tablet (10 mg total) by mouth at bedtime. 11/08/20   Theadora Rama Scales, PA-C  norethindrone (MICRONOR) 0.35 MG tablet Take 1 tablet by mouth daily.    [provider]  predniSONE (DELTASONE) 10 MG tablet 12 day tapering dose 05/31/22   Lenn Sink, DPM  pregabalin (LYRICA) 75 MG capsule Take 1 capsule (75 mg total) by mouth 3 (three) times daily. 05/11/22   Fanny Dance, MD  Rimegepant Sulfate (NURTEC) 75 MG TBDP Take 1 tab at onset of migraine.  May repeat in 2 hrs, if needed.  Max dose: 2 tabs/day. This is a 30 day prescription. 07/03/21   Levert Feinstein, MD  sertraline (ZOLOFT) 25 MG tablet Take 25 mg by mouth at bedtime. 11/16/21 03/16/22  [provider]  tiZANidine (ZANAFLEX) 2 MG tablet Take 1 tablet (2 mg total) by mouth every 8 (eight) hours as needed for muscle spasms. 03/19/22   Fanny Dance, MD      Allergies    Botox [botulinum toxin type a], Diphenhydramine, Gluten meal, and Onabotulinumtoxina (cosmetic)    Review of Systems   Review of Systems  Physical Exam Updated Vital Signs BP 115/84   Pulse 72  Temp 97.8 F (36.6 C) (Oral)   Resp 17   Ht 5\' 6"  (1.676 m)   Wt 81.6 kg   LMP 06/05/2022   SpO2 100%   BMI 29.05 kg/m  Physical Exam Vitals and nursing note reviewed.  Constitutional:      General: She is not in acute distress.    Appearance: She is well-developed. She is not diaphoretic.  HENT:     Head: Normocephalic and atraumatic.  Eyes:     Pupils: Pupils are equal, round, and reactive to light.  Cardiovascular:     Rate and Rhythm: Normal rate and regular rhythm.     Heart sounds: No murmur heard.    No friction rub. No gallop.  Pulmonary:     Effort: Pulmonary effort is normal.     Breath sounds: No wheezing or rales.  Abdominal:     General: There is no distension.     Palpations: Abdomen  is soft.     Tenderness: There is no abdominal tenderness.  Musculoskeletal:        General: No tenderness.     Cervical back: Normal range of motion and neck supple.  Skin:    General: Skin is warm and dry.  Neurological:     Mental Status: She is alert and oriented to person, place, and time.     Cranial Nerves: Cranial nerves 2-12 are intact.     Sensory: Sensation is intact.     Motor: Motor function is intact.     Coordination: Coordination is intact.     Comments: Some spasm to the right lower leg after hip flexion.  Left-sided fast going nystagmus.  Otherwise benign neurologic exam.  Psychiatric:        Behavior: Behavior normal.     ED Results / Procedures / Treatments   Labs (all labs ordered are listed, but only abnormal results are displayed) Labs Reviewed  BASIC METABOLIC PANEL - Abnormal; Notable for the following components:      Result Value   Glucose, Bld 106 (*)    All other components within normal limits  CBC WITH DIFFERENTIAL/PLATELET  HCG, SERUM, QUALITATIVE    EKG EKG Interpretation  Date/Time:  Wednesday June 13 2022 10:05:50 EDT Ventricular Rate:  64 PR Interval:  146 QRS Duration: 94 QT Interval:  409 QTC Calculation: 422 R Axis:   84 Text Interpretation: Sinus rhythm No significant change since last tracing Confirmed by Melene Plan [phone]) on 06/13/2022 10:29:29 AM  Radiology No results found.  Procedures .1-3 Lead EKG Interpretation  Performed by: Melene Plan, DO Authorized by: Melene Plan, DO     Interpretation: normal     ECG rate:  79   ECG rate assessment: normal     Rhythm: sinus rhythm     Ectopy: none     Conduction: normal       Medications Ordered in ED Medications  sodium chloride 0.9 % bolus 1,000 mL (1,000 mLs Intravenous New Bag/Given 06/13/22 1012)  prochlorperazine (COMPAZINE) injection 10 mg (10 mg Intravenous Given 06/13/22 1016)    ED Course/ Medical Decision Making/ A&P                             Medical  Decision Making Amount and/or Complexity of Data Reviewed Labs: ordered. ECG/medicine tests: ordered.  Risk Prescription drug management.   32 yo F with a chief complaints of recurrent tinnitus, she describes this as a dizzy  sensation and not necessarily ringing in your ears.  Tells me she has had this for quite some time but has been much more persistent over the past couple days.  Has had it occur each day for 3 days in a row.  She was recently on steroids for foot pain.  She otherwise denies any recent medication changes.  Denies head injury denies neck pain.  Will treat here with a headache cocktail.  Bolus of IV fluids lab work for evaluation of her near syncopal episode last night.  Patient is feeling bit better on repeat assessment.  She would like to go home at this time.  However follow-up with her neurologist.  Lab work here without anemia.  No electrolyte abnormality, pregnancy test negative. EKG without ectopy.   11:02 AM:  I have discussed the diagnosis/risks/treatment options with the patient and family.  Evaluation and diagnostic testing in the emergency department does not suggest an emergent condition requiring admission or immediate intervention beyond what has been performed at this time.  They will follow up with PCP. We also discussed returning to the ED immediately if new or worsening sx occur. We discussed the sx which are most concerning (e.g., sudden worsening pain, fever, inability to tolerate by mouth) that necessitate immediate return. Medications administered to the patient during their visit and any new prescriptions provided to the patient are listed below.  Medications given during this visit Medications  sodium chloride 0.9 % bolus 1,000 mL (1,000 mLs Intravenous New Bag/Given 06/13/22 1012)  prochlorperazine (COMPAZINE) injection 10 mg (10 mg Intravenous Given 06/13/22 1016)     The patient appears reasonably screen and/or stabilized for discharge and I doubt any  other medical condition or other [facility] requiring further screening, evaluation, or treatment in the ED at this time prior to discharge.            Final Clinical Impression(s) / ED Diagnoses Final diagnoses:  Episodic recurrent vertigo    Rx / DC Orders ED Discharge Orders     None         Melene Plan, DO 06/13/22 1102

## 2022-06-13 NOTE — ED Triage Notes (Signed)
"  Have had issues with headaches, dizziness and tinnitus for years. Have had recent MRI of head and neck and past EEG and multiple test. Only diagnosis is tinnitus. Sunday it flared up. I was driving and got the tunnel vision and high pitched ringing and pain. Seen at urgent care and given antivert. Told to take for 3 days and return if symptoms didn't improve. Today is day 3 and I am still having the episodes and headache has increased" per pt

## 2022-06-13 NOTE — Discharge Instructions (Signed)
Hopefully this medication resets your symptoms.  Please follow-up with your neurologist in the office.  Please return for worsening or persistent symptoms or one-sided numbness or weakness or difficulty speech or swallowing.

## 2022-06-13 NOTE — ED Triage Notes (Signed)
Pt reports episode of tinnitus since Monday. Causing her to off balance.

## 2022-06-27 ENCOUNTER — Encounter (HOSPITAL_COMMUNITY): Payer: Self-pay | Admitting: Psychiatry

## 2022-06-27 ENCOUNTER — Telehealth (HOSPITAL_COMMUNITY): Payer: Self-pay | Admitting: *Deleted

## 2022-06-27 ENCOUNTER — Ambulatory Visit (HOSPITAL_BASED_OUTPATIENT_CLINIC_OR_DEPARTMENT_OTHER): Admitting: Psychiatry

## 2022-06-27 VITALS — Wt 181.0 lb

## 2022-06-27 DIAGNOSIS — F902 Attention-deficit hyperactivity disorder, combined type: Secondary | ICD-10-CM | POA: Diagnosis not present

## 2022-06-27 DIAGNOSIS — F4310 Post-traumatic stress disorder, unspecified: Secondary | ICD-10-CM

## 2022-06-27 DIAGNOSIS — F331 Major depressive disorder, recurrent, moderate: Secondary | ICD-10-CM
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
## 2022-06-28 ENCOUNTER — Telehealth (HOSPITAL_COMMUNITY): Payer: Self-pay | Admitting: *Deleted

## 2022-06-28 NOTE — Telephone Encounter (Signed)
Referrals for ADHD testing sent to [facility] and [facility]. LVM for pt advising and also giving names of local EMDR therapists. Pt encouraged to call us back with any questions or concerns.

## 2022-06-29 ENCOUNTER — Ambulatory Visit (INDEPENDENT_AMBULATORY_CARE_PROVIDER_SITE_OTHER): Admitting: Podiatry

## 2022-06-29 ENCOUNTER — Encounter: Payer: Self-pay | Admitting: Podiatry

## 2022-06-29 DIAGNOSIS — M722 Plantar fascial fibromatosis: Secondary | ICD-10-CM | POA: Diagnosis not present

## 2022-06-30 NOTE — Progress Notes (Signed)
Subjective:   Patient ID: Charlene Griffin, female   DOB: 32 y.o.   MRN: [id number]   HPI Patient states the pain is really bad when she is barefoot but nowhere near as bad when she wear shoes.  She still gets the spasms in her right leg and in her right foot and feels like if she could stretch it better that that might help the condition.  States the boot has been okay hard to wear all the time neuro   ROS      Objective:  Physical Exam  Vascular status intact with me being able to observe the unusual spasms in her right calf muscle and in her foot with discomfort in the mid arch but improved from previous     Assessment:  Inflammatory fasciitis right with some unknown condition causing spasms with patient having seen numerous doctors and due to see a new neurologist     Plan:  H&P reviewed the continued utilization of the boot but at this time I dispensed a soft pliable off-the-shelf prefabricated night splint to try to hold the foot at 90 degrees while sitting and hopefully help prevent some of these spasms that are occurring

## 2022-07-06 NOTE — Telephone Encounter (Signed)
Erroneous encounter

## 2022-07-13 ENCOUNTER — Encounter: Attending: Physical Medicine & Rehabilitation | Admitting: Physical Medicine & Rehabilitation

## 2022-07-13 ENCOUNTER — Encounter: Payer: Self-pay | Admitting: Physical Medicine & Rehabilitation

## 2022-07-13 VITALS — BP 112/70 | HR 79 | Ht 66.0 in | Wt 178.8 lb

## 2022-07-13 DIAGNOSIS — M4306 Spondylolysis, lumbar region: Secondary | ICD-10-CM | POA: Diagnosis present

## 2022-07-13 DIAGNOSIS — M47812 Spondylosis without myelopathy or radiculopathy, cervical region: Secondary | ICD-10-CM | POA: Insufficient documentation

## 2022-07-13 DIAGNOSIS — F32A Depression, unspecified: Secondary | ICD-10-CM | POA: Diagnosis present

## 2022-07-13 DIAGNOSIS — M797 Fibromyalgia: Secondary | ICD-10-CM | POA: Diagnosis present

## 2022-07-13 DIAGNOSIS — M62838 Other muscle spasm: Secondary | ICD-10-CM | POA: Diagnosis present

## 2022-07-13 NOTE — Progress Notes (Signed)
Subjective:    Patient ID: Charlene Griffin, female    DOB: Sep 15, 1990, 32 y.o.   MRN: [id number]  HPI   HPI Charlene Griffin is a 32 y.o. year old female  who  has a past medical history of ADHD, Allergy, Alopecia, Anxiety, Bronchitis, Celiac disease, Complication of anesthesia, GERD (gastroesophageal reflux disease), Hemorrhoids, Immune deficiency disorder (HCC), Insomnia, Major depression, Migraine with aura, Panic attack, PCOS (polycystic ovarian syndrome), PONV (postoperative nausea and vomiting), Tinnitus, and Umbilical hernia.   She has been diagnosed with fibromyalgia.  They are presenting to PM&R clinic as a new patient for pain management evaluation.  She reports she has had migraines for many years.  About 2 years ago she developed pain in her lower back and hips.  Pain is sometimes worse on the left and other times worse on the right.  Pain is worsened by any type of activity.  She is unable to work consistently due to her chronic pain.  Pain is worse when the weather changes.  She is also developed a tremor that is worse in her right lower extremity.  Tremor is worse when pain is most severe.  She reports she has had this since the beginning of the year.  She reports a addiction history in her family and would like to avoid opioid medications.  She reports poor sleep.  Chronic anxiety and depression.  No SI or HI.  Reports a lot of stress due to her pain and not being able to work.  Prior constipation but this improved after she stopped eating gluten. She is currently on birth control and not try to get pregnant.  She may consider getting pregnant in the future.  She reports she felt the best when she was in a warm dry climate.  She reports the pain is so severe it will cause her to vomit.  She reports muscle cramping in her legs.  Denies bowel or bladder incontinence.     Prior treatments Massage helps Dry needling helps SI joint belts help previously SI joint injections and   ESI injections by Dr. Peggye Pitt She has been doing PT twice a week Home exercise program with mild benefit, she tries to do 10 to 20 minutes of walking Daily stretching   Prior medications She is currently on Cymbalta She takes gabapentin 300 mg.  She only takes at night due to sedation.  She not sure if it is helping PCP has recently started sertraline Tylenol and Advil do not help     Interval history 01/09/2022 Charlene Griffin is here for follow-up regarding her chronic pain.  She continues to have pain mostly in her back and neck.  She reports that the Lyrica has decreased her overall pain significantly.  She has noted some weight gain.  She is uncertain if this is due to increased appetite from the Lyrica or due to a decrease in vomiting episodes.  Previously she reports pain had caused her to have vomiting however this is not occurring as frequently.  She also feels like her jaw is more tight since using the medication.  She has an appointment with dentistry for possible new mouthguard at night.  She reports having fewer tremors in her right lower extremity however when they occur they last longer.  She is not attempting to get pregnant at this time.  She has been attempting to exercise by doing walks and using a stationary bike.  She does  Plan to increase her  activity further.  She reports TENS unit when she uses this during a physical therapy session.   Interval history 03/17/2022 Charlene Griffin is here for follow-up of her chronic pain.  She reports pain has been worse since her last visit.  She reports that while the first month or 2 she felt the Lyrica was working, her pain is now back to prior baseline.  She also feels that Lyrica is causing weight gain.  She has worsening tremors in her right lower extremity that occur whenever she extends her knee and flex her hip.  She also reports during therapy stretching of her piriformis on the right will cause shooting pain down her leg.   She continues to have back and neck spasms as well.  Home TENS unit has been helping her pain.  She is very frustrated and having difficulty working due to her pain.   Interval history 05/11/22 Charlene Griffin is here for f/u visit.  She had an evaluation by Neurology . MRI C spine, T spine, EEG, EMG/NCS RLE completed without explanation of RLE temors. She says neurology feels that this may be anxiety related due to negative workup.  TENS and Lyrica continue to help her pain. She is also using duloxetine. She says she missed her rheumatology visit and was unable to reschedule.   Interval history 07/16/22 Charlene Griffin is here for follow-up regarding her chronic pain.  Overall pain has been largely unchanged from prior visit.  She has also been treated for inflammatory fasciitis on the right by podiatry.  She has been provided a night splint to help treat this condition.  She is following with psychiatry and reports she has been approved for TMS.  She also may be following up with a different neurologist for reevaluation.     Pain Inventory Average Pain 8 Pain Right Now 6 My pain is intermittent, constant, sharp, stabbing, and aching  In the last 24 hours, has pain interfered with the following? General activity 8 Relation with others 8 Enjoyment of life 9 What TIME of day is your pain at its worst? morning , evening, and night Sleep (in general) Poor  Pain is worse with: walking, bending, inactivity, standing, and some activites Pain improves with: rest, heat/ice, and TENS Relief from Meds: 2  Family History  Problem Relation Age of Onset   Colon cancer Maternal Grandfather        passed away 2   Healthy Mother    Healthy Father    Asthma Sister    Factor V Leiden deficiency Sister    Ovarian cancer Paternal Grandmother    Social History   Socioeconomic History   Marital status: Married    Spouse name: Not on file   Number of children: Not on file   Years of education: Not  on file   Highest education level: Not on file  Occupational History   Not on file  Tobacco Use   Smoking status: Never   Smokeless tobacco: Never  Vaping Use   Vaping Use: Some days   Substances: Nicotine, Flavoring  Substance and Sexual Activity   Alcohol use: Yes    Comment: socially   Drug use: Never   Sexual activity: Yes    Birth control/protection: Pill  Other Topics Concern   Not on file  Social History Narrative   Not on file   Social Determinants of Health   Financial Resource Strain: Not on file  Food Insecurity: Not on file  Transportation Needs: Not  on file  Physical Activity: Not on file  Stress: Not on file  Social Connections: Not on file   Past Surgical History:  Procedure Laterality Date   HERNIA REPAIR     x 2   INCISIONAL HERNIA REPAIR N/A 09/02/2019   Procedure: LAPAROSCOPIC INCISIONAL HERNIA REPAIR WITH MESH;  Surgeon: Kinsinger, De Blanch, MD;  Location: [facility];  Service: General;  Laterality: N/A;   LAPAROSCOPY N/A 12/29/2018   Procedure: LAPAROSCOPY DIAGNOSTIC;  Surgeon: Romualdo Bolk, MD;  Location: [facility];  Service: Gynecology;  Laterality: N/A;  1 hour surgery time/ follow Dr Rica Records case   LEFT OOPHORECTOMY     OVARIAN CYST REMOVAL Left 12/29/2018   Procedure: LAPARASCOPIC OOPHARECTOMY;  Surgeon: Romualdo Bolk, MD;  Location: [facility];  Service: Gynecology;  Laterality: Left;   WISDOM TOOTH EXTRACTION     Past Surgical History:  Procedure Laterality Date   HERNIA REPAIR     x 2   INCISIONAL HERNIA REPAIR N/A 09/02/2019   Procedure: LAPAROSCOPIC INCISIONAL HERNIA REPAIR WITH MESH;  Surgeon: Kinsinger, De Blanch, MD;  Location: [facility];  Service: General;  Laterality: N/A;   LAPAROSCOPY N/A 12/29/2018   Procedure: LAPAROSCOPY DIAGNOSTIC;  Surgeon: Romualdo Bolk, MD;  Location: [facility];  Service: Gynecology;  Laterality: N/A;  1 hour surgery time/ follow Dr Rica Records  case   LEFT OOPHORECTOMY     OVARIAN CYST REMOVAL Left 12/29/2018   Procedure: LAPARASCOPIC OOPHARECTOMY;  Surgeon: Romualdo Bolk, MD;  Location: [facility];  Service: Gynecology;  Laterality: Left;   WISDOM TOOTH EXTRACTION     Past Medical History:  Diagnosis Date   ADHD    Allergy    Alopecia    Anxiety    Bronchitis    Celiac disease    Complication of anesthesia    intubation bronchitis   GERD (gastroesophageal reflux disease)    rare   Hemorrhoids    Immune deficiency disorder (HCC)    Insomnia    Major depression    Migraine with aura    Panic attack    with surgery   PCOS (polycystic ovarian syndrome)    PONV (postoperative nausea and vomiting)    Tinnitus    Umbilical hernia    BP 112/70   Pulse 79   Ht 5\' 6"  (1.676 m)   Wt 178 lb 12.8 oz (81.1 kg)   LMP 06/05/2022   SpO2 98%   BMI 28.86 kg/m   Opioid Risk Score:   Fall Risk Score:  `1  Depression screen [facility] 2/9     07/13/2022    3:43 PM 06/27/2022   10:05 AM 05/11/2022    3:20 PM 03/16/2022    2:51 PM 01/09/2022    9:55 AM 11/23/2021    2:06 PM 07/14/2020    3:28 PM  Depression screen PHQ 2/9  Decreased Interest 0  0 0 0 3 0  Down, Depressed, Hopeless 0  0 0 0 2 0  PHQ - 2 Score 0  0 0 0 5 0  Altered sleeping      3   Tired, decreased energy      3   Change in appetite      1   Feeling bad or failure about yourself       1   Trouble concentrating      0   Moving slowly or fidgety/restless      3  Suicidal thoughts      0   PHQ-9 Score      16   Difficult doing work/chores      Very difficult      Information is confidential and restricted. Go to Review Flowsheets to unlock data.     Review of Systems  Constitutional: Negative.   HENT: Negative.    Eyes: Negative.   Respiratory: Negative.    Cardiovascular: Negative.   Gastrointestinal: Negative.   Endocrine: Negative.   Genitourinary: Negative.   Musculoskeletal:  Positive for arthralgias, back pain, myalgias and  neck pain.       Bil hip and shoulder pain  Skin: Negative.   Allergic/Immunologic: Negative.   Neurological:  Positive for headaches.  Hematological: Negative.   Psychiatric/Behavioral: Negative.    All other systems reviewed and are negative.      Objective:   Physical Exam  Gen: no distress, normal appearing HEENT: oral mucosa pink and moist, NCAT Chest: normal effort, normal rate of breathing Abd: soft, non-distended Ext: no edema Psych: pleasant, normal affect Skin: intact Neuro: Awake and alert, follows commands, cranial nerves II through XII grossly intact, no focal motor deficits noted, , sensation intact in all 4 extremities Musculoskeletal: Tenderness to palpation throughout lumbar thoracic and cervical paraspinal muscles, pain throughout periscapular muscles tenderness noted in her thighs, shoulders, elbows, knees          Head CT 06/12/21 No acute intracranial abnormality.    MRI spine 04/03/21 IMPRESSION: 1. Normal MRI appearance of the spinal cord. No cord signal changes to suggest myelopathy. No abnormal enhancement. 2. Mild degenerative disc disease at C3-4 through C6-7, with additional mild noncompressive disc bulging at L2-3. No significant stenosis or neural impingement. 3. Mild facet hypertrophy at L3-4 and L4-5.      Assessment & Plan:   Fibromyalgia -Continue duloxetine 60 mg daily -Increase Lyrica 100mg  TID,  -She denies being pregnant and is not currently trying to get pregnant.  -She would like to avoid opioid medications -Continue zynex device-has been providing benefit -She has started chiropractic care   Lumbar and cervical spondylosis, history of SI joint dysfunction -These conditions may be contributing to her pain, however pain is very diffuse and I suspect it is more likely consistent with fibromyalgia -I did not assess this visit if she had seen rheumatology, will check with her regarding this next visit -Zynex Cryoheat device  ordered   Tremor and muscle spasms in right lower extremity -Neurology workup completed,  MRI C spine, T spine, EEG, EMG/NCS RLE without cause found, consult psychiatry as may be anxiety related -It sounds like this is getting a little better   Migraine headache -Continue follow-up with neurology   Depression and anxiety -She is currently following with psychiatry Approved for TMS

## 2022-07-16 ENCOUNTER — Encounter: Payer: Self-pay | Admitting: Physical Medicine & Rehabilitation

## 2022-07-16 MED ORDER — PREGABALIN 100 MG PO CAPS: 100.0000 mg | ORAL_CAPSULE | Freq: Three times a day (TID) | ORAL | 5 refills | Status: DC

## 2022-07-23 ENCOUNTER — Telehealth: Payer: Self-pay | Admitting: Podiatry

## 2022-07-23 NOTE — Telephone Encounter (Signed)
Charlene Griffin called.  She was in [place] over the weekend and her feet started to hurt again, the right worse than the left.  They were swelling a lot, too.  Since returning, and going back to wearing the boot, the swelling has improved.    She wants to know what she should do and should she continue wearing the boot?  Call back #[phone]

## 2022-07-24 NOTE — Telephone Encounter (Signed)
She should continue boot for period of time and if not improved should come in

## 2022-07-26 ENCOUNTER — Encounter (HOSPITAL_COMMUNITY): Payer: Self-pay | Admitting: Psychiatry

## 2022-07-26 ENCOUNTER — Telehealth: Payer: Self-pay | Admitting: Physical Medicine & Rehabilitation

## 2022-07-26 ENCOUNTER — Telehealth (HOSPITAL_COMMUNITY): Payer: Self-pay

## 2022-07-26 ENCOUNTER — Ambulatory Visit (HOSPITAL_BASED_OUTPATIENT_CLINIC_OR_DEPARTMENT_OTHER): Admitting: Psychiatry

## 2022-07-26 VITALS — BP 126/74 | HR 76 | Ht 66.0 in | Wt 180.0 lb

## 2022-07-26 DIAGNOSIS — F902 Attention-deficit hyperactivity disorder, combined type: Secondary | ICD-10-CM | POA: Diagnosis not present

## 2022-07-26 DIAGNOSIS — F4310 Post-traumatic stress disorder, unspecified: Secondary | ICD-10-CM

## 2022-07-26 DIAGNOSIS — F331 Major depressive disorder, recurrent, moderate: Secondary | ICD-10-CM

## 2022-07-26 MED ORDER — LISDEXAMFETAMINE DIMESYLATE 20 MG PO CAPS
20.0000 mg | ORAL_CAPSULE | Freq: Every day | ORAL | 0 refills | Status: DC
Start: 2022-07-26 — End: 2022-12-14
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
## 2022-07-26 NOTE — Telephone Encounter (Signed)
No we can wait until she finished the TMS.  Once she finished the TMS she can call us to schedule appointment.

## 2022-07-26 NOTE — Telephone Encounter (Signed)
Patient called in and states she is going to start a TMS treatment and her psychiatrist advised that her medication should not be changed and is requesting refill on Lyrica and would like it sent to [facility] on file

## 2022-07-26 NOTE — Telephone Encounter (Signed)
Patient is calling to let you know that she can not start the Vyvanse until after her TMS treatment is done - she wants to know if you still want her to come back in a month since she will not be starting the medication until after she completes TMS. Please review and advise, thank you

## 2022-07-27 NOTE — Telephone Encounter (Signed)
R/t call and left msg for patient to contact her pharmacy. If this does not resolve reason for her call please return call.

## 2022-07-30 ENCOUNTER — Telehealth (HOSPITAL_COMMUNITY): Payer: Self-pay | Admitting: *Deleted

## 2022-07-30 NOTE — Telephone Encounter (Signed)
PA for Vyvanse 20 mg capsules submitted to pt insurance vis [place]eds [place] portal for future use. Awaiting determination.

## 2022-09-05 ENCOUNTER — Telehealth (HOSPITAL_COMMUNITY): Admitting: Psychiatry

## 2022-10-12 ENCOUNTER — Encounter: Attending: Physical Medicine & Rehabilitation | Admitting: Physical Medicine & Rehabilitation

## 2022-10-12 ENCOUNTER — Encounter: Payer: Self-pay | Admitting: Physical Medicine & Rehabilitation

## 2022-10-12 VITALS — BP 121/82 | HR 79 | Wt 177.0 lb

## 2022-10-12 DIAGNOSIS — M4306 Spondylolysis, lumbar region: Secondary | ICD-10-CM | POA: Insufficient documentation

## 2022-10-12 DIAGNOSIS — M797 Fibromyalgia: Secondary | ICD-10-CM | POA: Diagnosis present

## 2022-10-12 DIAGNOSIS — M47812 Spondylosis without myelopathy or radiculopathy, cervical region: Secondary | ICD-10-CM | POA: Insufficient documentation

## 2022-10-12 DIAGNOSIS — F32A Depression, unspecified: Secondary | ICD-10-CM | POA: Insufficient documentation

## 2022-10-12 DIAGNOSIS — M62838 Other muscle spasm: Secondary | ICD-10-CM | POA: Insufficient documentation

## 2022-10-12 NOTE — Progress Notes (Signed)
Subjective:    Patient ID: Charlene Griffin, female    DOB: May 10, 1990, 32 y.o.   MRN: [id number]  HPI    HPI Charlene Griffin is a 32 y.o. year old female  who  has a past medical history of ADHD, Allergy, Alopecia, Anxiety, Bronchitis, Celiac disease, Complication of anesthesia, GERD (gastroesophageal reflux disease), Hemorrhoids, Immune deficiency disorder (HCC), Insomnia, Major depression, Migraine with aura, Panic attack, PCOS (polycystic ovarian syndrome), PONV (postoperative nausea and vomiting), Tinnitus, and Umbilical hernia.   She has been diagnosed with fibromyalgia.  They are presenting to PM&R clinic as a new patient for pain management evaluation.  She reports she has had migraines for many years.  About 2 years ago she developed pain in her lower back and hips.  Pain is sometimes worse on the left and other times worse on the right.  Pain is worsened by any type of activity.  She is unable to work consistently due to her chronic pain.  Pain is worse when the weather changes.  She is also developed a tremor that is worse in her right lower extremity.  Tremor is worse when pain is most severe.  She reports she has had this since the beginning of the year.  She reports a addiction history in her family and would like to avoid opioid medications.  She reports poor sleep.  Chronic anxiety and depression.  No SI or HI.  Reports a lot of stress due to her pain and not being able to work.  Prior constipation but this improved after she stopped eating gluten. She is currently on birth control and not try to get pregnant.  She may consider getting pregnant in the future.  She reports she felt the best when she was in a warm dry climate.  She reports the pain is so severe it will cause her to vomit.  She reports muscle cramping in her legs.  Denies bowel or bladder incontinence.     Prior treatments Massage helps Dry needling helps SI joint belts help previously SI joint injections and   ESI injections by Dr. Peggye Pitt She has been doing PT twice a week Home exercise program with mild benefit, she tries to do 10 to 20 minutes of walking Daily stretching   Prior medications She is currently on Cymbalta She takes gabapentin 300 mg.  She only takes at night due to sedation.  She not sure if it is helping PCP has recently started sertraline Tylenol and Advil do not help     Interval history 01/09/2022 Charlene Griffin is here for follow-up regarding her chronic pain.  She continues to have pain mostly in her back and neck.  She reports that the Lyrica has decreased her overall pain significantly.  She has noted some weight gain.  She is uncertain if this is due to increased appetite from the Lyrica or due to a decrease in vomiting episodes.  Previously she reports pain had caused her to have vomiting however this is not occurring as frequently.  She also feels like her jaw is more tight since using the medication.  She has an appointment with dentistry for possible new mouthguard at night.  She reports having fewer tremors in her right lower extremity however when they occur they last longer.  She is not attempting to get pregnant at this time.  She has been attempting to exercise by doing walks and using a stationary bike.  She does  Plan to increase  her activity further.  She reports TENS unit when she uses this during a physical therapy session.   Interval history 03/17/2022 Charlene Griffin is here for follow-up of her chronic pain.  She reports pain has been worse since her last visit.  She reports that while the first month or 2 she felt the Lyrica was working, her pain is now back to prior baseline.  She also feels that Lyrica is causing weight gain.  She has worsening tremors in her right lower extremity that occur whenever she extends her knee and flex her hip.  She also reports during therapy stretching of her piriformis on the right will cause shooting pain down her leg.   She continues to have back and neck spasms as well.  Home TENS unit has been helping her pain.  She is very frustrated and having difficulty working due to her pain.   Interval history 05/11/22 Charlene Griffin is here for f/u visit.  She had an evaluation by Neurology . MRI C spine, T spine, EEG, EMG/NCS RLE completed without explanation of RLE temors. She says neurology feels that this may be anxiety related due to negative workup.  TENS and Lyrica continue to help her pain. She is also using duloxetine. She says she missed her rheumatology visit and was unable to reschedule.    Interval history 07/16/22 Charlene Griffin is here for follow-up regarding her chronic pain.  Overall pain has been largely unchanged from prior visit.  She has also been treated for inflammatory fasciitis on the right by podiatry.  She has been provided a night splint to help treat this condition.  She is following with psychiatry and reports she has been approved for TMS.  She also may be following up with a different neurologist for reevaluation.   Interval history 10/12/22 Charlene Griffin is here for follow-up regarding her chronic pain.  She reports overall her pain and tremors is improved since she had treatment with TMS.  She does feel more fatigued since his treatment, she says psychiatry considering Vyvanse.  She continues taking Cymbalta and Lyrica, she feels that these are helping and is not reporting any significant side effects with these medications.  She has for handicapped placard form because when she has a flareup of her pain and spasms she is unable to ambulate the distance from the parking lot safely.   Pain Inventory Average Pain 5 Pain Right Now 5 My pain is intermittent, dull, stabbing, and aching  In the last 24 hours, has pain interfered with the following? General activity 6 Relation with others 7 Enjoyment of life 8 What TIME of day is your pain at its worst? morning  and evening Sleep (in  general) Poor  Pain is worse with: walking Pain improves with: rest, heat/ice, therapy/exercise, medication, and TENS Relief from Meds: 5  Family History  Problem Relation Age of Onset   Colon cancer Maternal Grandfather        passed away 56   Healthy Mother    Healthy Father    Asthma Sister    Factor V Leiden deficiency Sister    Ovarian cancer Paternal Grandmother    Social History   Socioeconomic History   Marital status: Married    Spouse name: Not on file   Number of children: Not on file   Years of education: Not on file   Highest education level: Not on file  Occupational History   Not on file  Tobacco Use   Smoking  status: Never   Smokeless tobacco: Never  Vaping Use   Vaping status: Some Days   Substances: Nicotine, Flavoring  Substance and Sexual Activity   Alcohol use: Yes    Comment: socially   Drug use: Never   Sexual activity: Yes    Birth control/protection: Pill  Other Topics Concern   Not on file  Social History Narrative   Not on file   Social Determinants of Health   Financial Resource Strain: Low Risk  (01/26/2022)   Received [facility], [facility]   Overall Financial Resource Strain (CARDIA)    Difficulty of Paying Living Expenses: Not very hard  Food Insecurity: No Food Insecurity (01/26/2022)   Received from [facility], [facility]   Hunger Vital Sign    Worried About Running Out of Food in the Last Year: Never true    Ran Out of Food in the Last Year: Never true  Transportation Needs: No Transportation Needs (01/26/2022)   Received from [facility], [facility]   PRAPARE - Transportation    Lack of Transportation (Medical): No    Lack of Transportation (Non-Medical): No  Physical Activity: Insufficiently Active (11/13/2020)   Received from [facility], [facility]   Exercise Vital Sign    Days of Exercise per Week: 5 days    Minutes of Exercise per Session: 20 min  Stress: Stress Concern Present  (11/13/2020)   Received from [facility], [facility] - Occupational Stress Questionnaire    Feeling of Stress : Very much  Social Connections: Unknown (05/07/2021)   Received from [facility], [facility]   Social Network    Social Network: Not on file   Past Surgical History:  Procedure Laterality Date   HERNIA REPAIR     x 2   INCISIONAL HERNIA REPAIR N/A 09/02/2019   Procedure: LAPAROSCOPIC INCISIONAL HERNIA REPAIR WITH MESH;  Surgeon: Kinsinger, De Blanch, MD;  Location: [facility];  Service: General;  Laterality: N/A;   LAPAROSCOPY N/A 12/29/2018   Procedure: LAPAROSCOPY DIAGNOSTIC;  Surgeon: Romualdo Bolk, MD;  Location: [facility];  Service: Gynecology;  Laterality: N/A;  1 hour surgery time/ follow Dr Rica Records case   LEFT OOPHORECTOMY     OVARIAN CYST REMOVAL Left 12/29/2018   Procedure: LAPARASCOPIC OOPHARECTOMY;  Surgeon: Romualdo Bolk, MD;  Location: [facility];  Service: Gynecology;  Laterality: Left;   WISDOM TOOTH EXTRACTION     Past Surgical History:  Procedure Laterality Date   HERNIA REPAIR     x 2   INCISIONAL HERNIA REPAIR N/A 09/02/2019   Procedure: LAPAROSCOPIC INCISIONAL HERNIA REPAIR WITH MESH;  Surgeon: Kinsinger, De Blanch, MD;  Location: [facility];  Service: General;  Laterality: N/A;   LAPAROSCOPY N/A 12/29/2018   Procedure: LAPAROSCOPY DIAGNOSTIC;  Surgeon: Romualdo Bolk, MD;  Location: [facility];  Service: Gynecology;  Laterality: N/A;  1 hour surgery time/ follow Dr Rica Records case   LEFT OOPHORECTOMY     OVARIAN CYST REMOVAL Left 12/29/2018   Procedure: LAPARASCOPIC OOPHARECTOMY;  Surgeon: Romualdo Bolk, MD;  Location: [facility];  Service: Gynecology;  Laterality: Left;   WISDOM TOOTH EXTRACTION     Past Medical History:  Diagnosis Date   ADHD    Allergy    Alopecia    Anxiety    Bronchitis    Celiac disease     Complication of anesthesia    intubation bronchitis  GERD (gastroesophageal reflux disease)    rare   Hemorrhoids    Immune deficiency disorder (HCC)    Insomnia    Major depression    Migraine with aura    Panic attack    with surgery   PCOS (polycystic ovarian syndrome)    PONV (postoperative nausea and vomiting)    Tinnitus    Umbilical hernia    BP 121/82   Pulse 79   Wt 177 lb (80.3 kg)   BMI 28.57 kg/m   Opioid Risk Score:   Fall Risk Score:  `1  Depression screen [facility] 2/9     07/13/2022    3:43 PM 06/27/2022   10:05 AM 05/11/2022    3:20 PM 03/16/2022    2:51 PM 01/09/2022    9:55 AM 11/23/2021    2:06 PM 07/14/2020    3:28 PM  Depression screen PHQ 2/9  Decreased Interest 0  0 0 0 3 0  Down, Depressed, Hopeless 0  0 0 0 2 0  PHQ - 2 Score 0  0 0 0 5 0  Altered sleeping      3   Tired, decreased energy      3   Change in appetite      1   Feeling bad or failure about yourself       1   Trouble concentrating      0   Moving slowly or fidgety/restless      3   Suicidal thoughts      0   PHQ-9 Score      16   Difficult doing work/chores      Very difficult      Information is confidential and restricted. Go to Review Flowsheets to unlock data.      Review of Systems  Musculoskeletal:  Positive for back pain, gait problem and neck pain.  All other systems reviewed and are negative.     Objective:   Physical Exam   Gen: no distress, normal appearing HEENT: oral mucosa pink and moist, NCAT Chest: normal effort, normal rate of breathing Abd: soft, non-distended Ext: no edema Psych: pleasant, normal affect Skin: intact Neuro: Awake and alert, follows commands, cranial nerves II through XII grossly intact sensation intact in all 4 extremities Moving all extremities to gravity and resistance Musculoskeletal: Tenderness to palpation throughout lumbar thoracic and cervical paraspinal muscles, periscapular muscles, highs, shoulders, elbows, knees             Head CT 06/12/21 No acute intracranial abnormality.    MRI spine 04/03/21 IMPRESSION: 1. Normal MRI appearance of the spinal cord. No cord signal changes to suggest myelopathy. No abnormal enhancement. 2. Mild degenerative disc disease at C3-4 through C6-7, with additional mild noncompressive disc bulging at L2-3. No significant stenosis or neural impingement. 3. Mild facet hypertrophy at L3-4 and L4-5.      Assessment & Plan:    Fibromyalgia -Continue duloxetine 60 mg daily -Continue Lyrica 100mg  TID, will hold off on increase due to concerns this could contribute to sedation -She would like to avoid opioid medications -Continue zynex device-has been providing benefit -Continue current regimen -Consider low dose naltrexone at a later visit -She reports improvement in her pain after TMS treatment   Lumbar and cervical spondylosis, history of SI joint dysfunction -Continue regimen as above -Zynex Cryoheat device- continue   Tremor and muscle spasms in right lower extremity -Neurology workup completed,  MRI C spine, T spine, EEG, EMG/NCS RLE  without cause found, -She reports this has improved after TMS   Migraine headache -Continue follow-up with neurology   Depression and anxiety -She is currently following with psychiatry -Had TMS completed with benefit  -Handicap parking form completed

## 2022-11-05 ENCOUNTER — Telehealth (HOSPITAL_BASED_OUTPATIENT_CLINIC_OR_DEPARTMENT_OTHER): Admitting: Psychiatry

## 2022-11-05 ENCOUNTER — Encounter (HOSPITAL_COMMUNITY): Payer: Self-pay | Admitting: Psychiatry

## 2022-11-05 VITALS — Wt 177.0 lb

## 2022-11-05 DIAGNOSIS — F4310 Post-traumatic stress disorder, unspecified: Secondary | ICD-10-CM

## 2022-11-05 DIAGNOSIS — F902 Attention-deficit hyperactivity disorder, combined type: Secondary | ICD-10-CM | POA: Diagnosis not present

## 2022-11-05 DIAGNOSIS — F331 Major depressive disorder, recurrent, moderate: Secondary | ICD-10-CM | POA: Diagnosis not present

## 2022-11-05 MED ORDER — SERTRALINE HCL 50 MG PO TABS
50.0000 mg | ORAL_TABLET | Freq: Every day | ORAL | 1 refills | Status: DC
Start: 2022-11-05 — End: 2022-12-20

## 2022-11-05 MED ORDER — ATOMOXETINE HCL 25 MG PO CAPS
25.0000 mg | ORAL_CAPSULE | Freq: Two times a day (BID) | ORAL | 1 refills | Status: DC
Start: 2022-11-05 — End: 2022-12-20
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
## 2022-11-19 ENCOUNTER — Other Ambulatory Visit: Payer: Self-pay

## 2022-11-19 ENCOUNTER — Emergency Department (HOSPITAL_COMMUNITY)

## 2022-11-19 ENCOUNTER — Emergency Department (HOSPITAL_COMMUNITY)
Admission: EM | Admit: 2022-11-19 | Discharge: 2022-11-20 | Disposition: A | Discharge status: Home / Self Care | Attending: Emergency Medicine | Admitting: Emergency Medicine

## 2022-11-19 DIAGNOSIS — R4182 Altered mental status, unspecified: Secondary | ICD-10-CM | POA: Insufficient documentation

## 2022-11-19 LAB — COOXEMETRY PANEL
Carboxyhemoglobin: 0.9 % (ref 0.5–1.5)
Methemoglobin: <0.7 % (ref 0.0–1.5)
O2 Saturation: 58.9 %
Total hemoglobin: 13.2 g/dL (ref 12.0–16.0)

## 2022-11-19 LAB — RAPID URINE DRUG SCREEN, HOSP PERFORMED
Amphetamines: NOT DETECTED
Barbiturates: NOT DETECTED
Benzodiazepines: NOT DETECTED
Cocaine: NOT DETECTED
Opiates: NOT DETECTED
Tetrahydrocannabinol: POSITIVE — AB

## 2022-11-19 LAB — CBC
HCT: 37 % (ref 36.0–46.0)
Hemoglobin: 12.8 g/dL (ref 12.0–15.0)
MCH: 31.6 pg (ref 26.0–34.0)
MCHC: 34.6 g/dL (ref 30.0–36.0)
MCV: 91.4 fL (ref 80.0–100.0)
Platelets: 317 10*3/uL (ref 150–400)
RBC: 4.05 MIL/uL (ref 3.87–5.11)
RDW: 12.6 % (ref 11.5–15.5)
WBC: 10.6 10*3/uL — ABNORMAL HIGH (ref 4.0–10.5)
nRBC: 0 % (ref 0.0–0.2)

## 2022-11-19 LAB — BASIC METABOLIC PANEL
Anion gap: 10 (ref 5–15)
BUN: 7 mg/dL (ref 6–20)
CO2: 22 mmol/L (ref 22–32)
Calcium: 9.2 mg/dL (ref 8.9–10.3)
Chloride: 103 mmol/L (ref 98–111)
Creatinine, Ser: 0.72 mg/dL (ref 0.44–1.00)
GFR, Estimated: >60 mL/min (ref >60)
Glucose, Bld: 100 mg/dL — ABNORMAL HIGH (ref 70–99)
Potassium: 3.8 mmol/L (ref 3.5–5.1)
Sodium: 135 mmol/L (ref 135–145)

## 2022-11-19 LAB — ETHANOL: Alcohol, Ethyl (B): <10 mg/dL (ref <10)

## 2022-11-19 LAB — HCG, SERUM, QUALITATIVE: Preg, Serum: NEGATIVE

## 2022-11-19 NOTE — ED Notes (Signed)
Pt is in radiology.

## 2022-11-19 NOTE — ED Provider Notes (Signed)
[place] EMERGENCY DEPARTMENT AT [facility] Provider Note   CSN: [id number] Arrival date & time: 11/19/22  1839     History  Chief Complaint  Patient presents with   Altered Mental Status    SAIDEE CASSENS is a 32 y.o. female presenting from home with confusion and altered mental status.  Per history provided by EMS and family member, patient reports that she smoked a vape for the first time.  She is not certain what was in the vape pen, or whether it contained THC, but both she and her husband became acutely altered.  She says her speech was off, and she only remembers intermittent things from this evening.  The patient is prescribed Lyrica.  She is adamant that she did not overdose or take any prescription medications or other medicines, and denies any suicidal ideation to me.  There is a reported question about the patient being found "foaming at the mouth" but no reported seizure-like activity.  She reports she does have an active carbon monoxide detector in the house.  She says there was a 32 year old who is her husband's daughter present in the house.  The 64 year old is present here in the ED at bedside, with the mother's permission, and she is feeling well without sick symptoms.  HPI     Home Medications Prior to Admission medications   Medication Sig Start Date End Date Taking? Authorizing Provider  albuterol (PROVENTIL) (2.5 MG/3ML) 0.083% nebulizer solution Inhale into the lungs as needed. 01/19/20   [provider]  atomoxetine (STRATTERA) 25 MG capsule Take 1 capsule (25 mg total) by mouth 2 (two) times daily with a meal. 11/05/22 11/05/23  Arfeen, Phillips Grout, MD  clobetasol ointment (TEMOVATE) 0.05 %  03/02/22   [provider]  DULoxetine (CYMBALTA) 60 MG capsule Take 60 mg by mouth daily.    [provider]  lamoTRIgine (LAMICTAL) 100 MG tablet Take 1 tablet (100 mg total) by mouth at bedtime. 10/25/21   Lomax, Amy, NP   lisdexamfetamine (VYVANSE) 20 MG capsule Take 1 capsule (20 mg total) by mouth daily. 07/26/22   Arfeen, Phillips Grout, MD  meloxicam (MOBIC) 15 MG tablet Take 15 mg by mouth daily. 05/10/20   Caryl Bis, MD  montelukast (SINGULAIR) 10 MG tablet Take 1 tablet (10 mg total) by mouth at bedtime. 11/08/20   Theadora Rama Scales, PA-C  norethindrone (MICRONOR) 0.35 MG tablet Take 1 tablet by mouth daily.    [provider]  pregabalin (LYRICA) 100 MG capsule Take 1 capsule (100 mg total) by mouth 3 (three) times daily. 07/16/22   Fanny Dance, MD  Rimegepant Sulfate (NURTEC) 75 MG TBDP Take 1 tab at onset of migraine.  May repeat in 2 hrs, if needed.  Max dose: 2 tabs/day. This is a 30 day prescription. 07/03/21   Levert Feinstein, MD  sertraline (ZOLOFT) 50 MG tablet Take 1 tablet (50 mg total) by mouth daily. 11/05/22 03/05/23  Cleotis Nipper, MD      Allergies    Botox [botulinum toxin type a], Diphenhydramine, Gluten meal, and Onabotulinumtoxina (cosmetic)    Review of Systems   Review of Systems  Physical Exam Updated Vital Signs BP 109/67   Pulse 74   Temp 98.7 F (37.1 C) (Oral)   Resp 12   Ht 5\' 6"  (1.676 m)   Wt 77.1 kg   LMP 11/12/2022   SpO2 99%   BMI 27.44 kg/m  Physical Exam Constitutional:  General: She is not in acute distress.    Comments: Speech is slow, patient is intermittently somnolent, but arouses easily to voice  HENT:     Head: Normocephalic and atraumatic.  Eyes:     Conjunctiva/sclera: Conjunctivae normal.     Pupils: Pupils are equal, round, and reactive to light.     Comments: Injected scleral bilatearlly  Cardiovascular:     Rate and Rhythm: Normal rate and regular rhythm.  Pulmonary:     Effort: Pulmonary effort is normal. No respiratory distress.  Abdominal:     General: There is no distension.     Tenderness: There is no abdominal tenderness.  Skin:    General: Skin is warm and dry.  Neurological:     General: No focal deficit present.      Mental Status: She is alert and oriented to person, place, and time. Mental status is at baseline.     Sensory: No sensory deficit.     Motor: No weakness.     ED Results / Procedures / Treatments   Labs (all labs ordered are listed, but only abnormal results are displayed) Labs Reviewed  BASIC METABOLIC PANEL - Abnormal; Notable for the following components:      Result Value   Glucose, Bld 100 (*)    All other components within normal limits  CBC - Abnormal; Notable for the following components:   WBC 10.6 (*)    All other components within normal limits  RAPID URINE DRUG SCREEN, HOSP PERFORMED - Abnormal; Notable for the following components:   Tetrahydrocannabinol POSITIVE (*)    All other components within normal limits  COOXEMETRY PANEL  ETHANOL  HCG, SERUM, QUALITATIVE    EKG EKG Interpretation Date/Time:  Monday November 19 2022 21:15:09 EST Ventricular Rate:  95 PR Interval:  183 QRS Duration:  84 QT Interval:  353 QTC Calculation: 444 R Axis:   73  Text Interpretation: Sinus rhythm Confirmed by Alvester Chou [id number]) on 11/19/2022 9:16:10 PM  Radiology CT Head Wo Contrast  Result Date: 11/19/2022 CLINICAL DATA:  Foaming at the mouth and shaking in the bed after THC pen. EXAM: CT HEAD WITHOUT CONTRAST TECHNIQUE: Contiguous axial images were obtained from the base of the skull through the vertex without intravenous contrast. RADIATION DOSE REDUCTION: This exam was performed according to the departmental dose-optimization program which includes automated exposure control, adjustment of the mA and/or kV according to patient size and/or use of iterative reconstruction technique. COMPARISON:  CT head 06/12/2021 FINDINGS: Brain: No intracranial hemorrhage, mass effect, or evidence of acute infarct. No hydrocephalus. No extra-axial fluid collection. Vascular: No hyperdense vessel or unexpected calcification. Skull: No fracture or focal lesion. Sinuses/Orbits: No acute  finding. Other: None. IMPRESSION: No acute intracranial abnormality. Electronically Signed   By: Minerva Fester M.D.   On: 11/19/2022 23:44   DG Chest Portable 1 View  Result Date: 11/19/2022 CLINICAL DATA:  Evaluate for aspiration EXAM: PORTABLE CHEST 1 VIEW COMPARISON:  Chest x-ray 02/07/2020 FINDINGS: The heart size and mediastinal contours are within normal limits. Both lungs are clear. The visualized skeletal structures are unremarkable. IMPRESSION: No active disease. Electronically Signed   By: Darliss Cheney M.D.   On: 11/19/2022 23:21    Procedures Procedures    Medications Ordered in ED Medications - No data to display  ED Course/ Medical Decision Making/ A&P Clinical Course as of 11/20/22 0003  Mon Nov 19, 2022  2257 Tetrahydrocannabinol(!): POSITIVE [MT]  2316 Pt ambulated to bathroom -  still a bit wobbly, but able to walk.  She has some improved cognition but not back to baseline.  She is repeatedly asking to get her "green bag" which has her pills in it; I explained this bag is not in the room and may not have been brought to the ED.  [MT]    Clinical Course User Index [MT] Damascus Feldpausch, Kermit Balo, MD                                 Medical Decision Making Amount and/or Complexity of Data Reviewed Labs: ordered. Decision-making details documented in ED Course. Radiology: ordered. ECG/medicine tests: ordered.   This patient presents to the ED with concern for altered mental status. This involves an extensive number of treatment options, and is a complaint that carries with it a high risk of complications and morbidity.  The differential diagnosis includes toxidrome most likely versus metabolic derangement versus other  Given her husband's presenting with very similar symptoms, I strongly suspect this is a shared drug toxidrome, likely from this vape pen.    This does not appear to have been intentional overdose, per the history obtained from both the wife as well as her  husband.  Given the fact that her 64 year old daughter is feeling perfectly fine, and the patient is not having a headache, no hypoxia, my suspicion for carbon monoxide poisoning is lower at this time.  Additional history obtained from EMS, husband, daughter   I ordered and personally interpreted labs.  The pertinent results include: No emergent findings.  UDS is positive for THC  I ordered imaging studies including CT head, DG chest I independently visualized and interpreted imaging which showed no emergent findings I agree with the radiologist interpretation  The patient was maintained on a cardiac monitor.  I personally viewed and interpreted the cardiac monitored which showed an underlying rhythm of: Sinus rhythm  Per my interpretation the patient's ECG shows no acute ischemic findings  I have reviewed the patients home medicines and have made adjustments as needed  Test Considered: Low suspicion for acute meningitis, PE.  No indication for LP at this time.  Low suspicion for demyelinating disease.  No indication for MRI.  Doubt stroke. Doubt sepsis.  After the interventions noted above, I reevaluated the patient and found that they have: improved  Social Determinants of Health: I strongly encouraged the patient to avoid using any vape products which may have various substances in them causing confusion; she verbalized understanding   Dispostion:  Patient signed out to Dr Gloris Manchester EDP pending reassessment after improved sobriety.         Final Clinical Impression(s) / ED Diagnoses Final diagnoses:  Altered mental status, unspecified altered mental status type    Rx / DC Orders ED Discharge Orders     None         Smokey Melott, Kermit Balo, MD 11/20/22 0003

## 2022-11-19 NOTE — Discharge Instructions (Addendum)
Please avoid using vape pens or any THC/marijuana products, or other illicit drugs, that can cause confusion. Sometimes these products include dangerous chemicals that can make you confused or sick.  It is possible you had a bad reaction to these medications this evening.    Do NOT drive for the next 24 hours - and until you feel completely sober and back to normal.  Do not swim or perform dangerous activities.  Call to follow up with your primary care office as soon as possible.  If your symptoms are getting worse, including worsening confusion, severe headache, vomiting, balance problems, or any other emergency medical concerns, please return immediately to the hospital.

## 2022-11-19 NOTE — ED Notes (Signed)
Call to lab regarding delay as blood has not resulted

## 2022-11-19 NOTE — ED Triage Notes (Signed)
Patient brought in by EMS from home with c/o AMS. Per EMS patient hit a THC pen today and was found by her husband foaming from the mouth and shaking in there bed, no seizure like activity. She has HX of mental health and is non compliant with meds. She denies using drugs or alcohol. 130/80 CBG: 133 110 98%  RA 18g in L AC EMS gave NS

## 2022-11-19 NOTE — ED Notes (Signed)
Pt states that she is feeling better and she is asking if she can go home.  EDP notified

## 2022-11-20 NOTE — ED Notes (Signed)
Pt is more alert, ambulated to the bathroom to void.  I brought her a ham sandwich which she declines due to gluten intolerance.  Pt is resting.

## 2022-11-20 NOTE — ED Notes (Signed)
Pt given gingerale and water to drink

## 2022-11-20 NOTE — ED Provider Notes (Signed)
Care of patient assumed from Dr. Renaye Rakers.  This patient presented for confusion and somnolence, suspected intoxication.  Her husband developed same symptoms at the same time and is also here in the ED.  She has improved since arrival.  Will require additional time to metabolize. Physical Exam  BP 121/66   Pulse 65   Temp 99.4 F (37.4 C)   Resp 14   Ht 5\' 6"  (1.676 m)   Wt 77.1 kg   LMP 11/12/2022   SpO2 100%   BMI 27.44 kg/m   Physical Exam Vitals and nursing note reviewed.  Constitutional:      General: She is not in acute distress.    Appearance: Normal appearance. She is well-developed. She is not ill-appearing, toxic-appearing or diaphoretic.  HENT:     Head: Normocephalic and atraumatic.     Right Ear: External ear normal.     Left Ear: External ear normal.     Nose: Nose normal.     Mouth/Throat:     Mouth: Mucous membranes are moist.  Eyes:     Extraocular Movements: Extraocular movements intact.     Conjunctiva/sclera: Conjunctivae normal.  Cardiovascular:     Rate and Rhythm: Normal rate and regular rhythm.  Pulmonary:     Effort: Pulmonary effort is normal. No respiratory distress.  Abdominal:     General: There is no distension.     Palpations: Abdomen is soft.  Musculoskeletal:        General: No swelling.     Cervical back: Normal range of motion and neck supple.  Skin:    General: Skin is warm and dry.     Coloration: Skin is not jaundiced or pale.  Neurological:     General: No focal deficit present.     Mental Status: She is alert and oriented to person, place, and time.  Psychiatric:        Mood and Affect: Mood normal.        Behavior: Behavior normal.     Procedures  Procedures  ED Course / MDM   Clinical Course as of 11/20/22 0502  Mon Nov 19, 2022  2257 Tetrahydrocannabinol(!): POSITIVE [MT]  2316 Pt ambulated to bathroom - still a bit wobbly, but able to walk.  She has some improved cognition but not back to baseline.  She is repeatedly  asking to get her "green bag" which has her pills in it; I explained this bag is not in the room and may not have been brought to the ED.  [MT]    Clinical Course User Index [MT] Trifan, Kermit Balo, MD   Medical Decision Making Amount and/or Complexity of Data Reviewed Labs: ordered. Decision-making details documented in ED Course. Radiology: ordered. ECG/medicine tests: ordered.   On assessment, patient is awake and alert.  She seems to be amnestic to which she took.  She remains somnolent.  She remained in the ED to continue to metabolize.  On further reassessment, patient now sitting up in bed, more awake.  She suspects that it was the vape pen.  She was discharged in stable condition.       Gloris Manchester, MD 11/20/22 [phone]

## 2022-11-20 NOTE — ED Notes (Signed)
Earlier pt was anxious about a green bag which is not at the hospital with her. When asked about this green bag at this time pt states that it contained the two vapes they consumed as well as her home meds.

## 2022-12-02 ENCOUNTER — Other Ambulatory Visit (HOSPITAL_COMMUNITY): Payer: Self-pay | Admitting: Psychiatry

## 2022-12-02 DIAGNOSIS — F331 Major depressive disorder, recurrent, moderate: Secondary | ICD-10-CM

## 2022-12-02 DIAGNOSIS — F902 Attention-deficit hyperactivity disorder, combined type: Secondary | ICD-10-CM

## 2022-12-02 DIAGNOSIS — F4310 Post-traumatic stress disorder, unspecified: Secondary | ICD-10-CM

## 2022-12-14 ENCOUNTER — Encounter: Payer: Self-pay | Admitting: Physical Medicine & Rehabilitation

## 2022-12-14 ENCOUNTER — Encounter: Attending: Physical Medicine & Rehabilitation | Admitting: Physical Medicine & Rehabilitation

## 2022-12-14 VITALS — Wt 167.0 lb

## 2022-12-14 DIAGNOSIS — M797 Fibromyalgia: Secondary | ICD-10-CM

## 2022-12-14 DIAGNOSIS — M47812 Spondylosis without myelopathy or radiculopathy, cervical region: Secondary | ICD-10-CM

## 2022-12-14 DIAGNOSIS — F419 Anxiety disorder, unspecified: Secondary | ICD-10-CM

## 2022-12-14 DIAGNOSIS — M4306 Spondylolysis, lumbar region: Secondary | ICD-10-CM

## 2022-12-14 DIAGNOSIS — F32A Depression, unspecified: Secondary | ICD-10-CM | POA: Insufficient documentation

## 2022-12-14 DIAGNOSIS — M62838 Other muscle spasm: Secondary | ICD-10-CM | POA: Insufficient documentation

## 2022-12-14 MED ORDER — DULOXETINE HCL 20 MG PO CPEP: 20.0000 mg | ORAL_CAPSULE | Freq: Every day | ORAL | 0 refills | Status: DC

## 2022-12-14 MED ORDER — DULOXETINE HCL 30 MG PO CPEP: 30.0000 mg | ORAL_CAPSULE | Freq: Every day | ORAL | 0 refills | Status: DC

## 2022-12-14 NOTE — Progress Notes (Unsigned)
Subjective:    Patient ID: Charlene Griffin, female    DOB: 07/19/1990, 32 y.o.   MRN: [id number]  HPI  HPI 12/14/22 Charlene Griffin is a 32 y.o. year old female  who  has a past medical history of ADHD, Allergy, Alopecia, Anxiety, Bronchitis, Celiac disease, Complication of anesthesia, GERD (gastroesophageal reflux disease), Hemorrhoids, Immune deficiency disorder (HCC), Insomnia, Major depression, Migraine with aura, Panic attack, PCOS (polycystic ovarian syndrome), PONV (postoperative nausea and vomiting), Tinnitus, and Umbilical hernia.   She has been diagnosed with fibromyalgia.  They are presenting to PM&R clinic as a new patient for pain management evaluation.  She reports she has had migraines for many years.  About 2 years ago she developed pain in her lower back and hips.  Pain is sometimes worse on the left and other times worse on the right.  Pain is worsened by any type of activity.  She is unable to work consistently due to her chronic pain.  Pain is worse when the weather changes.  She is also developed a tremor that is worse in her right lower extremity.  Tremor is worse when pain is most severe.  She reports she has had this since the beginning of the year.  She reports a addiction history in her family and would like to avoid opioid medications.  She reports poor sleep.  Chronic anxiety and depression.  No SI or HI.  Reports a lot of stress due to her pain and not being able to work.  Prior constipation but this improved after she stopped eating gluten. She is currently on birth control and not try to get pregnant.  She may consider getting pregnant in the future.  She reports she felt the best when she was in a warm dry climate.  She reports the pain is so severe it will cause her to vomit.  She reports muscle cramping in her legs.  Denies bowel or bladder incontinence.   Interval History by phone call 12/14/22  Pt seen by phone visit today, video visit scheduled but she had  computer issues.  Flair once a month- 8 to 10 Pain below 5 mostly  Sertraline   Pain Inventory Average Pain 3 Pain Right Now 5 My pain is intermittent, sharp, dull, stabbing, and aching  In the last 24 hours, has pain interfered with the following? General activity 0 Relation with others 4 Enjoyment of life 7 What TIME of day is your pain at its worst? daytime Sleep (in general) Poor  Pain is worse with: walking, bending, sitting, inactivity, standing, and some activites Pain improves with: rest, heat/ice, and medication Relief from Meds: 5  Family History  Problem Relation Age of Onset   Colon cancer Maternal Grandfather        passed away 70   Healthy Mother    Healthy Father    Asthma Sister    Factor V Leiden deficiency Sister    Ovarian cancer Paternal Grandmother    Social History   Socioeconomic History   Marital status: Married    Spouse name: Not on file   Number of children: Not on file   Years of education: Not on file   Highest education level: Not on file  Occupational History   Not on file  Tobacco Use   Smoking status: Never   Smokeless tobacco: Never  Vaping Use   Vaping status: Some Days   Substances: Nicotine, Flavoring  Substance and Sexual Activity   Alcohol use:  Yes    Comment: socially   Drug use: Never   Sexual activity: Yes    Birth control/protection: Pill  Other Topics Concern   Not on file  Social History Narrative   Not on file   Social Determinants of Health   Financial Resource Strain: Low Risk  (01/26/2022)   Received from [facility], [facility]   Overall Financial Resource Strain (CARDIA)    Difficulty of Paying Living Expenses: Not very hard  Food Insecurity: No Food Insecurity (01/26/2022)   Received from [facility], [facility]   Hunger Vital Sign    Worried About Running Out of Food in the Last Year: Never true    Ran Out of Food in the Last Year: Never true  Transportation Needs: No Transportation  Needs (01/26/2022)   Received from [facility], [facility]   PRAPARE - Transportation    Lack of Transportation (Medical): No    Lack of Transportation (Non-Medical): No  Physical Activity: Insufficiently Active (11/13/2020)   Received from [facility], [facility]   Exercise Vital Sign    Days of Exercise per Week: 5 days    Minutes of Exercise per Session: 20 min  Stress: Stress Concern Present (11/13/2020)   Received from [facility], [facility] - Occupational Stress Questionnaire    Feeling of Stress : Very much  Social Connections: Unknown (05/07/2021)   Received from [facility], [facility]   Social Network    Social Network: Not on file   Past Surgical History:  Procedure Laterality Date   HERNIA REPAIR     x 2   INCISIONAL HERNIA REPAIR N/A 09/02/2019   Procedure: LAPAROSCOPIC INCISIONAL HERNIA REPAIR WITH MESH;  Surgeon: Kinsinger, De Blanch, MD;  Location: [facility];  Service: General;  Laterality: N/A;   LAPAROSCOPY N/A 12/29/2018   Procedure: LAPAROSCOPY DIAGNOSTIC;  Surgeon: Romualdo Bolk, MD;  Location: [facility];  Service: Gynecology;  Laterality: N/A;  1 hour surgery time/ follow Dr Rica Records case   LEFT OOPHORECTOMY     OVARIAN CYST REMOVAL Left 12/29/2018   Procedure: LAPARASCOPIC OOPHARECTOMY;  Surgeon: Romualdo Bolk, MD;  Location: [facility];  Service: Gynecology;  Laterality: Left;   WISDOM TOOTH EXTRACTION     Past Surgical History:  Procedure Laterality Date   HERNIA REPAIR     x 2   INCISIONAL HERNIA REPAIR N/A 09/02/2019   Procedure: LAPAROSCOPIC INCISIONAL HERNIA REPAIR WITH MESH;  Surgeon: Kinsinger, De Blanch, MD;  Location: [facility];  Service: General;  Laterality: N/A;   LAPAROSCOPY N/A 12/29/2018   Procedure: LAPAROSCOPY DIAGNOSTIC;  Surgeon: Romualdo Bolk, MD;  Location: [facility];  Service: Gynecology;  Laterality: N/A;  1  hour surgery time/ follow Dr Rica Records case   LEFT OOPHORECTOMY     OVARIAN CYST REMOVAL Left 12/29/2018   Procedure: LAPARASCOPIC OOPHARECTOMY;  Surgeon: Romualdo Bolk, MD;  Location: [facility];  Service: Gynecology;  Laterality: Left;   WISDOM TOOTH EXTRACTION     Past Medical History:  Diagnosis Date   ADHD    Allergy    Alopecia    Anxiety    Bronchitis    Celiac disease    Complication of anesthesia    intubation bronchitis   GERD (gastroesophageal reflux disease)    rare   Hemorrhoids    Immune deficiency disorder (HCC)    Insomnia    Major depression  Migraine with aura    Panic attack    with surgery   PCOS (polycystic ovarian syndrome)    PONV (postoperative nausea and vomiting)    Tinnitus    Umbilical hernia    Wt 167 lb (75.8 kg) Comment: reported  LMP 11/12/2022   BMI 26.95 kg/m   Opioid Risk Score:   Fall Risk Score:  `1  Depression screen PHQ 2/9     12/14/2022    2:54 PM 07/13/2022    3:43 PM 06/27/2022   10:05 AM 05/11/2022    3:20 PM 03/16/2022    2:51 PM 01/09/2022    9:55 AM 11/23/2021    2:06 PM  Depression screen PHQ 2/9  Decreased Interest 2 0  0 0 0 3  Down, Depressed, Hopeless 2 0  0 0 0 2  PHQ - 2 Score 4 0  0 0 0 5  Altered sleeping       3  Tired, decreased energy       3  Change in appetite       1  Feeling bad or failure about yourself        1  Trouble concentrating       0  Moving slowly or fidgety/restless       3  Suicidal thoughts       0  PHQ-9 Score       16  Difficult doing work/chores       Very difficult     Information is confidential and restricted. Go to Review Flowsheets to unlock data.     Review of Systems     Objective:   Physical Exam  Phone call       Assessment & Plan:   Fibromyalgia, improved after TMS -Decrease duloxetine to 30 mg daily for 2 weeks, then 20mg  for 1 week then Discontinue  -Continue Lyrica, she was ordered 100mg  TID but only used this July 2024 and continued  on Lyrica 75mg  TID likely from prior script- not on purpose -OK to decrease lyrica back  to BID a few weeks after stopping duloxetine  -She would like to avoid opioid medications -Continue zynex device-has been providing benefit -Continue current regimen -Consider low dose naltrexone at a later visit -She reports improvement in her pain after TMS treatment   Lumbar and cervical spondylosis, history of SI joint dysfunction -Continue regimen as above -Zynex Cryoheat device- continue   Tremor and muscle spasms in right lower extremity -Neurology workup completed,  MRI C spine, T spine, EEG, EMG/NCS RLE without cause found, -She reports this has improved after TMS   Migraine headache -Continue follow-up with neurology   Depression and anxiety -She is currently following with psychiatry -Had TMS completed with benefit

## 2022-12-20 ENCOUNTER — Encounter (HOSPITAL_COMMUNITY): Payer: Self-pay | Admitting: Psychiatry

## 2022-12-20 ENCOUNTER — Ambulatory Visit (HOSPITAL_COMMUNITY): Admitting: Psychiatry

## 2022-12-20 VITALS — BP 117/81 | HR 67 | Ht 66.0 in | Wt 166.0 lb

## 2022-12-20 DIAGNOSIS — F4310 Post-traumatic stress disorder, unspecified: Secondary | ICD-10-CM | POA: Diagnosis not present

## 2022-12-20 DIAGNOSIS — F331 Major depressive disorder, recurrent, moderate: Secondary | ICD-10-CM

## 2022-12-20 DIAGNOSIS — F902 Attention-deficit hyperactivity disorder, combined type: Secondary | ICD-10-CM | POA: Diagnosis not present

## 2022-12-20 MED ORDER — SERTRALINE HCL 50 MG PO TABS: 50.0000 mg | ORAL_TABLET | Freq: Every day | ORAL | 1 refills | Status: DC

## 2022-12-20 MED ORDER — ATOMOXETINE HCL 40 MG PO CAPS: 40.0000 mg | ORAL_CAPSULE | Freq: Two times a day (BID) | ORAL | 1 refills | Status: DC

## 2022-12-20 MED ORDER — ARIPIPRAZOLE 2 MG PO TABS: 2.0000 mg | ORAL_TABLET | Freq: Every day | ORAL | 1 refills | Status: DC
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
## 2022-12-25 ENCOUNTER — Telehealth (HOSPITAL_COMMUNITY): Payer: Self-pay

## 2022-12-25 NOTE — Telephone Encounter (Signed)
She only tried for 5 days and it is a very low dose.  She can try it in the morning if night time causing vivid dream.  If she continued to have these symptoms after a week and she can discontinue.

## 2022-12-25 NOTE — Telephone Encounter (Signed)
Patient is calling to let you know that she started the Abilify on Thursday and since then she has had a migraine, increased irritability, vivid dreams, and dry mouth. Please review and advise, thank you

## 2022-12-26 NOTE — Telephone Encounter (Signed)
She can discontinue Abilify. If agree then start Lamictal 25 mg daily. Need to watch for rash and if occurs than stop the medicine.

## 2022-12-27 ENCOUNTER — Other Ambulatory Visit (HOSPITAL_COMMUNITY): Payer: Self-pay

## 2022-12-27 MED ORDER — LAMOTRIGINE 25 MG PO TABS: 25.0000 mg | ORAL_TABLET | Freq: Every day | ORAL | 0 refills | Status: DC

## 2022-12-27 NOTE — Telephone Encounter (Signed)
Called patient and she has stopped the abilify and is willing to start the Lamictal. I sent a one month supply to the pharmacy and informed patient. I advised patient if she sees a rash to stop the medication and call me

## 2023-01-17 ENCOUNTER — Encounter: Payer: Self-pay | Admitting: Physical Medicine & Rehabilitation

## 2023-01-29 ENCOUNTER — Other Ambulatory Visit (HOSPITAL_COMMUNITY): Payer: Self-pay

## 2023-01-29 MED ORDER — LAMOTRIGINE 25 MG PO TABS: 25.0000 mg | ORAL_TABLET | Freq: Every day | ORAL | 0 refills | Status: DC

## 2023-02-07 ENCOUNTER — Other Ambulatory Visit: Payer: Self-pay | Admitting: Physical Medicine & Rehabilitation

## 2023-02-07 ENCOUNTER — Telehealth (HOSPITAL_COMMUNITY): Payer: Self-pay

## 2023-02-07 ENCOUNTER — Encounter (HOSPITAL_COMMUNITY): Payer: Self-pay | Admitting: Psychiatry

## 2023-02-07 ENCOUNTER — Ambulatory Visit (HOSPITAL_BASED_OUTPATIENT_CLINIC_OR_DEPARTMENT_OTHER): Admitting: Psychiatry

## 2023-02-07 VITALS — BP 128/74 | HR 82 | Ht 72.0 in | Wt 165.0 lb

## 2023-02-07 DIAGNOSIS — F4310 Post-traumatic stress disorder, unspecified: Secondary | ICD-10-CM | POA: Diagnosis not present

## 2023-02-07 DIAGNOSIS — F331 Major depressive disorder, recurrent, moderate: Secondary | ICD-10-CM

## 2023-02-07 DIAGNOSIS — F902 Attention-deficit hyperactivity disorder, combined type: Secondary | ICD-10-CM

## 2023-02-07 MED ORDER — SERTRALINE HCL 50 MG PO TABS: 50.0000 mg | ORAL_TABLET | Freq: Every day | ORAL | 1 refills | Status: DC

## 2023-02-07 MED ORDER — ATOMOXETINE HCL 40 MG PO CAPS: 40.0000 mg | ORAL_CAPSULE | Freq: Two times a day (BID) | ORAL | 1 refills | Status: DC

## 2023-02-07 MED ORDER — LAMOTRIGINE 150 MG PO TABS: 150.0000 mg | ORAL_TABLET | Freq: Every day | ORAL | 1 refills | Status: DC
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
## 2023-02-07 NOTE — Telephone Encounter (Signed)
Patient just called she wanted you to know that she remembered the  medication - it was Lamictal 25 mg every day and 2 po at bedtime.

## 2023-03-15 ENCOUNTER — Ambulatory Visit: Admitting: Physical Medicine & Rehabilitation

## 2023-03-21 ENCOUNTER — Other Ambulatory Visit (HOSPITAL_COMMUNITY): Payer: Self-pay | Admitting: Psychiatry

## 2023-03-21 DIAGNOSIS — F331 Major depressive disorder, recurrent, moderate: Secondary | ICD-10-CM

## 2023-03-21 DIAGNOSIS — F4310 Post-traumatic stress disorder, unspecified: Secondary | ICD-10-CM

## 2023-03-26 ENCOUNTER — Other Ambulatory Visit (HOSPITAL_COMMUNITY): Payer: Self-pay | Admitting: *Deleted

## 2023-03-26 DIAGNOSIS — F331 Major depressive disorder, recurrent, moderate: Secondary | ICD-10-CM

## 2023-03-26 DIAGNOSIS — F4310 Post-traumatic stress disorder, unspecified: Secondary | ICD-10-CM

## 2023-03-26 MED ORDER — LAMOTRIGINE 150 MG PO TABS: 150.0000 mg | ORAL_TABLET | Freq: Every day | ORAL | 0 refills | Status: DC

## 2023-04-05 ENCOUNTER — Encounter: Payer: Self-pay | Admitting: Physical Medicine & Rehabilitation

## 2023-04-05 ENCOUNTER — Encounter: Attending: Physical Medicine & Rehabilitation | Admitting: Physical Medicine & Rehabilitation

## 2023-04-05 VITALS — BP 102/63 | HR 90 | Wt 161.2 lb

## 2023-04-05 DIAGNOSIS — M797 Fibromyalgia: Secondary | ICD-10-CM | POA: Diagnosis not present

## 2023-04-05 DIAGNOSIS — Z79891 Long term (current) use of opiate analgesic: Secondary | ICD-10-CM | POA: Diagnosis present

## 2023-04-05 DIAGNOSIS — F32A Depression, unspecified: Secondary | ICD-10-CM | POA: Diagnosis not present

## 2023-04-05 DIAGNOSIS — Z5181 Encounter for therapeutic drug level monitoring: Secondary | ICD-10-CM | POA: Diagnosis present

## 2023-04-05 DIAGNOSIS — G894 Chronic pain syndrome: Secondary | ICD-10-CM

## 2023-04-05 NOTE — Progress Notes (Signed)
 Subjective:    Patient ID: Charlene Griffin, female    DOB: [birth year], 33 y.o.   MRN: [id number]  HPI  HPI 12/14/22 Charlene Griffin is a 33 y.o. year old female  who  has a past medical history of ADHD, Allergy, Alopecia, Anxiety, Bronchitis, Celiac disease, Complication of anesthesia, GERD (gastroesophageal reflux disease), Hemorrhoids, Immune deficiency disorder (HCC), Insomnia, Major depression, Migraine with aura, Panic attack, PCOS (polycystic ovarian syndrome), PONV (postoperative nausea and vomiting), Tinnitus, and Umbilical hernia.   She has been diagnosed with fibromyalgia.  They are presenting to PM&R clinic as a new patient for pain management evaluation.  She reports she has had migraines for many years.  About 2 years ago she developed pain in her lower back and hips.  Pain is sometimes worse on the left and other times worse on the right.  Pain is worsened by any type of activity.  She is unable to work consistently due to her chronic pain.  Pain is worse when the weather changes.  She is also developed a tremor that is worse in her right lower extremity.  Tremor is worse when pain is most severe.  She reports she has had this since the beginning of the year.  She reports a addiction history in her family and would like to avoid opioid medications.  She reports poor sleep.  Chronic anxiety and depression.  No SI or HI.  Reports a lot of stress due to her pain and not being able to work.  Prior constipation but this improved after she stopped eating gluten. She is currently on birth control and not try to get pregnant.  She may consider getting pregnant in the future.  She reports she felt the best when she was in a warm dry climate.  She reports the pain is so severe it will cause her to vomit.  She reports muscle cramping in her legs.  Denies bowel or bladder incontinence.   Interval History by phone call 12/14/22  Pt seen by phone visit today, video visit scheduled but she had  computer issues. Pain and spasms much improved since TMS. Pain is mostly below 5/10. Once a month it will go to 8/10. She would like to wean down Cymbalta and stop this medication.  She has been started on sertraline.  She is also interested in trying to wean down lyrica.    Interval history 04/05/2023 Patient reports that she has been doing overall well since completing TMS.  She feels like her pain continues to be improved since this time.  She is still working with physical therapy through [facility], getting dry needling as part of this.  She will intermittently get pain flares about 2 times a month where she has worsening shoulder and hip pain.  Other days pain is fairly well-controlled.  Pain often occurs in her shoulders and hips and she will have occasional reoccurrence of right leg twitching.  She has weaned off Lyrica and Cymbalta.    Pain Inventory Average Pain 2 Pain Right Now 1 My pain is intermittent, burning, stabbing, and aching  In the last 24 hours, has pain interfered with the following? General activity 0 Relation with others 0 Enjoyment of life 0 What TIME of day is your pain at its worst? morning , daytime, evening, and night Sleep (in general) Poor  Pain is worse with: walking, bending, sitting, inactivity, standing, and some activites Pain improves with: heat/ice Relief from Meds:  Patient stated that she is  not taking any medication  Family History  Problem Relation Age of Onset   Colon cancer Maternal Grandfather        passed away 62   Healthy Mother    Healthy Father    Asthma Sister    Factor V Leiden deficiency Sister    Ovarian cancer Paternal Grandmother    Social History   Socioeconomic History   Marital status: Married    Spouse name: Not on file   Number of children: Not on file   Years of education: Not on file   Highest education level: Not on file  Occupational History   Not on file  Tobacco Use   Smoking status: Never   Smokeless  tobacco: Never  Vaping Use   Vaping status: Some Days   Substances: Nicotine, Flavoring  Substance and Sexual Activity   Alcohol use: Yes    Comment: socially   Drug use: Never   Sexual activity: Yes    Birth control/protection: Pill  Other Topics Concern   Not on file  Social History Narrative   Not on file   Social Drivers of Health   Financial Resource Strain: Low Risk  (01/26/2022)   Received from [facility], [facility]   Overall Financial Resource Strain (CARDIA)    Difficulty of Paying Living Expenses: Not very hard  Food Insecurity: No Food Insecurity (01/26/2022)   Received [facility], [facility]   Hunger Vital Sign    Worried About Running Out of Food in the Last Year: Never true    Ran Out of Food in the Last Year: Never true  Transportation Needs: No Transportation Needs (01/26/2022)   Received from [employer], [facility]   PRAPARE - Transportation    Lack of Transportation (Medical): No    Lack of Transportation (Non-Medical): No  Physical Activity: Insufficiently Active (11/13/2020)   Received from [facility], [facility]   Exercise Vital Sign    Days of Exercise per Week: 5 days    Minutes of Exercise per Session: 20 min  Stress: Stress Concern Present (11/13/2020)   Received from [facility] of Occupational Health - Occupational Stress Questionnaire    Feeling of Stress : Very much  Social Connections: Unknown (05/07/2021)   Received from [facility], [facility]   Social Network    Social Network: Not on file   Past Surgical History:  Procedure Laterality Date   HERNIA REPAIR     x 2   INCISIONAL HERNIA REPAIR N/A 09/02/2019   Procedure: LAPAROSCOPIC INCISIONAL HERNIA REPAIR WITH MESH;  Surgeon: Kinsinger, De Blanch, MD;  Location: [facility];  Service: General;  Laterality: N/A;   LAPAROSCOPY N/A 12/29/2018   Procedure: LAPAROSCOPY DIAGNOSTIC;  Surgeon: Romualdo Bolk, MD;  Location:  [facility];  Service: Gynecology;  Laterality: N/A;  1 hour surgery time/ follow Dr Rica Records case   LEFT OOPHORECTOMY     OVARIAN CYST REMOVAL Left 12/29/2018   Procedure: LAPARASCOPIC OOPHARECTOMY;  Surgeon: Romualdo Bolk, MD;  Location: [facility];  Service: Gynecology;  Laterality: Left;   WISDOM TOOTH EXTRACTION     Past Surgical History:  Procedure Laterality Date   HERNIA REPAIR     x 2   INCISIONAL HERNIA REPAIR N/A 09/02/2019   Procedure: LAPAROSCOPIC INCISIONAL HERNIA REPAIR WITH MESH;  Surgeon: Kinsinger, De Blanch, MD;  Location: [facility];  Service: General;  Laterality: N/A;   LAPAROSCOPY N/A 12/29/2018  Procedure: LAPAROSCOPY DIAGNOSTIC;  Surgeon: Romualdo Bolk, MD;  Location: [facility];  Service: Gynecology;  Laterality: N/A;  1 hour surgery time/ follow Dr Rica Records case   LEFT OOPHORECTOMY     OVARIAN CYST REMOVAL Left 12/29/2018   Procedure: LAPARASCOPIC OOPHARECTOMY;  Surgeon: Romualdo Bolk, MD;  Location: [facility];  Service: Gynecology;  Laterality: Left;   WISDOM TOOTH EXTRACTION     Past Medical History:  Diagnosis Date   ADHD    Allergy    Alopecia    Anxiety    Bronchitis    Celiac disease    Complication of anesthesia    intubation bronchitis   GERD (gastroesophageal reflux disease)    rare   Hemorrhoids    Immune deficiency disorder (HCC)    Insomnia    Major depression    Migraine with aura    Panic attack    with surgery   PCOS (polycystic ovarian syndrome)    PONV (postoperative nausea and vomiting)    Tinnitus    Umbilical hernia    BP 102/63 (BP Location: Left Arm, Patient Position: Sitting, Cuff Size: Normal)   Pulse 90   Wt 161 lb 3.2 oz (73.1 kg)   SpO2 98%   BMI 21.86 kg/m   Opioid Risk Score:   Fall Risk Score:  `1  Depression screen PHQ 2/9     04/05/2023    3:24 PM 12/14/2022    2:54 PM 07/13/2022    3:43 PM 06/27/2022   10:05 AM 05/11/2022     3:20 PM 03/16/2022    2:51 PM 01/09/2022    9:55 AM  Depression screen PHQ 2/9  Decreased Interest 1 2 0  0 0 0  Down, Depressed, Hopeless 1 2 0  0 0 0  PHQ - 2 Score 2 4 0  0 0 0  Altered sleeping 3        Tired, decreased energy 3        Change in appetite 2        Feeling bad or failure about yourself  1        Trouble concentrating 3        Moving slowly or fidgety/restless 0        Suicidal thoughts 0        PHQ-9 Score 14        Difficult doing work/chores            Information is confidential and restricted. Go to Review Flowsheets to unlock data.     Review of Systems     Objective:   Physical Exam  P Gen: no distress, normal appearing HEENT: oral mucosa pink and moist, NCAT Chest: normal effort, normal rate of breathing Abd: soft, non-distended Ext: no edema Psych: pleasant, normal affect Skin: intact Neuro: Awake and alert, follows commands, cranial nerves II through XII grossly intact sensation intact in all 4 extremities Moving all extremities to gravity and resistance Musculoskeletal: Mild TTP L-spine paraspinal muscles, periscapular muscles, highs, shoulders      Head CT 06/12/21 No acute intracranial abnormality.    MRI spine 04/03/21 IMPRESSION: 1. Normal MRI appearance of the spinal cord. No cord signal changes to suggest myelopathy. No abnormal enhancement. 2. Mild degenerative disc disease at C3-4 through C6-7, with additional mild noncompressive disc bulging at L2-3. No significant stenosis or neural impingement. 3. Mild facet hypertrophy at L3-4 and L4-5.       Assessment & Plan:  Fibromyalgia, improved after TMS -Decrease duloxetine to 30 mg daily for 2 weeks, then 20mg  for 1 week then Discontinue  -Patient discontinued Lyrica and Cymbalta because her pain was doing better after TMS -Continue zynex device-has been providing benefit -Continue current regimen -Consider low dose naltrexone at a later visit -She reports improvement in her  pain after TMS treatment -Advised theracane -Patient reports poor results with NSAIDs, Tylenol, muscle relaxers and other more conservative treatment.  We discussed medical for acute treatment of intermittent flares of her pain.  Will try tramadol as needed 50 mg twice daily, plan for infrequent use.  Discussed risks and benefit.  She would like to try tramadol but would like to avoid any stronger medications. -Drug screen and pain agreement for consideration of tramadol -ORT moderate   Lumbar and cervical spondylosis, history of SI joint dysfunction -Continue regimen as above -Zynex Cryoheat device- continue   Tremor and muscle spasms in right lower extremity -Neurology workup completed,  MRI C spine, T spine, EEG, EMG/NCS RLE without cause found, -She reports this has improved after TMS   Migraine headache -Continue follow-up with neurology -She reports she is on Lamictal now   Depression and anxiety -Denies HI or SI -She is currently following with psychiatry -Had TMS completed with benefit

## 2023-04-05 NOTE — Progress Notes (Deleted)
 Pain Inventory Average Pain 2 Pain Right Now 1 My pain is intermittent, burning, stabbing, and aching  In the last 24 hours, has pain interfered with the following? General activity 2 Relation with others 0 Enjoyment of life 0 What TIME of day is your pain at its worst? morning , daytime, evening, and night Sleep (in general) Poor  Pain is worse with: walking, bending, and standing Pain improves with: heat/ice Relief from Meds:  n/a  Family History  Problem Relation Age of Onset   Colon cancer Maternal Grandfather        passed away 44   Healthy Mother    Healthy Father    Asthma Sister    Factor V Leiden deficiency Sister    Ovarian cancer Paternal Grandmother    Social History   Socioeconomic History   Marital status: Married    Spouse name: Not on file   Number of children: Not on file   Years of education: Not on file   Highest education level: Not on file  Occupational History   Not on file  Tobacco Use   Smoking status: Never   Smokeless tobacco: Never  Vaping Use   Vaping status: Some Days   Substances: Nicotine, Flavoring  Substance and Sexual Activity   Alcohol use: Yes    Comment: socially   Drug use: Never   Sexual activity: Yes    Birth control/protection: Pill  Other Topics Concern   Not on file  Social History Narrative   Not on file   Social Drivers of Health   Financial Resource Strain: Low Risk  (01/26/2022)   Received from [facility], [facility]   Overall Financial Resource Strain (CARDIA)    Difficulty of Paying Living Expenses: Not very hard  Food Insecurity: No Food Insecurity (01/26/2022)   Received from [facility], [facility]   Hunger Vital Sign    Worried About Running Out of Food in the Last Year: Never true    Ran Out of Food in the Last Year: Never true  Transportation Needs: No Transportation Needs (01/26/2022)   Received from [employer], [facility]   PRAPARE - Transportation    Lack of Transportation  (Medical): No    Lack of Transportation (Non-Medical): No  Physical Activity: Insufficiently Active (11/13/2020)   Received from [facility], [facility]   Exercise Vital Sign    Days of Exercise per Week: 5 days    Minutes of Exercise per Session: 20 min  Stress: Stress Concern Present (11/13/2020)   Received from [facility], [facility] of Occupational Health - Occupational Stress Questionnaire    Feeling of Stress : Very much  Social Connections: Unknown (05/07/2021)   Received from [facility], [facility]   Social Network    Social Network: Not on file   Past Surgical History:  Procedure Laterality Date   HERNIA REPAIR     x 2   INCISIONAL HERNIA REPAIR N/A 09/02/2019   Procedure: LAPAROSCOPIC INCISIONAL HERNIA REPAIR WITH MESH;  Surgeon: Kinsinger, De Blanch, MD;  Location: [facility];  Service: General;  Laterality: N/A;   LAPAROSCOPY N/A 12/29/2018   Procedure: LAPAROSCOPY DIAGNOSTIC;  Surgeon: Romualdo Bolk, MD;  Location: [facility];  Service: Gynecology;  Laterality: N/A;  1 hour surgery time/ follow Dr Rica Records case   LEFT OOPHORECTOMY     OVARIAN CYST REMOVAL Left 12/29/2018   Procedure: LAPARASCOPIC OOPHARECTOMY;  Surgeon: Romualdo Bolk, MD;  Location:  [facility];  Service: Gynecology;  Laterality: Left;   WISDOM TOOTH EXTRACTION     Past Surgical History:  Procedure Laterality Date   HERNIA REPAIR     x 2   INCISIONAL HERNIA REPAIR N/A 09/02/2019   Procedure: LAPAROSCOPIC INCISIONAL HERNIA REPAIR WITH MESH;  Surgeon: Kinsinger, De Blanch, MD;  Location: [facility];  Service: General;  Laterality: N/A;   LAPAROSCOPY N/A 12/29/2018   Procedure: LAPAROSCOPY DIAGNOSTIC;  Surgeon: Romualdo Bolk, MD;  Location: [facility];  Service: Gynecology;  Laterality: N/A;  1 hour surgery time/ follow Dr Rica Records case   LEFT OOPHORECTOMY     OVARIAN CYST REMOVAL Left 12/29/2018   Procedure:  LAPARASCOPIC OOPHARECTOMY;  Surgeon: Romualdo Bolk, MD;  Location: [facility];  Service: Gynecology;  Laterality: Left;   WISDOM TOOTH EXTRACTION     Past Medical History:  Diagnosis Date   ADHD    Allergy    Alopecia    Anxiety    Bronchitis    Celiac disease    Complication of anesthesia    intubation bronchitis   GERD (gastroesophageal reflux disease)    rare   Hemorrhoids    Immune deficiency disorder (HCC)    Insomnia    Major depression    Migraine with aura    Panic attack    with surgery   PCOS (polycystic ovarian syndrome)    PONV (postoperative nausea and vomiting)    Tinnitus    Umbilical hernia    There were no vitals taken for this visit.  Opioid Risk Score:   Fall Risk Score:  `1  Depression screen PHQ 2/9     12/14/2022    2:54 PM 07/13/2022    3:43 PM 06/27/2022   10:05 AM 05/11/2022    3:20 PM 03/16/2022    2:51 PM 01/09/2022    9:55 AM 11/23/2021    2:06 PM  Depression screen PHQ 2/9  Decreased Interest 2 0  0 0 0 3  Down, Depressed, Hopeless 2 0  0 0 0 2  PHQ - 2 Score 4 0  0 0 0 5  Altered sleeping       3  Tired, decreased energy       3  Change in appetite       1  Feeling bad or failure about yourself        1  Trouble concentrating       0  Moving slowly or fidgety/restless       3  Suicidal thoughts       0  PHQ-9 Score       16  Difficult doing work/chores       Very difficult     Information is confidential and restricted. Go to Review Flowsheets to unlock data.

## 2023-04-09 LAB — DRUG TOX MONITOR 1 W/CONF, ORAL FLD
Amphetamines: NEGATIVE ng/mL (ref <10)
Barbiturates: NEGATIVE ng/mL (ref <10)
Benzodiazepines: NEGATIVE ng/mL (ref <0.50)
Buprenorphine: NEGATIVE ng/mL (ref <0.10)
Cocaine: NEGATIVE ng/mL (ref <5.0)
Cotinine: 109.3 ng/mL — ABNORMAL HIGH (ref <5.0)
Fentanyl: NEGATIVE ng/mL (ref <0.10)
Heroin Metabolite: NEGATIVE ng/mL (ref <1.0)
MARIJUANA: NEGATIVE ng/mL (ref <2.5)
MDMA: NEGATIVE ng/mL (ref <10)
Meprobamate: NEGATIVE ng/mL (ref <2.5)
Methadone: NEGATIVE ng/mL (ref <5.0)
Nicotine Metabolite: POSITIVE ng/mL — AB (ref <5.0)
Opiates: NEGATIVE ng/mL (ref <2.5)
Phencyclidine: NEGATIVE ng/mL (ref <10)
Tapentadol: NEGATIVE ng/mL (ref <5.0)
Tramadol: NEGATIVE ng/mL (ref <5.0)
Zolpidem: NEGATIVE ng/mL (ref <5.0)

## 2023-04-09 LAB — DRUG TOX ALC METAB W/CON, ORAL FLD: Alcohol Metabolite: NEGATIVE ng/mL (ref <25)

## 2023-04-11 ENCOUNTER — Ambulatory Visit (HOSPITAL_COMMUNITY): Admitting: Psychiatry

## 2023-04-11 ENCOUNTER — Other Ambulatory Visit: Payer: Self-pay

## 2023-04-11 ENCOUNTER — Encounter (HOSPITAL_COMMUNITY): Payer: Self-pay | Admitting: Psychiatry

## 2023-04-11 VITALS — BP 124/73 | Ht 66.0 in | Wt 160.0 lb

## 2023-04-11 DIAGNOSIS — F902 Attention-deficit hyperactivity disorder, combined type: Secondary | ICD-10-CM | POA: Diagnosis not present

## 2023-04-11 DIAGNOSIS — F331 Major depressive disorder, recurrent, moderate: Secondary | ICD-10-CM

## 2023-04-11 DIAGNOSIS — F4310 Post-traumatic stress disorder, unspecified: Secondary | ICD-10-CM

## 2023-04-11 DIAGNOSIS — F840 Autistic disorder: Secondary | ICD-10-CM | POA: Diagnosis not present

## 2023-04-11 MED ORDER — LAMOTRIGINE 150 MG PO TABS: 150.0000 mg | ORAL_TABLET | Freq: Every day | ORAL | 1 refills | Status: DC

## 2023-04-11 MED ORDER — ATOMOXETINE HCL 60 MG PO CAPS: 60.0000 mg | ORAL_CAPSULE | Freq: Two times a day (BID) | ORAL | 1 refills | Status: DC

## 2023-04-11 MED ORDER — SERTRALINE HCL 25 MG PO TABS: 75.0000 mg | ORAL_TABLET | Freq: Every day | ORAL | 1 refills | Status: DC
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
## 2023-04-18 ENCOUNTER — Telehealth: Payer: Self-pay | Admitting: Physical Medicine & Rehabilitation

## 2023-04-18 MED ORDER — TRAMADOL HCL 50 MG PO TABS: 50.0000 mg | ORAL_TABLET | Freq: Two times a day (BID) | ORAL | 0 refills | Status: AC | PRN

## 2023-04-18 NOTE — Telephone Encounter (Signed)
 P called and stated a medication was supposed to be called in for her flare ups. When would that be sent in?

## 2023-05-17 ENCOUNTER — Other Ambulatory Visit (HOSPITAL_COMMUNITY): Payer: Self-pay | Admitting: Psychiatry

## 2023-05-17 DIAGNOSIS — F840 Autistic disorder: Secondary | ICD-10-CM

## 2023-05-17 DIAGNOSIS — F4310 Post-traumatic stress disorder, unspecified: Secondary | ICD-10-CM

## 2023-05-17 DIAGNOSIS — F902 Attention-deficit hyperactivity disorder, combined type: Secondary | ICD-10-CM

## 2023-05-17 DIAGNOSIS — F331 Major depressive disorder, recurrent, moderate: Secondary | ICD-10-CM

## 2023-06-07 ENCOUNTER — Encounter: Attending: Physical Medicine & Rehabilitation | Admitting: Physical Medicine & Rehabilitation

## 2023-06-07 ENCOUNTER — Encounter: Payer: Self-pay | Admitting: Physical Medicine & Rehabilitation

## 2023-06-07 VITALS — BP 107/69 | HR 92 | Ht 66.0 in | Wt 163.0 lb

## 2023-06-07 DIAGNOSIS — M797 Fibromyalgia: Secondary | ICD-10-CM | POA: Diagnosis present

## 2023-06-07 DIAGNOSIS — M2550 Pain in unspecified joint: Secondary | ICD-10-CM | POA: Insufficient documentation

## 2023-06-07 DIAGNOSIS — G894 Chronic pain syndrome: Secondary | ICD-10-CM | POA: Insufficient documentation

## 2023-06-07 MED ORDER — PREGABALIN 75 MG PO CAPS: 75.0000 mg | ORAL_CAPSULE | Freq: Three times a day (TID) | ORAL | 3 refills | Status: DC

## 2023-06-07 NOTE — Progress Notes (Signed)
 Subjective:    Patient ID: Charlene Griffin, female    DOB: November 17, 1990, 33 y.o.   MRN: [id number]  HPI  HPI 12/14/22 Charlene Griffin is a 33 y.o. year old female  who  has a past medical history of ADHD, Allergy, Alopecia, Anxiety, Bronchitis, Celiac disease, Complication of anesthesia, GERD (gastroesophageal reflux disease), Hemorrhoids, Immune deficiency disorder (HCC), Insomnia, Major depression, Migraine with aura, Panic attack, PCOS (polycystic ovarian syndrome), PONV (postoperative nausea and vomiting), Tinnitus, and Umbilical hernia.   She has been diagnosed with fibromyalgia.  They are presenting to PM&R clinic as a new patient for pain management evaluation.  She reports she has had migraines for many years.  About 2 years ago she developed pain in her lower back and hips.  Pain is sometimes worse on the left and other times worse on the right.  Pain is worsened by any type of activity.  She is unable to work consistently due to her chronic pain.  Pain is worse when the weather changes.  She is also developed a tremor that is worse in her right lower extremity.  Tremor is worse when pain is most severe.  She reports she has had this since the beginning of the year.  She reports a addiction history in her family and would like to avoid opioid medications.  She reports poor sleep.  Chronic anxiety and depression.  No SI or HI.  Reports a lot of stress due to her pain and not being able to work.  Prior constipation but this improved after she stopped eating gluten. She is currently on birth control and not try to get pregnant.  She may consider getting pregnant in the future.  She reports she felt the best when she was in a warm dry climate.  She reports the pain is so severe it will cause her to vomit.  She reports muscle cramping in her legs.  Denies bowel or bladder incontinence.   Interval History by phone call 12/14/22  Pt seen by phone visit today, video visit scheduled but she had  computer issues. Pain and spasms much improved since TMS. Pain is mostly below 5/10. Once a month it will go to 8/10. She would like to wean down Cymbalta  and stop this medication.  She has been started on sertraline .  She is also interested in trying to wean down lyrica .    Interval history 04/05/2023 Patient reports that she has been doing overall well since completing TMS.  She feels like her pain continues to be improved since this time.  She is still working with physical therapy through [facility], getting dry needling as part of this.  She will intermittently get pain flares about 2 times a month where she has worsening shoulder and hip pain.  Other days pain is fairly well-controlled.  Pain often occurs in her shoulders and hips and she will have occasional reoccurrence of right leg twitching.  She has weaned off Lyrica  and Cymbalta .  Interval history 06/07/23 She has been having more frequent flareups of her pain since her last visit.  Pain is still improved since TMS but not as good it was initially.  She says she could repeat TMS but this requires daily visits for several weeks.  She continues to work with physical therapy on stretching, strengthening exercises and dry needling.  She will occasionally get a tremor in her right leg.  Her pain currently is in her hips, lower back, shoulders right greater than left.  She will have pain in her extremities, this can be in different extremities on different days.   Pain Inventory Average Pain 3 Pain Right Now 6 My pain is intermittent, burning, dull, stabbing, and aching  In the last 24 hours, has pain interfered with the following? General activity 4 Relation with others 1 Enjoyment of life 4 What TIME of day is your pain at its worst? night Sleep (in general) Poor  Pain is worse with: walking, bending, standing, and some activites Pain improves with: therapy/exercise and TENS Relief from Meds: 0  Family History  Problem Relation Age of  Onset   Colon cancer Maternal Grandfather        passed away 74   Healthy Mother    Healthy Father    Asthma Sister    Factor V Leiden deficiency Sister    Ovarian cancer Paternal Grandmother    Social History   Socioeconomic History   Marital status: Married    Spouse name: Not on file   Number of children: Not on file   Years of education: Not on file   Highest education level: Not on file  Occupational History   Not on file  Tobacco Use   Smoking status: Never   Smokeless tobacco: Never  Vaping Use   Vaping status: Some Days   Substances: Nicotine, Flavoring  Substance and Sexual Activity   Alcohol use: Yes    Comment: socially   Drug use: Never   Sexual activity: Yes    Birth control/protection: Pill  Other Topics Concern   Not on file  Social History Narrative   Not on file   Social Drivers of Health   Financial Resource Strain: Low Risk  (01/26/2022)   Received from [facility], [facility]   Overall Financial Resource Strain (CARDIA)    Difficulty of Paying Living Expenses: Not very hard  Food Insecurity: No Food Insecurity (01/26/2022)   Received from [facility], [facility]   Hunger Vital Sign    Worried About Running Out of Food in the Last Year: Never true    Ran Out of Food in the Last Year: Never true  Transportation Needs: No Transportation Needs (01/26/2022)   Received from [employer], [facility]   PRAPARE - Transportation    Lack of Transportation (Medical): No    Lack of Transportation (Non-Medical): No  Physical Activity: Insufficiently Active (11/13/2020)   Received from [facility], [facility]   Exercise Vital Sign    Days of Exercise per Week: 5 days    Minutes of Exercise per Session: 20 min  Stress: Stress Concern Present (11/13/2020)   Received from [facility], [facility] - Occupational Stress Questionnaire    Feeling of Stress : Very much  Social Connections: Unknown  (05/07/2021)   Received from [facility], [facility]   Social Network    Social Network: Not on file   Past Surgical History:  Procedure Laterality Date   HERNIA REPAIR     x 2   INCISIONAL HERNIA REPAIR N/A 09/02/2019   Procedure: LAPAROSCOPIC INCISIONAL HERNIA REPAIR WITH MESH;  Surgeon: Kinsinger, Alphonso Aschoff, MD;  Location: [facility];  Service: General;  Laterality: N/A;   LAPAROSCOPY N/A 12/29/2018   Procedure: LAPAROSCOPY DIAGNOSTIC;  Surgeon: Wanita Gutta, MD;  Location: [facility];  Service: Gynecology;  Laterality: N/A;  1 hour surgery time/ follow Dr Newt Barefoot case   LEFT OOPHORECTOMY  OVARIAN CYST REMOVAL Left 12/29/2018   Procedure: LAPARASCOPIC OOPHARECTOMY;  Surgeon: Wanita Gutta, MD;  Location: [facility];  Service: Gynecology;  Laterality: Left;   WISDOM TOOTH EXTRACTION     Past Surgical History:  Procedure Laterality Date   HERNIA REPAIR     x 2   INCISIONAL HERNIA REPAIR N/A 09/02/2019   Procedure: LAPAROSCOPIC INCISIONAL HERNIA REPAIR WITH MESH;  Surgeon: Kinsinger, Alphonso Aschoff, MD;  Location: [facility];  Service: General;  Laterality: N/A;   LAPAROSCOPY N/A 12/29/2018   Procedure: LAPAROSCOPY DIAGNOSTIC;  Surgeon: Wanita Gutta, MD;  Location: [facility];  Service: Gynecology;  Laterality: N/A;  1 hour surgery time/ follow Dr Newt Barefoot case   LEFT OOPHORECTOMY     OVARIAN CYST REMOVAL Left 12/29/2018   Procedure: LAPARASCOPIC OOPHARECTOMY;  Surgeon: Wanita Gutta, MD;  Location: [facility];  Service: Gynecology;  Laterality: Left;   WISDOM TOOTH EXTRACTION     Past Medical History:  Diagnosis Date   ADHD    Allergy    Alopecia    Anxiety    Bronchitis    Celiac disease    Complication of anesthesia    intubation bronchitis   GERD (gastroesophageal reflux disease)    rare   Hemorrhoids    Immune deficiency disorder (HCC)    Insomnia    Major depression    Migraine  with aura    Panic attack    with surgery   PCOS (polycystic ovarian syndrome)    PONV (postoperative nausea and vomiting)    Tinnitus    Umbilical hernia    Ht 5\' 6"  (1.676 m)   Wt 163 lb (73.9 kg)   BMI 26.31 kg/m   Opioid Risk Score:   Fall Risk Score:  `1  Depression screen PHQ 2/9     04/05/2023    3:24 PM 12/14/2022    2:54 PM 07/13/2022    3:43 PM 06/27/2022   10:05 AM 05/11/2022    3:20 PM 03/16/2022    2:51 PM 01/09/2022    9:55 AM  Depression screen PHQ 2/9  Decreased Interest 1 2 0  0 0 0  Down, Depressed, Hopeless 1 2 0  0 0 0  PHQ - 2 Score 2 4 0  0 0 0  Altered sleeping 3        Tired, decreased energy 3        Change in appetite 2        Feeling bad or failure about yourself  1        Trouble concentrating 3        Moving slowly or fidgety/restless 0        Suicidal thoughts 0        PHQ-9 Score 14        Difficult doing work/chores            Information is confidential and restricted. Go to Review Flowsheets to unlock data.     Review of Systems  Musculoskeletal:  Positive for back pain.       Right shoulder pain,  pain in both hips  All other systems reviewed and are negative.      Objective:   Physical Exam  P Gen: no distress, normal appearing HEENT: oral mucosa pink and moist, NCAT Chest: normal effort, normal rate of breathing Abd: soft, non-distended Ext: no edema Psych: pleasant, normal affect Skin: intact Neuro: Awake and alert, follows commands, cranial  nerves II through XII grossly intact sensation intact in all 4 extremities Moving all extremities to gravity and resistance Musculoskeletal:TTP L-spine paraspinal muscles, periscapular muscles, shoulders right greater than left, parascapular muscles right greater than left, left wrist, bilateral hips Hypermobility noted in her fingers able to get thumb close to her wrist, elbows, hips, knees.    Head CT 06/12/21 No acute intracranial abnormality.    MRI spine 04/03/21 IMPRESSION: 1.  Normal MRI appearance of the spinal cord. No cord signal changes to suggest myelopathy. No abnormal enhancement. 2. Mild degenerative disc disease at C3-4 through C6-7, with additional mild noncompressive disc bulging at L2-3. No significant stenosis or neural impingement. 3. Mild facet hypertrophy at L3-4 and L4-5.       Assessment & Plan:   Fibromyalgia, improved after TMS -She is currently working with PT, could try pool therapy at a later time -Patient discontinued Lyrica  and Cymbalta  because her pain was doing better after TMS -Restart Lyrica  75 mg twice daily, can increase to 3 times daily after few weeks if tolerating -Continue zynex device-has been providing benefit-device has not been working correctly she was advised to try to call the company to discuss how to get it working -Continue current regimen -Consider low dose naltrexone at a later visit- limited by cost  -She reports improvement in her pain after TMS treatment -Advised theracane -Patient reports poor results with NSAIDs, Tylenol , muscle relaxers  -Tramadol  discontinued due to poor benefit -ORT moderate   Lumbar and cervical spondylosis, history of SI joint dysfunction -Continue regimen as above -Zynex Cryoheat device- continue   Tremor and muscle spasms in right lower extremity -Neurology workup completed,  MRI C spine, T spine, EEG, EMG/NCS RLE without cause found, -She reports this has improved after TMS but has been returning to some degree   Migraine headache -Continue follow-up with neurology   Depression and anxiety -Denies HI or SI -She is currently following with psychiatry -Had TMS completed with benefit  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

## 2023-06-11 ENCOUNTER — Emergency Department (HOSPITAL_BASED_OUTPATIENT_CLINIC_OR_DEPARTMENT_OTHER)
Admission: EM | Admit: 2023-06-11 | Discharge: 2023-06-11 | Disposition: A | Discharge status: Home / Self Care | Attending: Emergency Medicine | Admitting: Emergency Medicine

## 2023-06-11 ENCOUNTER — Other Ambulatory Visit: Payer: Self-pay

## 2023-06-11 ENCOUNTER — Emergency Department (HOSPITAL_BASED_OUTPATIENT_CLINIC_OR_DEPARTMENT_OTHER)

## 2023-06-11 DIAGNOSIS — R253 Fasciculation: Secondary | ICD-10-CM | POA: Diagnosis present

## 2023-06-11 DIAGNOSIS — G514 Facial myokymia: Secondary | ICD-10-CM

## 2023-06-11 LAB — COMPREHENSIVE METABOLIC PANEL WITH GFR
ALT: 18 U/L (ref 0–44)
AST: 24 U/L (ref 15–41)
Albumin: 4.9 g/dL (ref 3.5–5.0)
Alkaline Phosphatase: 67 U/L (ref 38–126)
Anion gap: 12 (ref 5–15)
BUN: 10 mg/dL (ref 6–20)
CO2: 25 mmol/L (ref 22–32)
Calcium: 9.5 mg/dL (ref 8.9–10.3)
Chloride: 101 mmol/L (ref 98–111)
Creatinine, Ser: 0.87 mg/dL (ref 0.44–1.00)
GFR, Estimated: >60 mL/min (ref >60)
Glucose, Bld: 88 mg/dL (ref 70–99)
Potassium: 4.1 mmol/L (ref 3.5–5.1)
Sodium: 138 mmol/L (ref 135–145)
Total Bilirubin: 0.6 mg/dL (ref 0.0–1.2)
Total Protein: 7.8 g/dL (ref 6.5–8.1)

## 2023-06-11 LAB — CBC WITH DIFFERENTIAL/PLATELET
Abs Immature Granulocytes: 0 10*3/uL (ref 0.00–0.07)
Basophils Absolute: 0 10*3/uL (ref 0.0–0.1)
Basophils Relative: 0 %
Eosinophils Absolute: 0.1 10*3/uL (ref 0.0–0.5)
Eosinophils Relative: 2 %
HCT: 39.8 % (ref 36.0–46.0)
Hemoglobin: 13.8 g/dL (ref 12.0–15.0)
Immature Granulocytes: 0 %
Lymphocytes Relative: 31 %
Lymphs Abs: 1.8 10*3/uL (ref 0.7–4.0)
MCH: 30.8 pg (ref 26.0–34.0)
MCHC: 34.7 g/dL (ref 30.0–36.0)
MCV: 88.8 fL (ref 80.0–100.0)
Monocytes Absolute: 0.5 10*3/uL (ref 0.1–1.0)
Monocytes Relative: 9 %
Neutro Abs: 3.4 10*3/uL (ref 1.7–7.7)
Neutrophils Relative %: 58 %
Platelets: 322 10*3/uL (ref 150–400)
RBC: 4.48 MIL/uL (ref 3.87–5.11)
RDW: 12.8 % (ref 11.5–15.5)
WBC: 5.8 10*3/uL (ref 4.0–10.5)
nRBC: 0 % (ref 0.0–0.2)

## 2023-06-11 LAB — PREGNANCY, URINE: Preg Test, Ur: NEGATIVE

## 2023-06-11 LAB — MAGNESIUM: Magnesium: 2 mg/dL (ref 1.7–2.4)

## 2023-06-11 MED ORDER — LORAZEPAM 1 MG PO TABS
1.0000 mg | ORAL_TABLET | Freq: Once | ORAL | Status: AC
  Administered 2023-06-11: 1 mg via ORAL
  Filled 2023-06-11: qty 1

## 2023-06-11 MED ORDER — LORAZEPAM 2 MG/ML IJ SOLN
1.0000 mg | Freq: Once | INTRAMUSCULAR | Status: AC
  Administered 2023-06-11: 1 mg via INTRAVENOUS
  Filled 2023-06-11: qty 1

## 2023-06-11 MED ORDER — CYCLOBENZAPRINE HCL 10 MG PO TABS: 10.0000 mg | ORAL_TABLET | Freq: Two times a day (BID) | ORAL | 0 refills | Status: DC | PRN

## 2023-06-11 MED ORDER — CYCLOBENZAPRINE HCL 5 MG PO TABS
5.0000 mg | ORAL_TABLET | Freq: Once | ORAL | Status: AC
  Administered 2023-06-11: 5 mg via ORAL
  Filled 2023-06-11: qty 1

## 2023-06-11 NOTE — Consult Note (Signed)
 [facility] Tele[facility]urology Consult [facility]  Stat Consult  Patient Name:   Charlene Griffin, Charlene Griffin Date of Birth:   June 21, 1990 Identification Number:   MRN - [id number] Date of Service:   06/11/2023 20:20:09  Diagnosis:       R25.3 - Twitching  Impression 33 y/o woman with fibromyalgia presented with L and R facial grimacing movements that are distractable and appear to be functional. Exam and CT otherwise unremarkable. No further neurological workup indicated at this point. Follow up with PCP and recommend outpatient neurology.   Recommendations: Our recommendations are outlined below.  Dispositions : Outpatient Neurology follow up in 3-6 weeks    ----------------------------------------------------------------------------------------------------    Metrics: Dispatch Time: 06/11/2023 20:17:39 Callback Response Time: 06/11/2023 20:20:31  Primary Provider Notified of Diagnostic Impression and Management Plan on: 06/11/2023 20:49:59     ----------------------------------------------------------------------------------------------------  Chief Complaint: twitching  History of Present Illness: Patient is a 33 year old Female. 63F with fibromyalgia presented with uncontrolled bilateral facial grimacing that started last night. This morning when she woke up she had nonstop left face grimacing/twitching. In the ER, patient received Ativan  IV and PO which improved the symptoms.   Past Medical History:      Migraine Headaches Other PMH:  Fibromyalgia  Medications:  No Anticoagulant use  No Antiplatelet use Reviewed EMR for current medications  Allergies:  Reviewed  Social History: Smoking: No  Family History:  There is no family history of premature cerebrovascular disease pertinent to this consultation  ROS : 14 Points Review of Systems was performed and was negative except mentioned in HPI.  Past Surgical History: There Is No Surgical  History Contributory To Today's Visit   Examination: BP(109/62), Pulse(77),  Neuro Exam: General: Alert,Awake, Oriented to Time, Place, Person  Speech: Fluent:  Language: Intact:  Face: Symmetric:  Extraocular Movements: Intact:  Motor Exam: No Drift:  Spoke with : Dr. Artemus Larsen    This consult was conducted in real time using interactive audio and Immunologist. Patient was informed of the technology being used for this visit and agreed to proceed. Patient located in hospital and provider located at home/office setting.  Patient is being evaluated for possible acute neurologic impairment and high probability of imminent or life - threatening deterioration.I spent total of 35 minutes providing care to this patient, including time for face to face visit via telemedicine, review of medical records, imaging studies and discussion of findings with providers, the patient and / or family.   Dr Caroll Cipro   [facility] For Inpatient follow-up with [facility] physician please call [facility] at [phone]. As we are not an outpatient service for any post hospital discharge needs please contact the hospital for assistance.  If you have any questions for the [facility] physicians or need to reconsult for clinical or diagnostic changes please contact us  via [facility] at [phone].

## 2023-06-11 NOTE — ED Triage Notes (Signed)
 Pt POV steady gait- c/o facial twitching, started with the L and now it just keeps switching. Pt speaking in full sentences, respirations equal and unlabored.   No med changes recently. Zoloft  last increased appx 60 days ago. No recent vaccinations

## 2023-06-11 NOTE — Discharge Instructions (Signed)
 Please follow-up with neurology outpatient.  Your workup was overall reassuring and you were evaluated by a teleneurology specialist in the emergency department.  Flexeril has been prescribed which is a muscle relaxant, do not take this medication and then operate heavy machinery.

## 2023-06-11 NOTE — ED Provider Notes (Signed)
 [place] [facility] Provider Note   CSN: [id number] Arrival date & time: 06/11/23  1434     History  Chief Complaint  Patient presents with   Spasms    Charlene Griffin is a 33 y.o. female.  HPI   33 year old female presenting to the emergency department with bilateral facial twitching.  The patient states that it started on the left and now is on the right is well.  She denies any history of this.  She denies any other neurologic deficits.  She had her home Zoloft  increased approximately 60 days ago but denies any recent medication changes.  No history of electrolyte disturbance.  Have a history of migraine with aura, anxiety, ADHD, bilateral muscle spasms of the lower extremities.  No fevers or chills. No hx of seizures.  Home Medications Prior to Admission medications   Medication Sig Start Date End Date Taking? Authorizing Provider  cyclobenzaprine (FLEXERIL) 10 MG tablet Take 1 tablet (10 mg total) by mouth 2 (two) times daily as needed for muscle spasms. 06/11/23  Yes Rosealee Concha, MD  albuterol  (PROVENTIL ) (2.5 MG/3ML) 0.083% nebulizer solution Inhale into the lungs as needed. 01/19/20   [provider]  atomoxetine  (STRATTERA ) 60 MG capsule Take 1 capsule (60 mg total) by mouth 2 (two) times daily with a meal. 04/11/23 06/10/23  Arfeen, Bronson Canny, MD  hydrocortisone  (ANUSOL -HC) 25 MG suppository Place 25 mg rectally. 03/29/23 03/28/24  [provider]  lamoTRIgine  (LAMICTAL ) 150 MG tablet Take 1 tablet (150 mg total) by mouth daily. 04/11/23 06/10/23  Arfeen, Bronson Canny, MD  montelukast  (SINGULAIR ) 10 MG tablet Take 1 tablet (10 mg total) by mouth at bedtime. 11/08/20   Eloise Hake Scales, PA-C  ondansetron  (ZOFRAN -ODT) 4 MG disintegrating tablet Take 2 mg by mouth every 6 (six) hours as needed. 09/13/22   [provider]  pregabalin  (LYRICA ) 75 MG capsule Take 1 capsule (75 mg total) by mouth 3 (three) times daily. Start with  twice a day, if tolerating for 2 weeks, can increase to three times day 06/07/23   Lylia Sand, MD  sertraline  (ZOLOFT ) 25 MG tablet Take 3 tablets (75 mg total) by mouth daily. 04/11/23 06/10/23  Arfeen, Bronson Canny, MD  SLYND 4 MG TABS Take 1 tablet by mouth daily.    [provider]  traMADol  (ULTRAM ) 50 MG tablet Take 1 tablet (50 mg total) by mouth every 12 (twelve) hours as needed. 04/18/23   Lylia Sand, MD      Allergies    Botulinum toxin type a, Onabotulinumtoxina, Diphenhydramine , Diphenhydramine  hcl, Gluten meal, Hydrocodone-acetaminophen , and Onabotulinumtoxina (cosmetic)    Review of Systems   Review of Systems  All other systems reviewed and are negative.   Physical Exam Updated Vital Signs BP 109/62 (BP Location: Right Arm)   Pulse 100   Temp 97.9 F (36.6 C) (Oral)   Resp 16   Ht 5\' 6"  (1.676 m)   Wt 72.6 kg   SpO2 100%   BMI 25.82 kg/m  Physical Exam Vitals and nursing note reviewed.  Constitutional:      General: She is not in acute distress.    Appearance: She is well-developed.  HENT:     Head: Normocephalic and atraumatic.  Eyes:     Conjunctiva/sclera: Conjunctivae normal.  Cardiovascular:     Rate and Rhythm: Normal rate and regular rhythm.  Pulmonary:     Effort: Pulmonary effort is normal. No respiratory distress.  Breath sounds: Normal breath sounds.  Abdominal:     Palpations: Abdomen is soft.     Tenderness: There is no abdominal tenderness.  Musculoskeletal:        General: No swelling.     Cervical back: Neck supple.  Skin:    General: Skin is warm and dry.     Capillary Refill: Capillary refill takes less than 2 seconds.  Neurological:     Mental Status: She is alert.     Comments: Cranial nerves II through XII intact, intact 5 out of 5 strength in all 4 extremities, intact sensation to light touch all 4 extremities.  Intermittent facial spasming bilaterally, seems to resolve when patient is distracted such as when asked  to extend her tongue  Psychiatric:        Mood and Affect: Mood normal.     ED Results / Procedures / Treatments   Labs (all labs ordered are listed, but only abnormal results are displayed) Labs Reviewed  CBC WITH DIFFERENTIAL/PLATELET  COMPREHENSIVE METABOLIC PANEL WITH GFR  MAGNESIUM   PREGNANCY, URINE    EKG None  Radiology CT Head Wo Contrast Result Date: 06/11/2023 CLINICAL DATA:  Facial spasms, left facial twitching. EXAM: CT HEAD WITHOUT CONTRAST TECHNIQUE: Contiguous axial images were obtained from the base of the skull through the vertex without intravenous contrast. RADIATION DOSE REDUCTION: This exam was performed according to the departmental dose-optimization program which includes automated exposure control, adjustment of the mA and/or kV according to patient size and/or use of iterative reconstruction technique. COMPARISON:  11/19/2022 FINDINGS: Brain: No intracranial hemorrhage, mass effect, or midline shift. No hydrocephalus. The basilar cisterns are patent. No evidence of territorial infarct or acute ischemia. No extra-axial or intracranial fluid collection. Vascular: No hyperdense vessel or unexpected calcification. Skull: No fracture or focal lesion. Sinuses/Orbits: Paranasal sinuses and mastoid air cells are clear. The visualized orbits are unremarkable. Other: None. IMPRESSION: Negative noncontrast head CT. Electronically Signed   By: Chadwick Colonel M.D.   On: 06/11/2023 20:35    Procedures Procedures    Medications Ordered in ED Medications  cyclobenzaprine (FLEXERIL) tablet 5 mg (5 mg Oral Given 06/11/23 1720)  LORazepam  (ATIVAN ) tablet 1 mg (1 mg Oral Given 06/11/23 1720)  LORazepam  (ATIVAN ) injection 1 mg (1 mg Intravenous Given 06/11/23 1904)    ED Course/ Medical Decision Making/ A&P                                 Medical Decision Making Amount and/or Complexity of Data Reviewed Labs: ordered. Radiology: ordered.  Risk Prescription drug  management.    33 year old female presenting to the emergency department with bilateral facial twitching.  The patient states that it started on the left and now is on the right is well.  She denies any history of this.  She denies any other neurologic deficits.  She had her home Zoloft  increased approximately 60 days ago but denies any recent medication changes.  No history of electrolyte disturbance.  Have a history of migraine with aura, anxiety, ADHD, bilateral muscle spasms of the lower extremities.  No fevers or chills. No hx of seizures.  On arrival, the patient was afebrile, mildly tachycardic heart rate 102, respiratory rate 16, BP 111/73, initial O2 sat documented 88% however the patient was saturating 100% on room air on my evaluation so I believe this to be a spurious vital documentation.  No generalized seizure activity.  Patient presenting with intermittently bilateral facial twitching, no clear focality noted.  No history of seizures.  No current headache, no fever, no neck stiffness.  Neurologically intact.  Theodosia Fishman evaluation revealed urine pregnancy negative, magnesium  normal, CMP unremarkable, CBC unremarkable.  The patient was administered Ativan  and Flexeril with some improvement.  CT Head to evaluate for intracranial mass:  IMPRESSION:  Negative noncontrast head CT.    Neuro consult: Teleneuro consulted, Dr. Jeane Miguel who felt that the patient's symptoms were likely functional in etiology.  No indication for EEG monitoring at this time.  Will trial a course of Flexeril to see if this helps, advised that the patient not operate heavy machinery while taking the medication.  Outpatient referral for follow-up with neurology provided, return precautions provided in the event any true seizure-like activity.  Stable for discharge and outpatient follow-up.  Final Clinical Impression(s) / ED Diagnoses Final diagnoses:  Facial twitching    Rx / DC Orders ED Discharge Orders           Ordered    cyclobenzaprine (FLEXERIL) 10 MG tablet  2 times daily PRN        06/11/23 2053    Ambulatory referral to Neurology       Comments: An appointment is requested in approximately: 4 weeks   06/11/23 2053              Rosealee Concha, MD 06/11/23 2054

## 2023-06-12 NOTE — Telephone Encounter (Signed)
 Patient has found a place  Engineer, maintenance (IT) at [facility] [address] [place], [place] [address] Phone: [phone] (link: [phone])   With no wait and that could get her in today. She just needs the referral.  The fax number for this place is [phone]

## 2023-06-12 NOTE — Telephone Encounter (Signed)
 Referral placed and reached out to our referral coordinator lisa.

## 2023-06-12 NOTE — Telephone Encounter (Signed)
 FYI

## 2023-06-12 NOTE — Progress Notes (Signed)
 [facility]    Date: 06/12/2023 Patient name: Charlene Griffin Date of birth: Apr 23, 1990   ASSESSMENT/PLAN:  Diagnoses and all orders for this visit: Facial twitching   Assessment & Plan 1. Facial twitching: Acute. - Electrolyte levels, including calcium, were within normal limits. CT scan of the head did not reveal any abnormalities. Neurology suspected to be functional which based on today's exam seems appropriate  reviewed ct and labs  - Use cyclobenzaprine  to alleviate the twitching since it is improving.  - Apply a heating pad to the face to aid in muscle relaxation, with caution advised against prolonged use to prevent skin burns. Warm compresses may also be beneficial. -  she reports being allergic to botox    Follow up if symptoms worsen or fail to improve.    Patient expresses understanding of their current medications and use.  We discussed potential side effects, drug interactions, instructions for taking the medication, and the consequences of not taking it. Patient verbalized an understanding of these instructions. Patient is able to verbalize understanding of the care plan discussed today. Patient's medical and personal goals were discussed today.  Computer technology was used to create visit note. Consent from patient/care giver was obtained prior to its use.   Patient was instructed to return sooner if no improvement, and we discussed precautions to seek emergency medical care if condition worsens.    SUBJECTIVE:  [name] is a 33 y.o. female that presents to clinic today regarding the following issues: Facial Twitching   History of Present Illness The patient presents for evaluation of facial twitching.  She began experiencing facial twitching on Monday at 8:30, which persisted until she fell asleep at 11 PM. The twitching resumed upon waking at 6 AM and continued intermittently. She has documented the episodes on  video, noting that the twitching alternates between both sides of her face and are like a snarl pulling her lip and cheek up. A recent visit to the emergency room yesterday resulted in a CT scan, which was interpreted as normal. Labs were normal. A virtual consultation with a neurologist suggested the twitching was functional and recommended outpatient f/u. She was prescribed cyclobenzaprine , which she reports has reduced the frequency of the twitching. Currently, the twitching is localized to the right side of her face, specifically around the eye. It is much better and less frequent today than yesterday.   She reports no facial pain or leg/arm weakness. No seizure like activity. She says the ER told her to f/u with her PCP   She is under the care of a psychiatrist and psychologist for anxiety, which she describes as fluctuating.       Reviewed and updated this visit by provider: Tobacco  Allergies  Meds  Problems  Med Hx  Surg Hx  Fam Hx        Medications Patient's Medications  New Prescriptions   No medications on file  Previous Medications   ALBUTEROL  SULFATE HFA (PROVENTIL ,VENTOLIN ,PROAIR ) 108 (90 BASE) MCG/ACT INHALER    Inhale two puffs into the lungs every 6 (six) hours as needed for Wheezing.   ARIPIPRAZOLE  (ABILIFY ) 2 MG TABLET    Take one tablet (2 mg dose) by mouth daily.   ATOMOXETINE  (STRATTERA ) 40 MG CAPSULE    Take one capsule (40 mg dose) by mouth 2 (two) times a day with meals.   CYCLOBENZAPRINE  (FLEXERIL ) 10 MG TABLET    Take one tablet (10 mg dose) by mouth 2 (  two) times a day as needed.   DICYCLOMINE (BENTYL) 20 MG TABLET    Take one tablet (20 mg dose) by mouth 2 (two) times daily for 30 days.   GABAPENTIN  (NEURONTIN ) 300 MG CAPSULE    Take two capsules (600 mg dose) by mouth at bedtime for 30 days.   LAMOTRIGINE  (LAMICTAL ) 150 MG TABLET    Take one tablet (150 mg dose) by mouth daily.   MISC. DEVICES MISC    Use of cane to aid in ambulating as needed.    MONTELUKAST  (SINGULAIR ) 10 MG TABLET    Take one tablet (10 mg dose) by mouth as needed.   PREGABALIN  (LYRICA ) 75 MG CAPSULE    Take one capsule (75 mg dose) by mouth 3 (three) times a day.   SERTRALINE  (ZOLOFT ) 50 MG TABLET    Take one tablet (50 mg dose) by mouth daily.   SLYND 4 MG TABS    Take one tablet (4 mg dose) by mouth daily.  Modified Medications   No medications on file  Discontinued Medications   No medications on file      ROS  Review of Systems  Constitutional: Negative.   HENT: Negative.    Eyes: Negative.   Respiratory: Negative.    Cardiovascular: Negative.   Gastrointestinal: Negative.   Endocrine: Negative.   Genitourinary: Negative.   Musculoskeletal: Negative.   Skin: Negative.   Allergic/Immunologic: Negative.   Neurological:        Facial twitching  Hematological: Negative.   Psychiatric/Behavioral: Negative.       Review of Systems - All other systems reviewed are negative except as noted above.   OBJECTIVE:  Vitals BP 124/82 (BP Location: Right Upper Arm, Patient Position: Sitting)   Pulse 100   Temp 97.1 F (36.2 C) (Temporal)   Resp 18   Ht 5' 6 (1.676 m)   Wt 161 lb 9.6 oz (73.3 kg)   SpO2 99%   Breastfeeding No   BMI 26.08 kg/m    PHYSICAL:  General Appearance: Alert, oriented and appears in no acute distress. Sitting comfortably in chair. Head: Normocephalic, without obvious abnormality, atraumatic.  Eyes: Pupils equal, round, reactive to light. EOM intact in all directions. HENT: Oral mucosa moist without erythema or exudates. Nasal turbinates without erythema, edema or purulent rhinorrhea. TMs intact bilaterally, without erythema, effusion or exudate. Neck: Supple, no adenopathy Respiratory: Good air movement throughout. No accessory muscle use, increased work of breathing or respiratory distress. Clear to auscultation in all lung fields. Normal effort of breathing, non-labored. No adventitious lung sounds including wheezes,  rhonchi or crackles. Cardiovascular: Regular rate and rhythm, S1, S2 normal, no murmur. No heaves, lifts or thrills palpable. Musculoskeletal:  Normal ROM and strength 5/5 upper and lower extremities blt.      Extremities: No peripheral edema.  Skin: Warm, dry.  Psychiatric: Pleasant. Mood and affect normal. Not anxious or tearful. Neurological: AOx3. Gait normal. No twitching noted until patient got on to exam table and right cheek / lip pulled up and stayed contracted for about 15 seconds and patient's face relaxed easily. Fine motor movements in tact. Cranial nerves grossly intact     Joesph HILARIO Cedar, PA-C  [facility] [place]  06/12/2023 12:55 PM

## 2023-06-14 ENCOUNTER — Other Ambulatory Visit (HOSPITAL_COMMUNITY): Payer: Self-pay | Admitting: Psychiatry

## 2023-06-14 ENCOUNTER — Other Ambulatory Visit (HOSPITAL_COMMUNITY): Payer: Self-pay

## 2023-06-14 DIAGNOSIS — F840 Autistic disorder: Secondary | ICD-10-CM

## 2023-06-14 DIAGNOSIS — F331 Major depressive disorder, recurrent, moderate: Secondary | ICD-10-CM

## 2023-06-14 DIAGNOSIS — F902 Attention-deficit hyperactivity disorder, combined type: Secondary | ICD-10-CM

## 2023-06-14 DIAGNOSIS — F4310 Post-traumatic stress disorder, unspecified: Secondary | ICD-10-CM

## 2023-06-14 MED ORDER — ATOMOXETINE HCL 60 MG PO CAPS
60.0000 mg | ORAL_CAPSULE | Freq: Two times a day (BID) | ORAL | 0 refills | Status: DC
Start: 2023-06-14 — End: 2023-07-26

## 2023-06-14 MED ORDER — LAMOTRIGINE 150 MG PO TABS
150.0000 mg | ORAL_TABLET | Freq: Every day | ORAL | 0 refills | Status: DC
Start: 2023-06-14 — End: 2023-07-11

## 2023-06-14 MED ORDER — SERTRALINE HCL 25 MG PO TABS: 75.0000 mg | ORAL_TABLET | Freq: Every day | ORAL | 0 refills | Status: DC

## 2023-06-21 NOTE — Progress Notes (Signed)
 Assessment:   1. Acute viral bronchitis   2. Cough, unspecified type   3. Mild intermittent asthma without complication (*)       Plan:   1. Labs and medications ordered today:  Patient's Medications  New Prescriptions   No medications on file     Outpatient Encounter Medications as of 06/21/2023  Medication Sig Dispense Refill  . albuterol  sulfate HFA (PROVENTIL ,VENTOLIN ,PROAIR ) 108 (90 Base) MCG/ACT inhaler Inhale two puffs into the lungs every 6 (six) hours as needed for Wheezing. 18 g 0  . ARIPiprazole  (ABILIFY ) 2 mg tablet Take one tablet (2 mg dose) by mouth daily.    . atomoxetine  (STRATTERA ) 40 mg capsule Take one capsule (40 mg dose) by mouth 2 (two) times a day with meals.    . atomoxetine  (STRATTERA ) 60 mg capsule Take one capsule (60 mg dose) by mouth 2 (two) times a day with meals.    . cyclobenzaprine  (FLEXERIL ) 10 mg tablet Take one tablet (10 mg dose) by mouth 2 (two) times a day as needed.    . dicyclomine (BENTYL) 20 mg tablet Take one tablet (20 mg dose) by mouth 2 (two) times daily for 30 days. 60 tablet 0  . gabapentin  (NEURONTIN ) 300 mg capsule Take two capsules (600 mg dose) by mouth at bedtime for 30 days. 60 capsule 11  . lamoTRIgine  (LAMICTAL ) 150 mg tablet Take one tablet (150 mg dose) by mouth daily.    . lamoTRIgine  (LAMICTAL ) 150 mg tablet Take one tablet (150 mg dose) by mouth daily.    . Misc. Devices MISC Use of cane to aid in ambulating as needed. 1 each 0  . montelukast  (SINGULAIR ) 10 MG tablet Take one tablet (10 mg dose) by mouth as needed. 90 tablet 0  . pregabalin  (LYRICA ) 75 mg capsule Take one capsule (75 mg dose) by mouth 3 (three) times a day.    . sertraline  (ZOLOFT ) 50 mg tablet Take one tablet (50 mg dose) by mouth daily.    Charlene SLYND 4 MG TABS Take one tablet (4 mg dose) by mouth daily.     Facility-Administered Encounter Medications as of 06/21/2023  Medication Dose Route Frequency Provider Last Rate Last Admin  . ipratropium-albuterol   (DUONEB) nebulizer solution 0.5-2.5 mg/3 mL  3 mL Inhalation ONCE        Continue singulair  for asthma management. Hot steamy shower in morning or at bedtime. Humidifier in bedroom. Increase fluids (ex. water, jello, soups, popsicles) Robitussin DM over the counter for cough. Acetaminophen  1000mg  with ibuprofen  400-600mg  every 8 hours as needed for fever or pain. Follow up with PCP in 5-7 days, sooner if worse or if new symptoms.  Go to the ED if worse.  Previsit planning was completed via snapshot and review of chart.  I reviewed the patient instructions on the AVS with the patient/family verbalized to me that they understood what their problem is, what they need to do about it, and why it is important that they do it.  The patient/family voices understanding of all medications. No barriers to adherence were noted. Patient is taking all medications as prescribed and is tolerating well.  Plan for follow-up as discussed or as needed if any worsening symptoms or change in condition.  After Visit Summary was given to patient/or sent to MyChart.      Newell Niels Grew, NP   Subjective:   Bronchitis: Charlene Griffin is a 33 y.o. female with a history of asthma who started coughing 15 hours  ago.  She took nyquil last night and awakened frequently and took a second dose of nyquil.  Throat is sore from coughing.  She slept with HOB elevated  She denies runny nose, congestion, fever, chills, N/V, body aches.Charlene Griffin  does not smoke cigarettes.   Allergies  Allergen Reactions  . Botulinum Toxin Type A Hives and Rash    Mood changes   . Onabotulinumtoxina Rash, Hives and Other    Mood changes   Per pt she gets very suicidal to the pint of being comitted  Mood changes , Mood changes , Per pt she gets very suicidal to the pint of being comitted, Mood changes , Mood changes , Per pt she gets very suicidal to the pint of being comitted  . Benadryl  Allergy Nausea And Vomiting  .  Diphenhydramine  Nausea And Vomiting  . Gluten Nausea And Vomiting    Blister & hives  . Hydrocodone-Acetaminophen  Nausea And Vomiting and Other  . Onabotulinumtoxina (Cosmetic) Rash and Unknown    Past Medical History:  Diagnosis Date  . Allergy   . Anxiety   . Hemorrhoids   . Migraines   . Nonpsychotic mental disorder      Objective:   Vitals:   06/21/23 0911  BP: 110/67  Pulse: 93  Resp: 20  Temp: 98.1 F (36.7 C)  SpO2: 97%     General appearance: alert, cooperative and no distress HEENT: Normocephalic, atraumatic, PERRLA, Conjunctiva normal, Bilateral EAC and TM normal, Nares normal, Oropharynx moist and clear Throat: lips, mucosa, and tongue normal; teeth and gums normal Neck: no adenopathy, no mass, midline trachea Lungs: Clear to auscultation. Heart: regular rate and rhythm with no murmur, rubs, or gallops Skin: Skin color, texture, turgor normal. No rashes or lesions  No results found.   Recent Results (from the past 14 hours)  POCT CoVid-19 nucleic acid   Collection Time: 06/21/23  9:12 AM  Result Value Ref Range   SARS-COV-2, NAA Negative Negative

## 2023-07-11 ENCOUNTER — Ambulatory Visit (HOSPITAL_BASED_OUTPATIENT_CLINIC_OR_DEPARTMENT_OTHER): Admitting: Psychiatry

## 2023-07-11 ENCOUNTER — Encounter (HOSPITAL_COMMUNITY): Payer: Self-pay | Admitting: Psychiatry

## 2023-07-11 ENCOUNTER — Other Ambulatory Visit: Payer: Self-pay

## 2023-07-11 VITALS — BP 111/69 | HR 84 | Ht 66.0 in | Wt 159.0 lb

## 2023-07-11 DIAGNOSIS — F840 Autistic disorder: Secondary | ICD-10-CM | POA: Diagnosis not present

## 2023-07-11 DIAGNOSIS — F4310 Post-traumatic stress disorder, unspecified: Secondary | ICD-10-CM

## 2023-07-11 DIAGNOSIS — F331 Major depressive disorder, recurrent, moderate: Secondary | ICD-10-CM | POA: Diagnosis not present

## 2023-07-11 DIAGNOSIS — F902 Attention-deficit hyperactivity disorder, combined type: Secondary | ICD-10-CM | POA: Diagnosis not present

## 2023-07-11 MED ORDER — LAMOTRIGINE 150 MG PO TABS: 150.0000 mg | ORAL_TABLET | Freq: Every day | ORAL | 0 refills | Status: DC

## 2023-07-11 MED ORDER — GUANFACINE HCL ER 2 MG PO TB24: 2.0000 mg | ORAL_TABLET | Freq: Every day | ORAL | 0 refills | Status: DC

## 2023-07-11 MED ORDER — HYDROXYZINE HCL 10 MG PO TABS: 10.0000 mg | ORAL_TABLET | Freq: Three times a day (TID) | ORAL | 0 refills | Status: DC | PRN

## 2023-07-11 MED ORDER — SERTRALINE HCL 25 MG PO TABS: 75.0000 mg | ORAL_TABLET | Freq: Every day | ORAL | 0 refills | Status: DC
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
## 2023-07-26 ENCOUNTER — Encounter: Payer: Self-pay | Admitting: Physical Medicine & Rehabilitation

## 2023-07-26 ENCOUNTER — Encounter: Attending: Physical Medicine & Rehabilitation | Admitting: Physical Medicine & Rehabilitation

## 2023-07-26 VITALS — BP 109/77 | HR 102 | Ht 66.0 in | Wt 163.0 lb

## 2023-07-26 DIAGNOSIS — M797 Fibromyalgia: Secondary | ICD-10-CM | POA: Diagnosis present

## 2023-07-26 DIAGNOSIS — G894 Chronic pain syndrome: Secondary | ICD-10-CM | POA: Diagnosis present

## 2023-07-26 DIAGNOSIS — M62838 Other muscle spasm: Secondary | ICD-10-CM | POA: Diagnosis present

## 2023-07-26 MED ORDER — SUZETRIGINE 50 MG PO TABS
50.0000 mg | ORAL_TABLET | Freq: Two times a day (BID) | ORAL | 0 refills | Status: AC | PRN
Start: 2023-07-26 — End: ?

## 2023-07-26 MED ORDER — METHOCARBAMOL 500 MG PO TABS: 500.0000 mg | ORAL_TABLET | Freq: Three times a day (TID) | ORAL | 0 refills | Status: AC | PRN

## 2023-07-26 NOTE — Progress Notes (Signed)
 Subjective:    Patient ID: Charlene Griffin, female    DOB: 02/12/1990, 33 y.o.   MRN: [id number]  HPI  HPI 12/14/22 Charlene Griffin is a 33 y.o. year old female  who  has a past medical history of ADHD, Allergy, Alopecia, Anxiety, Bronchitis, Celiac disease, Complication of anesthesia, GERD (gastroesophageal reflux disease), Hemorrhoids, Immune deficiency disorder (HCC), Insomnia, Major depression, Migraine with aura, Panic attack, PCOS (polycystic ovarian syndrome), PONV (postoperative nausea and vomiting), Tinnitus, and Umbilical hernia.   She has been diagnosed with fibromyalgia.  They are presenting to PM&R clinic as a new patient for pain management evaluation.  She reports she has had migraines for many years.  About 2 years ago she developed pain in her lower back and hips.  Pain is sometimes worse on the left and other times worse on the right.  Pain is worsened by any type of activity.  She is unable to work consistently due to her chronic pain.  Pain is worse when the weather changes.  She is also developed a tremor that is worse in her right lower extremity.  Tremor is worse when pain is most severe.  She reports she has had this since the beginning of the year.  She reports a addiction history in her family and would like to avoid opioid medications.  She reports poor sleep.  Chronic anxiety and depression.  No SI or HI.  Reports a lot of stress due to her pain and not being able to work.  Prior constipation but this improved after she stopped eating gluten. She is currently on birth control and not try to get pregnant.  She may consider getting pregnant in the future.  She reports she felt the best when she was in a warm dry climate.  She reports the pain is so severe it will cause her to vomit.  She reports muscle cramping in her legs.  Denies bowel or bladder incontinence.   Interval History by phone call 12/14/22  Pt seen by phone visit today, video visit scheduled but she had  computer issues. Pain and spasms much improved since TMS. Pain is mostly below 5/10. Once a month it will go to 8/10. She would like to wean down Cymbalta  and stop this medication.  She has been started on sertraline .  She is also interested in trying to wean down lyrica .    Interval history 04/05/2023 Patient reports that she has been doing overall well since completing TMS.  She feels like her pain continues to be improved since this time.  She is still working with physical therapy through [facility], getting dry needling as part of this.  She will intermittently get pain flares about 2 times a month where she has worsening shoulder and hip pain.  Other days pain is fairly well-controlled.  Pain often occurs in her shoulders and hips and she will have occasional reoccurrence of right leg twitching.  She has weaned off Lyrica  and Cymbalta .  Interval history 06/07/23 She has been having more frequent flareups of her pain since her last visit.  Pain is still improved since TMS but not as good it was initially.  She says she could repeat TMS but this requires daily visits for several weeks.  She continues to work with physical therapy on stretching, strengthening exercises and dry needling.  She will occasionally get a tremor in her right leg.  Her pain currently is in her hips, lower back, shoulders right greater than left.  She will have pain in her extremities, this can be in different extremities on different days.   Interval history 07/26/23 Patient reports that she has been having a lot of pain starting Monday of this week, had a bad flareup that started around this time.  Pain has been particularly bad in her lower back, hips and up to her shoulder and neck.  Patient reports pain was doing okay until this time.  She has been taking Lyrica , unsure how much it has been helping.  No side effects with the Lyrica .  Pain Inventory Average Pain 8 Pain Right Now 8 My pain is constant, sharp, stabbing, and  aching  In the last 24 hours, has pain interfered with the following? General activity 9 Relation with others 9 Enjoyment of life 9 What TIME of day is your pain at its worst? morning , daytime, evening, and night Sleep (in general) Fair  Pain is worse with: walking, bending, sitting, standing, and some activites Pain improves with: rest, heat/ice, and TENS Relief from Meds: 0  Family History  Problem Relation Age of Onset   Colon cancer Maternal Grandfather        passed away 19   Healthy Mother    Healthy Father    Asthma Sister    Factor V Leiden deficiency Sister    Ovarian cancer Paternal Grandmother    Social History   Socioeconomic History   Marital status: Married    Spouse name: Not on file   Number of children: Not on file   Years of education: Not on file   Highest education level: Not on file  Occupational History   Not on file  Tobacco Use   Smoking status: Never   Smokeless tobacco: Never  Vaping Use   Vaping status: Some Days   Substances: Nicotine, Flavoring  Substance and Sexual Activity   Alcohol use: Yes    Comment: socially   Drug use: Never   Sexual activity: Yes    Birth control/protection: Pill  Other Topics Concern   Not on file  Social History Narrative   Not on file   Social Drivers of Health   Financial Resource Strain: Low Risk  (01/26/2022)   Received from [facility]   Overall Financial Resource Strain (CARDIA)    Difficulty of Paying Living Expenses: Not very hard  Food Insecurity: No Food Insecurity (01/26/2022)   Received from [facility]   Hunger Vital Sign    Within the past 12 months, you worried that your food would run out before you got the money to buy more.: Never true    Within the past 12 months, the food you bought just didn't last and you didn't have money to get more.: Never true  Transportation Needs: No Transportation Needs (01/26/2022)   Received from [facility] - Transportation    Lack of  Transportation (Medical): No    Lack of Transportation (Non-Medical): No  Physical Activity: Insufficiently Active (11/13/2020)   Received from [facility]   Exercise Vital Sign    On average, how many days per week do you engage in moderate to strenuous exercise (like a brisk walk)?: 5 days    On average, how many minutes do you engage in exercise at this level?: 20 min  Stress: Stress Concern Present (11/13/2020)   Received from [facility] - Occupational Stress Questionnaire    Feeling of Stress : Very much  Social Connections:  Unknown (05/07/2021)   Received from [facility]   Social Network    Social Network: Not on file   Past Surgical History:  Procedure Laterality Date   HERNIA REPAIR     x 2   INCISIONAL HERNIA REPAIR N/A 09/02/2019   Procedure: LAPAROSCOPIC INCISIONAL HERNIA REPAIR WITH MESH;  Surgeon: Kinsinger, Herlene Righter, MD;  Location: [facility];  Service: General;  Laterality: N/A;   LAPAROSCOPY N/A 12/29/2018   Procedure: LAPAROSCOPY DIAGNOSTIC;  Surgeon: Jannis Kate Norris, MD;  Location: [facility];  Service: Gynecology;  Laterality: N/A;  1 hour surgery time/ follow Dr Cyrilla case   LEFT OOPHORECTOMY     OVARIAN CYST REMOVAL Left 12/29/2018   Procedure: LAPARASCOPIC OOPHARECTOMY;  Surgeon: Jannis Kate Norris, MD;  Location: [facility];  Service: Gynecology;  Laterality: Left;   WISDOM TOOTH EXTRACTION     Past Surgical History:  Procedure Laterality Date   HERNIA REPAIR     x 2   INCISIONAL HERNIA REPAIR N/A 09/02/2019   Procedure: LAPAROSCOPIC INCISIONAL HERNIA REPAIR WITH MESH;  Surgeon: Kinsinger, Herlene Righter, MD;  Location: [facility];  Service: General;  Laterality: N/A;   LAPAROSCOPY N/A 12/29/2018   Procedure: LAPAROSCOPY DIAGNOSTIC;  Surgeon: Jannis Kate Norris, MD;  Location: [facility];  Service: Gynecology;  Laterality: N/A;  1 hour surgery time/ follow Dr  Cyrilla case   LEFT OOPHORECTOMY     OVARIAN CYST REMOVAL Left 12/29/2018   Procedure: LAPARASCOPIC OOPHARECTOMY;  Surgeon: Jannis Kate Norris, MD;  Location: [facility];  Service: Gynecology;  Laterality: Left;   WISDOM TOOTH EXTRACTION     Past Medical History:  Diagnosis Date   ADHD    Allergy    Alopecia    Anxiety    Bronchitis    Celiac disease    Complication of anesthesia    intubation bronchitis   GERD (gastroesophageal reflux disease)    rare   Hemorrhoids    Immune deficiency disorder (HCC)    Insomnia    Major depression    Migraine with aura    Panic attack    with surgery   PCOS (polycystic ovarian syndrome)    PONV (postoperative nausea and vomiting)    Tinnitus    Umbilical hernia    Ht 5' 6 (1.676 m)   Wt 163 lb (73.9 kg)   BMI 26.31 kg/m   Opioid Risk Score:   Fall Risk Score:  `1  Depression screen [facility] 2/9     06/07/2023    3:17 PM 04/05/2023    3:24 PM 12/14/2022    2:54 PM 07/13/2022    3:43 PM 06/27/2022   10:05 AM 05/11/2022    3:20 PM 03/16/2022    2:51 PM  Depression screen PHQ 2/9  Decreased Interest 0 1 2 0  0 0  Down, Depressed, Hopeless 0 1 2 0  0 0  PHQ - 2 Score 0 2 4 0  0 0  Altered sleeping  3       Tired, decreased energy  3       Change in appetite  2       Feeling bad or failure about yourself   1       Trouble concentrating  3       Moving slowly or fidgety/restless  0       Suicidal thoughts  0       PHQ-9 Score  14  Difficult doing work/chores            Information is confidential and restricted. Go to Review Flowsheets to unlock data.     Review of Systems  Musculoskeletal:  Positive for back pain and neck pain.       Right shoulder pain,  pain in both hips  All other systems reviewed and are negative.      Objective:   Physical Exam  P Gen: no distress, normal appearing HEENT: oral mucosa pink and moist, NCAT Chest: normal effort, normal rate of breathing Abd: soft,  non-distended Ext: no edema Psych: pleasant, normal affect Skin: intact Neuro: Awake and alert, follows commands, cranial nerves II through XII grossly intact sensation intact in all 4 extremities Moving all extremities to gravity and resistance Musculoskeletal: Tender primarily in her bilateral hips, lower back, parascapular muscles, cervical spine paraspinal muscles Hypermobility noted in her fingers able to get thumb close to her wrist, elbows, hips, knees.    Head CT 06/12/21 No acute intracranial abnormality.    MRI spine 04/03/21 IMPRESSION: 1. Normal MRI appearance of the spinal cord. No cord signal changes to suggest myelopathy. No abnormal enhancement. 2. Mild degenerative disc disease at C3-4 through C6-7, with additional mild noncompressive disc bulging at L2-3. No significant stenosis or neural impingement. 3. Mild facet hypertrophy at L3-4 and L4-5.       Assessment & Plan:   Fibromyalgia, improved after TMS -She is currently working with PT, could try pool therapy at a later time -Patient discontinued Lyrica  and Cymbalta  because her pain was doing better after TMS - Continue Lyrica  75 mg 3 times daily -Continue zynex device -Continue current regimen -Consider low dose naltrexone at a later visit- limited by cost  -She reports improvement in her pain after TMS treatment previously but pain is now worsening again -Advised theracane -Patient reports poor results with NSAIDs, Tylenol , muscle relaxers  -Tramadol  discontinued due to poor benefit -ORT moderate -Start Journavx, discussed possible decrease contraceptive effectiveness when using this medication -Start robaxin  500 mg PRN, patient would like to try this to see if it is less sedating than Flexeril .  She has a hard time working when taking Flexeril  -Pt currently on contraceptives not trying to get pregnant, discussed lyrica , journavx and robaxin  could have risk to fetus if continued during a pregnacy   Lumbar  and cervical spondylosis, history of SI joint dysfunction -Continue regimen as above -Zynex Cryoheat device- continue   Tremor and muscle spasms in right lower extremity -Neurology workup completed,  MRI C spine, T spine, EEG, EMG/NCS RLE without cause found, -She reports this has improved after TMS but has been returning to some degree   Migraine headache -Continue follow-up with neurology   Depression and anxiety -Denies HI or SI -She is currently following with psychiatry -Had TMS completed with benefit  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

## 2023-08-09 ENCOUNTER — Other Ambulatory Visit (HOSPITAL_COMMUNITY): Payer: Self-pay

## 2023-08-09 DIAGNOSIS — F331 Major depressive disorder, recurrent, moderate: Secondary | ICD-10-CM

## 2023-08-09 DIAGNOSIS — F4310 Post-traumatic stress disorder, unspecified: Secondary | ICD-10-CM

## 2023-08-09 DIAGNOSIS — F902 Attention-deficit hyperactivity disorder, combined type: Secondary | ICD-10-CM

## 2023-08-09 MED ORDER — SERTRALINE HCL 25 MG PO TABS: 75.0000 mg | ORAL_TABLET | Freq: Every day | ORAL | 0 refills | Status: DC

## 2023-08-09 MED ORDER — LAMOTRIGINE 150 MG PO TABS: 150.0000 mg | ORAL_TABLET | Freq: Every day | ORAL | 0 refills | Status: DC

## 2023-08-09 MED ORDER — GUANFACINE HCL ER 2 MG PO TB24: 2.0000 mg | ORAL_TABLET | Freq: Every day | ORAL | 0 refills | Status: DC

## 2023-08-15 ENCOUNTER — Encounter (HOSPITAL_COMMUNITY): Payer: Self-pay | Admitting: Psychiatry

## 2023-08-15 ENCOUNTER — Other Ambulatory Visit: Payer: Self-pay

## 2023-08-15 ENCOUNTER — Ambulatory Visit (HOSPITAL_BASED_OUTPATIENT_CLINIC_OR_DEPARTMENT_OTHER): Admitting: Psychiatry

## 2023-08-15 ENCOUNTER — Encounter (HOSPITAL_COMMUNITY): Payer: Self-pay

## 2023-08-15 VITALS — BP 130/58 | HR 71 | Ht 66.0 in | Wt 170.0 lb

## 2023-08-15 DIAGNOSIS — F331 Major depressive disorder, recurrent, moderate: Secondary | ICD-10-CM | POA: Diagnosis not present

## 2023-08-15 DIAGNOSIS — F840 Autistic disorder: Secondary | ICD-10-CM

## 2023-08-15 DIAGNOSIS — F902 Attention-deficit hyperactivity disorder, combined type: Secondary | ICD-10-CM

## 2023-08-15 DIAGNOSIS — F4310 Post-traumatic stress disorder, unspecified: Secondary | ICD-10-CM | POA: Diagnosis not present

## 2023-08-15 MED ORDER — LAMOTRIGINE 150 MG PO TABS: 150.0000 mg | ORAL_TABLET | Freq: Every day | ORAL | 1 refills | Status: DC

## 2023-08-15 MED ORDER — LISDEXAMFETAMINE DIMESYLATE 20 MG PO CAPS
20.0000 mg | ORAL_CAPSULE | Freq: Every day | ORAL | 0 refills | Status: DC
Start: 2023-08-15 — End: 2023-08-23

## 2023-08-15 MED ORDER — SERTRALINE HCL 25 MG PO TABS: 75.0000 mg | ORAL_TABLET | Freq: Every day | ORAL | 1 refills | Status: DC
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
## 2023-08-16 ENCOUNTER — Ambulatory Visit: Admitting: Physical Medicine & Rehabilitation

## 2023-08-22 ENCOUNTER — Telehealth (HOSPITAL_COMMUNITY): Payer: Self-pay | Admitting: *Deleted

## 2023-08-22 NOTE — Telephone Encounter (Signed)
 Pt called requesting increase in the Vyvanse  dose. This medication initiated on 08/15/23. Pt currently on 20 mg every day but feels that is not working. Please review.  Last visit: 08/15/23 Next visit: 09/26/23

## 2023-08-22 NOTE — Telephone Encounter (Signed)
 She just started 10 days ago. Need to give more time.

## 2023-08-22 NOTE — Telephone Encounter (Signed)
 Will advise

## 2023-08-23 ENCOUNTER — Telehealth (HOSPITAL_COMMUNITY): Payer: Self-pay | Admitting: *Deleted

## 2023-08-23 ENCOUNTER — Other Ambulatory Visit (HOSPITAL_COMMUNITY): Payer: Self-pay | Admitting: Psychiatry

## 2023-08-23 DIAGNOSIS — F331 Major depressive disorder, recurrent, moderate: Secondary | ICD-10-CM

## 2023-08-23 DIAGNOSIS — F902 Attention-deficit hyperactivity disorder, combined type: Secondary | ICD-10-CM

## 2023-08-23 MED ORDER — LISDEXAMFETAMINE DIMESYLATE 30 MG PO CAPS: 30.0000 mg | ORAL_CAPSULE | Freq: Every day | ORAL | 0 refills | Status: DC

## 2023-08-23 NOTE — Telephone Encounter (Signed)
 Pt called this morning to f/u on the Vyvanse  issue and wanted to add that she is having extreme irritation every day now about 1700. Pt states that her husband has even commented on this and she is afraid it is interfering with her job. Pt says she takes the Vyvanse  20 mg usually @ 0700. Please review and advise.

## 2023-08-23 NOTE — Progress Notes (Signed)
 A new prescription of higher dose of Vyvanse  sent ot pharmacy. Patient complained that it is not working for all day and she experience symptoms at late afternoon.

## 2023-08-23 NOTE — Telephone Encounter (Signed)
 She need to stop Vyvanse .  That is the only medication we added and may not be working for her.  Once symptoms get better we will consider a different medication.

## 2023-08-30 ENCOUNTER — Ambulatory Visit: Admitting: Physical Medicine & Rehabilitation

## 2023-09-11 ENCOUNTER — Encounter (HOSPITAL_COMMUNITY): Payer: Self-pay

## 2023-09-25 NOTE — Progress Notes (Signed)
 Assessment:   1. Nausea and vomiting, unspecified vomiting type   2. Bronchitis, allergic, unspecified asthma severity, with acute exacerbation (*)       Plan:   1. Labs and medications ordered today:  Patient's Medications  New Prescriptions   OMEPRAZOLE  (PRILOSEC) 40 MG CAPSULE    Take one capsule (40 mg dose) by mouth daily for 30 days. Take 30 minutes before your first meal of the day   ONDANSETRON  (ZOFRAN -ODT) 4 MG DISINTEGRATING TABLET    Take one tablet (4 mg dose) by mouth every 8 (eight) hours as needed for Nausea.     Encounter Medications[1] Hot steamy shower in morning or at bedtime. Humidifier in bedroom. Increase fluids (ex. water, jello, soups, popsicles) Continue Singulair . Albuterol  nebulizer as needed. Resume antihistamine. Start Prilosec daily. Ondansetron  as needed for nausea. Acetaminophen  1000mg  with ibuprofen  400-600mg  every 8 hours as needed for fever or pain. Follow up with PCP in 5-7 days, sooner if worse or if new symptoms.  Go to the ED if worse.  Previsit planning was completed via snapshot and review of chart.  I reviewed the patient instructions on the AVS with the patient/family verbalized to me that they understood what their problem is, what they need to do about it, and why it is important that they do it.  The patient/family voices understanding of all medications. No barriers to adherence were noted. Patient is taking all medications as prescribed and is tolerating well.  Plan for follow-up as discussed or as needed if any worsening symptoms or change in condition.  After Visit Summary was given to patient/or sent to MyChart.      Newell Niels Grew, NP   Subjective:    Charlene Griffin is a 33 y.o. female who complains of cough, minimal sinus congestion, nausea and vomiting. Symptoms began 1 week ago. Sibyl continues Singulair  for management of allergy and asthma.  She complains of increased postnasal drainage that is led to nausea  and vomiting.  She has taken ondansetron  in the past for the symptoms that helped.  She denies fever, chills, abdominal pain, diarrhea, dizziness, lightheadedness.  She has an albuterol  nebulizer but states she has not had wheezing.  Allergies[2]  Past Medical History:  Diagnosis Date  . Allergy   . Anxiety   . Hemorrhoids   . Migraines   . Nonpsychotic mental disorder      Objective:   Vitals:   09/25/23 0851  BP: 108/71  Pulse: 70  Resp: 18  Temp: 98.2 F (36.8 C)  SpO2: 98%     General appearance: alert, cooperative and no distress HEENT: Normocephalic, atraumatic, PERRLA, Conjunctiva normal, Bilateral EAC and TM normal, Nares normal, Oropharynx moist and clear Throat: lips, mucosa, and tongue normal; teeth and gums normal Neck: no adenopathy, no mass, midline trachea Lungs: Sounds are equal and coarse bilaterally.  Heart: regular rate and rhythm with no murmur, rubs, or gallops Abdomen: Soft, nontender , adequate bowel sounds.  No rebound, no guarding.  Skin color, texture, turgor normal. No rashes or lesions  No results found.   No results found for this or any previous visit (from the past 14 hours).       [1] Outpatient Encounter Medications as of 09/25/2023  Medication Sig Dispense Refill  . albuterol  sulfate HFA (PROVENTIL ,VENTOLIN ,PROAIR ) 108 (90 Base) MCG/ACT inhaler Inhale two puffs into the lungs every 6 (six) hours as needed for Wheezing. 18 g 0  . ARIPiprazole  (ABILIFY ) 2 mg tablet Take one tablet (  2 mg dose) by mouth daily.    . atomoxetine  (STRATTERA ) 40 mg capsule Take one capsule (40 mg dose) by mouth 2 (two) times a day with meals.    . atomoxetine  (STRATTERA ) 60 mg capsule Take one capsule (60 mg dose) by mouth 2 (two) times a day with meals.    . cyclobenzaprine  (FLEXERIL ) 10 mg tablet Take one tablet (10 mg dose) by mouth 2 (two) times a day as needed.    . dicyclomine (BENTYL) 20 mg tablet Take one tablet (20 mg dose) by mouth 2 (two) times daily  for 30 days. 60 tablet 0  . gabapentin  (NEURONTIN ) 300 mg capsule Take two capsules (600 mg dose) by mouth at bedtime for 30 days. 60 capsule 11  . lamoTRIgine  (LAMICTAL ) 150 mg tablet Take one tablet (150 mg dose) by mouth daily.    . lamoTRIgine  (LAMICTAL ) 150 mg tablet Take one tablet (150 mg dose) by mouth daily.    . Misc. Devices MISC Use of cane to aid in ambulating as needed. 1 each 0  . montelukast  (SINGULAIR ) 10 MG tablet Take one tablet (10 mg dose) by mouth as needed. 90 tablet 0  . omeprazole  (PRILOSEC) 40 mg capsule Take one capsule (40 mg dose) by mouth daily for 30 days. Take 30 minutes before your first meal of the day 30 capsule 0  . ondansetron  (ZOFRAN -ODT) 4 mg disintegrating tablet Take one tablet (4 mg dose) by mouth every 8 (eight) hours as needed for Nausea. 10 tablet 0  . pregabalin  (LYRICA ) 75 mg capsule Take one capsule (75 mg dose) by mouth 3 (three) times a day. (Patient not taking: Reported on 09/25/2023)    . sertraline  (ZOLOFT ) 50 mg tablet Take one tablet (50 mg dose) by mouth daily.    SABRA SLYND 4 MG TABS Take one tablet (4 mg dose) by mouth daily.     No facility-administered encounter medications on file as of 09/25/2023.  [2] Allergies Allergen Reactions  . Botulinum Toxin Type A Hives and Rash    Mood changes   . Onabotulinumtoxina Rash, Hives and Other    Mood changes   Per pt she gets very suicidal to the pint of being comitted  Mood changes , Mood changes , Per pt she gets very suicidal to the pint of being comitted, Mood changes , Mood changes , Per pt she gets very suicidal to the pint of being comitted  . Benadryl  Allergy Nausea And Vomiting  . Diphenhydramine  Nausea And Vomiting  . Gluten Nausea And Vomiting    Blister & hives  . Hydrocodone-Acetaminophen  Nausea And Vomiting and Other  . Onabotulinumtoxina (Cosmetic) Rash and Unknown

## 2023-09-26 ENCOUNTER — Encounter (HOSPITAL_COMMUNITY): Payer: Self-pay | Admitting: Psychiatry

## 2023-09-26 ENCOUNTER — Ambulatory Visit (HOSPITAL_BASED_OUTPATIENT_CLINIC_OR_DEPARTMENT_OTHER): Admitting: Psychiatry

## 2023-09-26 VITALS — BP 132/77 | HR 69 | Ht 66.0 in | Wt 170.0 lb

## 2023-09-26 DIAGNOSIS — F4310 Post-traumatic stress disorder, unspecified: Secondary | ICD-10-CM | POA: Diagnosis not present

## 2023-09-26 DIAGNOSIS — F331 Major depressive disorder, recurrent, moderate: Secondary | ICD-10-CM

## 2023-09-26 DIAGNOSIS — F902 Attention-deficit hyperactivity disorder, combined type: Secondary | ICD-10-CM | POA: Diagnosis not present

## 2023-09-26 MED ORDER — HYDROXYZINE HCL 10 MG PO TABS: 10.0000 mg | ORAL_TABLET | Freq: Three times a day (TID) | ORAL | 0 refills | Status: DC | PRN

## 2023-09-26 MED ORDER — ATOMOXETINE HCL 40 MG PO CAPS: 40.0000 mg | ORAL_CAPSULE | Freq: Two times a day (BID) | ORAL | 0 refills | Status: DC

## 2023-09-26 MED ORDER — LAMOTRIGINE 150 MG PO TABS: 150.0000 mg | ORAL_TABLET | Freq: Every day | ORAL | 0 refills | Status: DC

## 2023-09-26 MED ORDER — SERTRALINE HCL 25 MG PO TABS: 75.0000 mg | ORAL_TABLET | Freq: Every day | ORAL | 0 refills | Status: DC
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
## 2023-09-27 ENCOUNTER — Encounter: Attending: Physical Medicine & Rehabilitation | Admitting: Physical Medicine & Rehabilitation

## 2023-09-27 ENCOUNTER — Encounter: Payer: Self-pay | Admitting: Physical Medicine & Rehabilitation

## 2023-09-27 VITALS — BP 114/75 | HR 74 | Ht 66.0 in | Wt 169.0 lb

## 2023-09-27 DIAGNOSIS — G894 Chronic pain syndrome: Secondary | ICD-10-CM | POA: Diagnosis not present

## 2023-09-27 DIAGNOSIS — M2550 Pain in unspecified joint: Secondary | ICD-10-CM | POA: Diagnosis not present

## 2023-09-27 DIAGNOSIS — M797 Fibromyalgia: Secondary | ICD-10-CM | POA: Diagnosis not present

## 2023-09-27 NOTE — Progress Notes (Signed)
 Subjective:    Patient ID: Charlene Griffin, female    DOB: 10/10/1990, 33 y.o.   MRN: [id number]  HPI  HPI 12/14/22 Charlene Griffin is a 33 y.o. year old female  who  has a past medical history of ADHD, Allergy, Alopecia, Anxiety, Bronchitis, Celiac disease, Complication of anesthesia, GERD (gastroesophageal reflux disease), Hemorrhoids, Immune deficiency disorder (HCC), Insomnia, Major depression, Migraine with aura, Panic attack, PCOS (polycystic ovarian syndrome), PONV (postoperative nausea and vomiting), Tinnitus, and Umbilical hernia.   She has been diagnosed with fibromyalgia.  They are presenting to PM&R clinic as a new patient for pain management evaluation.  She reports she has had migraines for many years.  About 2 years ago she developed pain in her lower back and hips.  Pain is sometimes worse on the left and other times worse on the right.  Pain is worsened by any type of activity.  She is unable to work consistently due to her chronic pain.  Pain is worse when the weather changes.  She is also developed a tremor that is worse in her right lower extremity.  Tremor is worse when pain is most severe.  She reports she has had this since the beginning of the year.  She reports a addiction history in her family and would like to avoid opioid medications.  She reports poor sleep.  Chronic anxiety and depression.  No SI or HI.  Reports a lot of stress due to her pain and not being able to work.  Prior constipation but this improved after she stopped eating gluten. She is currently on birth control and not try to get pregnant.  She may consider getting pregnant in the future.  She reports she felt the best when she was in a warm dry climate.  She reports the pain is so severe it will cause her to vomit.  She reports muscle cramping in her legs.  Denies bowel or bladder incontinence.   Interval History by phone call 12/14/22  Pt seen by phone visit today, video visit scheduled but she had  computer issues. Pain and spasms much improved since TMS. Pain is mostly below 5/10. Once a month it will go to 8/10. She would like to wean down Cymbalta  and stop this medication.  She has been started on sertraline .  She is also interested in trying to wean down lyrica .    Interval history 04/05/2023 Patient reports that she has been doing overall well since completing TMS.  She feels like her pain continues to be improved since this time.  She is still working with physical therapy through [facility], getting dry needling as part of this.  She will intermittently get pain flares about 2 times a month where she has worsening shoulder and hip pain.  Other days pain is fairly well-controlled.  Pain often occurs in her shoulders and hips and she will have occasional reoccurrence of right leg twitching.  She has weaned off Lyrica  and Cymbalta .  Interval history 06/07/23 She has been having more frequent flareups of her pain since her last visit.  Pain is still improved since TMS but not as good it was initially.  She says she could repeat TMS but this requires daily visits for several weeks.  She continues to work with physical therapy on stretching, strengthening exercises and dry needling.  She will occasionally get a tremor in her right leg.  Her pain currently is in her hips, lower back, shoulders right greater than left.  She will have pain in her extremities, this can be in different extremities on different days.   Interval history 07/26/23 Patient reports that she has been having a lot of pain starting Monday of this week, had a bad flareup that started around this time.  Pain has been particularly bad in her lower back, hips and up to her shoulder and neck.  Patient reports pain was doing okay until this time.  She has been taking Lyrica , unsure how much it has been helping.  No side effects with the Lyrica .  Interval History 09/27/23 Reports fibromyalgia symptoms are generally well-controlled. Has  not experienced any major flare-ups. Continues to have ups and downs with pain, often related to weather changes (hot to cold). Overall pain level is significantly improved. Current average pain is 4-5/10, sometimes as low as 0-1/10. Tremors that were previously severe and required a cane are now infrequent and mild, mainly occurring at night. Shoulder pain is present, localized to the trapezius.   Reports a recent non-contagious bronchitis secondary to allergies and drainage. Voice is hoarse.  Mood is up and down, affected by weather. Depression is doing okay.  Transcranial magnetic stimulation (TMS) in the past was helpful for fibromyalgia symptoms but not for anxiety and depression. Acknowledges the significant time commitment of TMS.  Medications - Journavx : Reports this medication is a Insurance claims handler for severe flare-ups, making them manageable. Has only used a few pills since starting. Experiences no side effects, despite high sensitivity to other medications. - Lyrica : Discontinued. Restarted briefly after TMS but stopped  - Cymbalta : Discontinued. - Robaxin : occasional use  Pain Inventory Average Pain 8 Pain Right Now 8 My pain is constant, sharp, stabbing, and aching  In the last 24 hours, has pain interfered with the following? General activity 9 Relation with others 9 Enjoyment of life 9 What TIME of day is your pain at its worst? morning , daytime, evening, and night Sleep (in general) Fair  Pain is worse with: walking, bending, sitting, standing, and some activites Pain improves with: rest, heat/ice, and TENS Relief from Meds: 0  Family History  Problem Relation Age of Onset   Colon cancer Maternal Grandfather        passed away 93   Healthy Mother    Healthy Father    Asthma Sister    Factor V Leiden deficiency Sister    Ovarian cancer Paternal Grandmother    Social History   Socioeconomic History   Marital status: Married    Spouse name: Not on file    Number of children: Not on file   Years of education: Not on file   Highest education level: Not on file  Occupational History   Not on file  Tobacco Use   Smoking status: Never   Smokeless tobacco: Never  Vaping Use   Vaping status: Some Days   Substances: Nicotine, Flavoring  Substance and Sexual Activity   Alcohol use: Yes    Comment: socially   Drug use: Never   Sexual activity: Yes    Birth control/protection: Pill  Other Topics Concern   Not on file  Social History Narrative   Not on file   Social Drivers of Health   Financial Resource Strain: Low Risk  (01/26/2022)   Received from [facility]   Overall Financial Resource Strain (CARDIA)    Difficulty of Paying Living Expenses: Not very hard  Food Insecurity: No Food Insecurity (01/26/2022)   Received from [facility]   Hunger Vital  Sign    Within the past 12 months, you worried that your food would run out before you got the money to buy more.: Never true    Within the past 12 months, the food you bought just didn't last and you didn't have money to get more.: Never true  Transportation Needs: No Transportation Needs (01/26/2022)   Received from [facility]   PRAPARE - Transportation    Lack of Transportation (Medical): No    Lack of Transportation (Non-Medical): No  Physical Activity: Insufficiently Active (11/13/2020)   Received from [facility]   Exercise Vital Sign    On average, how many days per week do you engage in moderate to strenuous exercise (like a brisk walk)?: 5 days    On average, how many minutes do you engage in exercise at this level?: 20 min  Stress: Stress Concern Present (11/13/2020)   Received from [facility] - Occupational Stress Questionnaire    Feeling of Stress : Very much  Social Connections: Unknown (05/07/2021)   Received from [facility]   Social Network    Social Network: Not on file   Past Surgical History:  Procedure  Laterality Date   HERNIA REPAIR     x 2   INCISIONAL HERNIA REPAIR N/A 09/02/2019   Procedure: LAPAROSCOPIC INCISIONAL HERNIA REPAIR WITH MESH;  Surgeon: Kinsinger, Herlene Righter, MD;  Location: [facility];  Service: General;  Laterality: N/A;   LAPAROSCOPY N/A 12/29/2018   Procedure: LAPAROSCOPY DIAGNOSTIC;  Surgeon: Jannis Kate Norris, MD;  Location[facility];  Service: Gynecology;  Laterality: N/A;  1 hour surgery time/ follow Dr Cyrilla case   LEFT OOPHORECTOMY     OVARIAN CYST REMOVAL Left 12/29/2018   Procedure: LAPARASCOPIC OOPHARECTOMY;  Surgeon: Jannis Kate Norris, MD;  Location: [facility];  Service: Gynecology;  Laterality: Left;   WISDOM TOOTH EXTRACTION     Past Surgical History:  Procedure Laterality Date   HERNIA REPAIR     x 2   INCISIONAL HERNIA REPAIR N/A 09/02/2019   Procedure: LAPAROSCOPIC INCISIONAL HERNIA REPAIR WITH MESH;  Surgeon: Kinsinger, Herlene Righter, MD;  Location: [facility];  Service: General;  Laterality: N/A;   LAPAROSCOPY N/A 12/29/2018   Procedure: LAPAROSCOPY DIAGNOSTIC;  Surgeon: Jannis Kate Norris, MD;  Location: [facility];  Service: Gynecology;  Laterality: N/A;  1 hour surgery time/ follow Dr Cyrilla case   LEFT OOPHORECTOMY     OVARIAN CYST REMOVAL Left 12/29/2018   Procedure: LAPARASCOPIC OOPHARECTOMY;  Surgeon: Jannis Kate Norris, MD;  Location: [facility];  Service: Gynecology;  Laterality: Left;   WISDOM TOOTH EXTRACTION     Past Medical History:  Diagnosis Date   ADHD    Allergy    Alopecia    Anxiety    Bronchitis    Celiac disease    Complication of anesthesia    intubation bronchitis   GERD (gastroesophageal reflux disease)    rare   Hemorrhoids    Immune deficiency disorder (HCC)    Insomnia    Major depression    Migraine with aura    Panic attack    with surgery   PCOS (polycystic ovarian syndrome)    PONV (postoperative nausea and vomiting)    Tinnitus     Umbilical hernia    There were no vitals taken for this visit.  Opioid Risk Score:   Fall Risk Score:  `1  Depression screen  PHQ 2/9     07/26/2023    1:17 PM 06/07/2023    3:17 PM 04/05/2023    3:24 PM 12/14/2022    2:54 PM 07/13/2022    3:43 PM 06/27/2022   10:05 AM 05/11/2022    3:20 PM  Depression screen PHQ 2/9  Decreased Interest 1 0 1 2 0  0  Down, Depressed, Hopeless 1 0 1 2 0  0  PHQ - 2 Score 2 0 2 4 0  0  Altered sleeping   3      Tired, decreased energy   3      Change in appetite   2      Feeling bad or failure about yourself    1      Trouble concentrating   3      Moving slowly or fidgety/restless   0      Suicidal thoughts   0      PHQ-9 Score   14      Difficult doing work/chores            Information is confidential and restricted. Go to Review Flowsheets to unlock data.     Review of Systems  Musculoskeletal:  Positive for back pain and neck pain.       Right shoulder pain,  pain in both hips  All other systems reviewed and are negative.      Objective:   Physical Exam  P Gen: no distress, normal appearing HEENT: oral mucosa pink and moist, NCAT Chest: normal effort, normal rate of breathing Abd: soft, non-distended Ext: no edema Psych: pleasant, normal affect Skin: intact Neuro: Awake and alert, follows commands, cranial nerves II through XII grossly intact sensation intact in all 4 extremities Moving all extremities to gravity and resistance Musculoskeletal:  TTP in b/l trapezius Minimal TTP in UE and LEs    Head CT 06/12/21 No acute intracranial abnormality.    MRI spine 04/03/21 IMPRESSION: 1. Normal MRI appearance of the spinal cord. No cord signal changes to suggest myelopathy. No abnormal enhancement. 2. Mild degenerative disc disease at C3-4 through C6-7, with additional mild noncompressive disc bulging at L2-3. No significant stenosis or neural impingement. 3. Mild facet hypertrophy at L3-4 and L4-5.       Assessment & Plan:    Fibromyalgia, improved after TMS -She is currently working with PT, could try pool therapy at a later time -Patient discontinued Lyrica  and Cymbalta  because her pain was doing better after TMS -Pt has discontinued Lyrica  75 mg 3 times daily- continue to hold medication -Continue zynex device -Continue current regimen -Consider low dose naltrexone at a later visit- limited by cost  -She reports improvement in her pain after TMS treatment overall -Patient reports poor results with NSAIDs, Tylenol , muscle relaxers  -Tramadol  discontinued due to poor benefit -ORT moderate -Continue Journavx , discussed possible decrease contraceptive effectiveness when using this medication prior visit. Reports this works great -Continue robaxin  500 mg PRN -Prior use of Flexeril .  She has a hard time working when taking Flexeril  -Pt currently on contraceptives not trying to get pregnant, discussed journavx  and robaxin  could have risk to fetus if continued during a pregnacy   Lumbar and cervical spondylosis, history of SI joint dysfunction -Continue regimen as above -Zynex Cryoheat device- continue   Tremor and muscle spasms in right lower extremity -Neurology workup completed,  MRI C spine, T spine, EEG, EMG/NCS RLE without cause found, -She reports this has improved after TMS  Migraine headache -Continue follow-up with neurology   Depression and anxiety -Denies HI or SI -Had TMS completed with benefit more to pain than mood  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

## 2023-10-24 ENCOUNTER — Ambulatory Visit (HOSPITAL_COMMUNITY): Admitting: Psychiatry

## 2023-10-24 ENCOUNTER — Other Ambulatory Visit (HOSPITAL_COMMUNITY): Payer: Self-pay | Admitting: *Deleted

## 2023-10-24 DIAGNOSIS — F331 Major depressive disorder, recurrent, moderate: Secondary | ICD-10-CM

## 2023-10-24 DIAGNOSIS — F4310 Post-traumatic stress disorder, unspecified: Secondary | ICD-10-CM

## 2023-10-24 DIAGNOSIS — F902 Attention-deficit hyperactivity disorder, combined type: Secondary | ICD-10-CM

## 2023-10-24 MED ORDER — ATOMOXETINE HCL 40 MG PO CAPS: 40.0000 mg | ORAL_CAPSULE | Freq: Two times a day (BID) | ORAL | 0 refills | Status: DC

## 2023-10-24 MED ORDER — SERTRALINE HCL 25 MG PO TABS: 75.0000 mg | ORAL_TABLET | Freq: Every day | ORAL | 0 refills | Status: DC

## 2023-10-24 MED ORDER — HYDROXYZINE HCL 10 MG PO TABS: 10.0000 mg | ORAL_TABLET | Freq: Three times a day (TID) | ORAL | 0 refills | Status: DC | PRN

## 2023-10-24 MED ORDER — LAMOTRIGINE 150 MG PO TABS: 150.0000 mg | ORAL_TABLET | Freq: Every day | ORAL | 0 refills | Status: DC

## 2023-11-07 ENCOUNTER — Encounter (HOSPITAL_COMMUNITY): Payer: Self-pay | Admitting: Psychiatry

## 2023-11-07 ENCOUNTER — Telehealth (HOSPITAL_BASED_OUTPATIENT_CLINIC_OR_DEPARTMENT_OTHER): Admitting: Psychiatry

## 2023-11-07 VITALS — Wt 169.0 lb

## 2023-11-07 DIAGNOSIS — F331 Major depressive disorder, recurrent, moderate: Secondary | ICD-10-CM | POA: Diagnosis not present

## 2023-11-07 DIAGNOSIS — F4310 Post-traumatic stress disorder, unspecified: Secondary | ICD-10-CM | POA: Diagnosis not present

## 2023-11-07 DIAGNOSIS — F902 Attention-deficit hyperactivity disorder, combined type: Secondary | ICD-10-CM | POA: Diagnosis not present

## 2023-11-07 MED ORDER — SERTRALINE HCL 25 MG PO TABS: 75.0000 mg | ORAL_TABLET | Freq: Every day | ORAL | 1 refills | Status: DC

## 2023-11-07 MED ORDER — LAMOTRIGINE 150 MG PO TABS: 150.0000 mg | ORAL_TABLET | Freq: Every day | ORAL | 1 refills | Status: DC

## 2023-11-07 MED ORDER — ATOMOXETINE HCL 60 MG PO CAPS: 60.0000 mg | ORAL_CAPSULE | Freq: Two times a day (BID) | ORAL | 1 refills | Status: DC

## 2023-11-07 MED ORDER — HYDROXYZINE PAMOATE 25 MG PO CAPS
25.0000 mg | ORAL_CAPSULE | Freq: Every evening | ORAL | 1 refills | Status: AC | PRN
Start: 2023-11-07 — End: ?
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
## 2023-11-27 ENCOUNTER — Encounter (HOSPITAL_BASED_OUTPATIENT_CLINIC_OR_DEPARTMENT_OTHER): Payer: Self-pay | Admitting: Radiology

## 2023-11-27 ENCOUNTER — Emergency Department (HOSPITAL_BASED_OUTPATIENT_CLINIC_OR_DEPARTMENT_OTHER)
Admission: EM | Admit: 2023-11-27 | Discharge: 2023-11-27 | Disposition: A | Discharge status: Home / Self Care | Attending: Emergency Medicine | Admitting: Emergency Medicine

## 2023-11-27 ENCOUNTER — Emergency Department (HOSPITAL_BASED_OUTPATIENT_CLINIC_OR_DEPARTMENT_OTHER)

## 2023-11-27 ENCOUNTER — Other Ambulatory Visit: Payer: Self-pay

## 2023-11-27 DIAGNOSIS — R1085 Abdominal pain of multiple sites: Secondary | ICD-10-CM | POA: Diagnosis present

## 2023-11-27 LAB — COMPREHENSIVE METABOLIC PANEL WITH GFR
ALT: 25 U/L (ref 0–44)
AST: 25 U/L (ref 15–41)
Albumin: 4.7 g/dL (ref 3.5–5.0)
Alkaline Phosphatase: 72 U/L (ref 38–126)
Anion gap: 13 (ref 5–15)
BUN: 9 mg/dL (ref 6–20)
CO2: 24 mmol/L (ref 22–32)
Calcium: 10 mg/dL (ref 8.9–10.3)
Chloride: 101 mmol/L (ref 98–111)
Creatinine, Ser: 0.75 mg/dL (ref 0.44–1.00)
GFR, Estimated: >60 mL/min (ref >60)
Glucose, Bld: 87 mg/dL (ref 70–99)
Potassium: 4.2 mmol/L (ref 3.5–5.1)
Sodium: 138 mmol/L (ref 135–145)
Total Bilirubin: 0.7 mg/dL (ref 0.0–1.2)
Total Protein: 8.2 g/dL — ABNORMAL HIGH (ref 6.5–8.1)

## 2023-11-27 LAB — LIPASE, BLOOD: Lipase: 20 U/L (ref 11–51)

## 2023-11-27 LAB — CBC
HCT: 41.5 % (ref 36.0–46.0)
Hemoglobin: 14.5 g/dL (ref 12.0–15.0)
MCH: 30.9 pg (ref 26.0–34.0)
MCHC: 34.9 g/dL (ref 30.0–36.0)
MCV: 88.5 fL (ref 80.0–100.0)
Platelets: 340 K/uL (ref 150–400)
RBC: 4.69 MIL/uL (ref 3.87–5.11)
RDW: 12.3 % (ref 11.5–15.5)
WBC: 10.5 K/uL (ref 4.0–10.5)
nRBC: 0 % (ref 0.0–0.2)

## 2023-11-27 MED ORDER — IOHEXOL 300 MG/ML  SOLN
100.0000 mL | Freq: Once | INTRAMUSCULAR | Status: AC | PRN
  Administered 2023-11-27: 100 mL via INTRAVENOUS

## 2023-11-27 NOTE — Progress Notes (Signed)
 Subjective:    Patient ID:  Charlene Griffin is a 33 y.o. female  History is provided by the patient  Chief Complaint: Chief Complaint  Patient presents with   Abdominal Pain    X 2 days left lower quadrant pain radiates around to back. Vomiting and headache. Sharp right side pain under ribs.     History of Present Illness This is a 33 year old female with a history of PCOS, diverticulosis, and a left oophorectomy presenting with abdominal pain.  The patient began experiencing persistent abdominal pain on the night of 11/25/2023, which has progressively worsened. The pain is localized below the right rib cage and intensifies with deep breaths, leading her to adopt shallow breathing. She also reported a headache that started on 11/26/2023 around 6:00 PM. Despite attempts to alleviate the pain through sleep, there has been no improvement. She experienced three episodes of vomiting on 11/26/2023 afternoon, but none since then. Her bowel movements have been regular, with one on 11/26/2023 and another on 11/27/2023, without any signs of constipation or diarrhea. She reports no presence of blood in her stool or vomit. She has not had any gallbladder or liver issues in the past. She has been monitoring her temperature daily due to occasional clamminess but has not recorded any fevers. She experiences back pain that radiates up to the bottom of her rib, which is more pronounced during movement and has been disrupting her sleep since 11/25/2023. She rates her pain as 6 out of 10 at its worst, and 4.5 to 5 out of 10 when standing. The pain is absent when she is seated. She reports no injuries, abnormal discharge, or concerns about STD exposure. She also experienced an episode of diverticulitis in 03/2023. She did not have a fever during her previous episode of diverticulitis.  She is currently on birth control pills. She took aspirin last night for her headache, which provided some relief.  PAST  SURGICAL HISTORY: Left oophorectomy  FAMILY HISTORY She is not aware of any family history of gallbladder issues.   Past medical, surgical and family history reviewed.    Review of Systems  Constitutional:  Negative for fever.  Cardiovascular:  Negative for chest pain.  Gastrointestinal:  Positive for abdominal pain, nausea and vomiting. Negative for blood in stool, constipation and diarrhea.  Genitourinary:  Negative for dysuria and flank pain.      Objective:   Vitals:   11/27/23 0930  BP: 102/63  BP Location: Left Upper Arm  Pulse: 96  Resp: 18  Temp: 97.9 F (36.6 C)  TempSrc: Oral  SpO2: 97%  Weight: 168 lb 3.2 oz (76.3 kg)  Height: 5' 6 (1.676 m)    Physical Exam Vitals and nursing note reviewed.  Constitutional:      General: She is not in acute distress.    Appearance: Normal appearance. She is not ill-appearing.  Cardiovascular:     Rate and Rhythm: Normal rate and regular rhythm.  Pulmonary:     Effort: Pulmonary effort is normal.  Abdominal:     General: Abdomen is flat. Bowel sounds are normal.     Palpations: Abdomen is soft.     Tenderness: There is abdominal tenderness (LLQ most tender. RUQ mildly tender with positive Murphy sign.). There is no right CVA tenderness, left CVA tenderness, guarding or rebound.     Comments: + LLQ pain on heel jump.  Skin:    Findings: No erythema.  Neurological:     Mental Status: She is  alert.      Assessment:    Results of tests available at time of visit: POCT urine pregnancy test [[id number]]  Resulted: 11/27/23 0950, Result status: Final result   Resulted by: KS Resulting lab: [facility]    Components    Component Value Reference Range Flag  HCG Negative Negative --  [facility] Internal Control Acceptable -- --           POC Urine Dipstick [[id number]] (Abnormal)  Resulted: 11/27/23 0948, Result status: Final result   Resulted by: KS Resulting lab: [facility]    Components    Component Value Reference Range Flag  Color Yellow Yellow A*  Clarity Clear Clear --  Glucose,Urine Negative Negative mg/dL --  Bilirubin Negative Negative --  Ketones,Urine Negative Negative mg/dL --  Specific Gravity 8.984 1.005, 1.010, 1.015, 1.020, 1.025, 1.030 --  Blood Negative Negative --  pH 7.5 -- --  Protein Negative Negative mg/dL --  Urobilinogen 0.2 0.2, 1.0 EU/dL --  Nitrite Negative Negative --  Leukocyte Esterase Negative Negative --               Plan:   1. Left lower quadrant abdominal pain   2. RUQ pain   3. Urine pregnancy test negative     Orders Placed This Encounter  Procedures   [facility] REFERRAL TO ED   POC Urine Dipstick   POCT urine pregnancy test    Shirly was seen today for abdominal pain.  Diagnoses and all orders for this visit:  Left lower quadrant abdominal pain -     POC Urine Dipstick -     POCT urine pregnancy test -     [facility] REFERRAL TO ED; Future  RUQ pain -     [facility] REFERRAL TO ED; Future  Urine pregnancy test negative     Assessment & Plan Initial Assessment: 33 year old female with persistent abdominal pain since Monday night, worsening with sharp pain below the ribs on the right side, nausea, and vomiting. Pain exacerbated by deep breaths and movement. Tenderness in the left lower quadrant and a positive Murphy sign indicating possible gallbladder inflammation. Positive peritoneal sign suggests inflammation of the abdominal wall.  Differential Diagnosis: - Diverticulitis: History of diverticulosis and previous diverticulitis. No abnormal bowel movements or fever. Further evaluation needed. - Cholecystitis: Positive Murphy sign, gallbladder inflammation suspected. Requires immediate evaluation.  Urgent Care Course: - Physical examination performed. - Positive Murphy sign noted. - Positive peritoneal sign observed.  Final Assessment: Physical examination  revealed tenderness and positive signs indicating possible gallbladder inflammation and abdominal wall inflammation. Immediate evaluation at the emergency room recommended.  Clinical Impression: - Diverticulitis - Cholecystitis  Disposition: - Follow-Up: Immediate evaluation at the emergency room. POV transfer driven by self. Vitals normal and stable for POV transfer.  Patient Education: Visit summary provided for presentation at the ER.   The patient indicates understanding of these issues and agrees with the plan. Bernardino ONEIDA Schiller, PA-C    Computer technology was used to create visit note. Consent from the patient/care giver was obtained prior to its use. Text above is via voice recognition transcription. - there may be typographical errors.  Patient Instructions  Your urine test was negative for blood or UTI.  You are going to [facility] for further evaluation.  I cannot rule out diverticulitis or cholecystitis in the urgent care setting.  He had a positive Murphy sign and positive  peritoneal sign with left lower quadrant pain on heel jump.  You have a recent history of diverticulitis this year.    Please go straight there.  [address], [place], [place] [address]     *Some images could not be shown.

## 2023-11-27 NOTE — ED Provider Notes (Signed)
 [facility] Provider Note   CSN: [id number] Arrival date & time: 11/27/23  1036     Patient presents with: Abdominal Pain   Charlene Griffin is a 33 y.o. female.    Abdominal Pain Patient abdominal pain.  Has had for around 3 days.  Normal stools.  Pain worse with movement.  Left lower quadrant but now up in right upper quadrant.  Sent in from urgent care.  Has had previous oophorectomy on the left.  Has a history of celiac disease but does not feel as if she had an exposure.     Past Medical History:  Diagnosis Date   ADHD    Allergy    Alopecia    Anxiety    Bronchitis    Celiac disease    Complication of anesthesia    intubation bronchitis   GERD (gastroesophageal reflux disease)    rare   Hemorrhoids    Immune deficiency disorder    Insomnia    Major depression    Migraine with aura    Panic attack    with surgery   PCOS (polycystic ovarian syndrome)    PONV (postoperative nausea and vomiting)    Tinnitus    Umbilical hernia     Prior to Admission medications   Medication Sig Start Date End Date Taking? Authorizing Provider  albuterol  (PROVENTIL ) (2.5 MG/3ML) 0.083% nebulizer solution Inhale into the lungs as needed. 01/19/20   [provider]  atomoxetine  (STRATTERA ) 60 MG capsule Take 1 capsule (60 mg total) by mouth 2 (two) times daily with a meal. 11/07/23 01/06/24  Arfeen, Leni DASEN, MD  hydrocortisone  (ANUSOL -HC) 25 MG suppository Place 25 mg rectally. 03/29/23 03/28/24  [provider]  hydrOXYzine  (ATARAX ) 10 MG tablet Take 1-2 tablets (10-20 mg total) by mouth 3 (three) times daily as needed. Patient not taking: No sig reported 10/24/23   Arfeen, Leni DASEN, MD  hydrOXYzine  (VISTARIL ) 25 MG capsule Take 1 capsule (25 mg total) by mouth at bedtime and may repeat dose one time if needed. 11/07/23   Arfeen, Leni DASEN, MD  lamoTRIgine  (LAMICTAL ) 150 MG tablet Take 1 tablet (150 mg total) by mouth  daily. 11/07/23 01/06/24  Arfeen, Syed T, MD  lisdexamfetamine (VYVANSE ) 30 MG capsule Take 1 capsule (30 mg total) by mouth daily. Patient not taking: No sig reported 08/23/23   Arfeen, Leni DASEN, MD  methocarbamol  (ROBAXIN ) 500 MG tablet Take 1 tablet (500 mg total) by mouth every 8 (eight) hours as needed for muscle spasms. 07/26/23   Urbano Albright, MD  montelukast  (SINGULAIR ) 10 MG tablet Take 1 tablet (10 mg total) by mouth at bedtime. 11/08/20   Joesph Shaver Scales, PA-C  ondansetron  (ZOFRAN -ODT) 4 MG disintegrating tablet Take 2 mg by mouth every 6 (six) hours as needed. 09/13/22   [provider]  sertraline  (ZOLOFT ) 25 MG tablet Take 3 tablets (75 mg total) by mouth daily. 11/07/23 01/06/24  Arfeen, Leni DASEN, MD  SLYND 4 MG TABS Take 1 tablet by mouth daily.    [provider]  Suzetrigine  50 MG TABS Take 50 mg by mouth every 12 (twelve) hours as needed. Take two 50mg  capsules for your first dose at the start of a pain episode on an empty stomach, followed by 50mg  (1 capsule)  with or without food every 12 hours until pain episode is improved 07/26/23   Urbano Albright, MD  traMADol  (ULTRAM ) 50 MG tablet Take 1 tablet (50 mg  total) by mouth every 12 (twelve) hours as needed. Patient not taking: Reported on 07/26/2023 04/18/23   Urbano Albright, MD    Allergies: Botulinum toxin type a, Onabotulinumtoxina, Diphenhydramine , Diphenhydramine  hcl, Gluten meal, Hydrocodone-acetaminophen , and Onabotulinumtoxina (cosmetic)    Review of Systems  Gastrointestinal:  Positive for abdominal pain.    Updated Vital Signs BP 108/78 (BP Location: Right Arm)   Pulse (!) 104   Temp 98 F (36.7 C) (Oral)   Resp 16   SpO2 100%   Physical Exam Vitals and nursing note reviewed.  Cardiovascular:     Rate and Rhythm: Regular rhythm. Tachycardia present.  Abdominal:     Tenderness: There is abdominal tenderness.     Comments: Left lower quadrant right upper quadrant tenderness.  No  hernia palpated.  Neurological:     Mental Status: She is alert.     (all labs ordered are listed, but only abnormal results are displayed) Labs Reviewed  COMPREHENSIVE METABOLIC PANEL WITH GFR - Abnormal; Notable for the following components:      Result Value   Total Protein 8.2 (*)    All other components within normal limits  LIPASE, BLOOD  CBC    EKG: None  Radiology: CT ABDOMEN PELVIS W CONTRAST Result Date: 11/27/2023 EXAM: CT ABDOMEN AND PELVIS WITH CONTRAST 11/27/2023 12:27:39 PM TECHNIQUE: CT of the abdomen and pelvis was performed with the administration of 100 mL of iohexol  (OMNIPAQUE ) 300 MG/ML solution. Multiplanar reformatted images are provided for review. Automated exposure control, iterative reconstruction, and/or weight-based adjustment of the mA/kV was utilized to reduce the radiation dose to as low as reasonably achievable. COMPARISON: 03/27/2019 CLINICAL HISTORY: LLQ abdominal pain; also RUQ pain. Celiac history. FINDINGS: LOWER CHEST: No acute abnormality. LIVER: Two right hepatic lobe densities are noted most consistent with cysts . GALLBLADDER AND BILE DUCTS: Gallbladder is unremarkable. No biliary ductal dilatation. SPLEEN: No acute abnormality. PANCREAS: No acute abnormality. ADRENAL GLANDS: No acute abnormality. KIDNEYS, URETERS AND BLADDER: No stones in the kidneys or ureters. No hydronephrosis. No perinephric or periureteral stranding. Urinary bladder is unremarkable. GI AND BOWEL: Stomach demonstrates no acute abnormality. There is no bowel obstruction. PERITONEUM AND RETROPERITONEUM: No ascites. No free air. VASCULATURE: Aorta is normal in caliber. LYMPH NODES: No lymphadenopathy. REPRODUCTIVE ORGANS: No acute abnormality. BONES AND SOFT TISSUES: No acute osseous abnormality. No focal soft tissue abnormality. IMPRESSION: 1. No acute findings in the abdomen or pelvis. Electronically signed by: Lynwood Seip MD 11/27/2023 12:57 PM EST RP Workstation: [id number]      Procedures   Medications Ordered in the ED  iohexol  (OMNIPAQUE ) 300 MG/ML solution 100 mL (100 mLs Intravenous Contrast Given 11/27/23 1217)                                    Medical Decision Making Amount and/or Complexity of Data Reviewed Labs: ordered. Radiology: ordered.  Risk Prescription drug management.   Patient left lower quadrant and right upper quadrant tenderness.  Differential diagnoses longed include causes such as diverticulitis, cholecystitis.  History of Crohn's disease.  Blood work overall reassuring.  Will get CT scan to further evaluate.  Reviewed urgent care note  CT scan reassuring.  Negative for clear cause of the pain.  Well-appearing besides pain.  Appears stable for discharge home with outpatient follow-up.     Final diagnoses:  Abdominal pain of multiple sites    ED Discharge Orders  None          Patsey Lot, MD 11/27/23 1328

## 2023-11-27 NOTE — ED Triage Notes (Signed)
 Reports RUQ & LLQ abdominal pain X 3 days. Endorses emesis.   Sent from [facility] for evaluation  Hx of diverticulosis

## 2023-12-19 ENCOUNTER — Encounter (HOSPITAL_COMMUNITY): Payer: Self-pay | Admitting: Psychiatry

## 2023-12-19 ENCOUNTER — Telehealth (HOSPITAL_COMMUNITY): Admitting: Psychiatry

## 2023-12-19 VITALS — Wt 169.0 lb

## 2023-12-19 DIAGNOSIS — F4310 Post-traumatic stress disorder, unspecified: Secondary | ICD-10-CM | POA: Diagnosis not present

## 2023-12-19 DIAGNOSIS — F902 Attention-deficit hyperactivity disorder, combined type: Secondary | ICD-10-CM | POA: Diagnosis not present

## 2023-12-19 DIAGNOSIS — F331 Major depressive disorder, recurrent, moderate: Secondary | ICD-10-CM | POA: Diagnosis not present

## 2023-12-19 MED ORDER — ATOMOXETINE HCL 60 MG PO CAPS: 60.0000 mg | ORAL_CAPSULE | Freq: Two times a day (BID) | ORAL | 2 refills | Status: AC

## 2023-12-19 MED ORDER — SERTRALINE HCL 25 MG PO TABS: 75.0000 mg | ORAL_TABLET | Freq: Every day | ORAL | 2 refills | Status: AC

## 2023-12-19 MED ORDER — HYDROXYZINE PAMOATE 50 MG PO CAPS: 50.0000 mg | ORAL_CAPSULE | Freq: Every evening | ORAL | 2 refills | Status: AC | PRN

## 2023-12-19 MED ORDER — LAMOTRIGINE 150 MG PO TABS: 150.0000 mg | ORAL_TABLET | Freq: Every day | ORAL | 2 refills | Status: AC
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
## 2024-02-05 ENCOUNTER — Other Ambulatory Visit (HOSPITAL_BASED_OUTPATIENT_CLINIC_OR_DEPARTMENT_OTHER): Payer: Self-pay

## 2024-02-05 ENCOUNTER — Ambulatory Visit (HOSPITAL_BASED_OUTPATIENT_CLINIC_OR_DEPARTMENT_OTHER): Admitting: Orthopaedic Surgery

## 2024-02-05 ENCOUNTER — Ambulatory Visit (HOSPITAL_BASED_OUTPATIENT_CLINIC_OR_DEPARTMENT_OTHER): Payer: Self-pay | Admitting: Orthopaedic Surgery

## 2024-02-05 DIAGNOSIS — S43431S Superior glenoid labrum lesion of right shoulder, sequela: Secondary | ICD-10-CM

## 2024-02-05 MED ORDER — OXYCODONE HCL 5 MG PO TABS
5.0000 mg | ORAL_TABLET | ORAL | 0 refills | Status: AC | PRN
  Filled 2024-02-05: qty 20, 4d supply, fill #0

## 2024-02-05 MED ORDER — ASPIRIN 325 MG PO TBEC
325.0000 mg | DELAYED_RELEASE_TABLET | Freq: Every day | ORAL | 0 refills | Status: AC
  Filled 2024-02-05: qty 14, 14d supply, fill #0
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
## 2024-03-11 ENCOUNTER — Telehealth (HOSPITAL_COMMUNITY): Admitting: Psychiatry

## 2024-03-27 ENCOUNTER — Ambulatory Visit: Admitting: Physical Medicine & Rehabilitation
